# Patient Record
Sex: Female | Born: 1945
Health system: Southern US, Community
[De-identification: ages and names within clinical notes are randomized; demographics above are authoritative.]

## PROBLEM LIST (undated history)

## (undated) DIAGNOSIS — E119 Type 2 diabetes mellitus without complications: Secondary | ICD-10-CM

## (undated) DIAGNOSIS — D649 Anemia, unspecified: Secondary | ICD-10-CM

## (undated) DIAGNOSIS — H269 Unspecified cataract: Secondary | ICD-10-CM

## (undated) DIAGNOSIS — K219 Gastro-esophageal reflux disease without esophagitis: Secondary | ICD-10-CM

## (undated) DIAGNOSIS — T7840XA Allergy, unspecified, initial encounter: Secondary | ICD-10-CM

## (undated) DIAGNOSIS — Z86718 Personal history of other venous thrombosis and embolism: Secondary | ICD-10-CM

## (undated) DIAGNOSIS — K449 Diaphragmatic hernia without obstruction or gangrene: Secondary | ICD-10-CM

## (undated) DIAGNOSIS — R079 Chest pain, unspecified: Secondary | ICD-10-CM

## (undated) DIAGNOSIS — E785 Hyperlipidemia, unspecified: Secondary | ICD-10-CM

## (undated) DIAGNOSIS — R002 Palpitations: Secondary | ICD-10-CM

## (undated) DIAGNOSIS — I499 Cardiac arrhythmia, unspecified: Secondary | ICD-10-CM

## (undated) DIAGNOSIS — I10 Essential (primary) hypertension: Secondary | ICD-10-CM

## (undated) HISTORY — PX: EYE SURGERY: SHX253

## (undated) HISTORY — DX: Unspecified cataract: H26.9

## (undated) HISTORY — PX: APPENDECTOMY: SHX54

## (undated) HISTORY — DX: Anemia, unspecified: D64.9

## (undated) HISTORY — PX: VAGINAL HYSTERECTOMY: SUR661

## (undated) HISTORY — DX: Essential (primary) hypertension: I10

## (undated) HISTORY — DX: Chest pain, unspecified: R07.9

## (undated) HISTORY — PX: ROOT CANAL: SHX2363

## (undated) HISTORY — DX: Palpitations: R00.2

## (undated) HISTORY — DX: Hyperlipidemia, unspecified: E78.5

## (undated) HISTORY — DX: Allergy, unspecified, initial encounter: T78.40XA

## (undated) HISTORY — PX: CATARACT EXTRACTION, BILATERAL: SHX1313

## (undated) HISTORY — DX: Diaphragmatic hernia without obstruction or gangrene: K44.9

## (undated) HISTORY — PX: ABDOMINAL EXPLORATION SURGERY: SHX538

## (undated) HISTORY — DX: Personal history of other venous thrombosis and embolism: Z86.718

## (undated) HISTORY — DX: Gastro-esophageal reflux disease without esophagitis: K21.9

## (undated) HISTORY — DX: Type 2 diabetes mellitus without complications: E11.9

## (undated) HISTORY — DX: Cardiac arrhythmia, unspecified: I49.9

---

## 1998-02-03 ENCOUNTER — Other Ambulatory Visit: Admission: RE | Admit: 1998-02-03 | Discharge: 1998-02-03 | Payer: Self-pay | Admitting: Obstetrics and Gynecology

## 1999-02-08 ENCOUNTER — Other Ambulatory Visit: Admission: RE | Admit: 1999-02-08 | Discharge: 1999-02-08 | Payer: Self-pay | Admitting: Obstetrics and Gynecology

## 2000-03-13 ENCOUNTER — Other Ambulatory Visit: Admission: RE | Admit: 2000-03-13 | Discharge: 2000-03-13 | Payer: Self-pay | Admitting: Obstetrics and Gynecology

## 2001-04-02 ENCOUNTER — Other Ambulatory Visit: Admission: RE | Admit: 2001-04-02 | Discharge: 2001-04-02 | Payer: Self-pay | Admitting: Obstetrics and Gynecology

## 2003-07-02 ENCOUNTER — Encounter: Payer: Self-pay | Admitting: Cardiovascular Disease

## 2008-12-11 ENCOUNTER — Ambulatory Visit (HOSPITAL_COMMUNITY): Admission: RE | Admit: 2008-12-11 | Discharge: 2008-12-11 | Payer: Self-pay | Admitting: Gastroenterology

## 2009-02-21 ENCOUNTER — Emergency Department (HOSPITAL_COMMUNITY): Admission: EM | Admit: 2009-02-21 | Discharge: 2009-02-21 | Payer: Self-pay | Admitting: Emergency Medicine

## 2009-02-21 ENCOUNTER — Encounter: Payer: Self-pay | Admitting: Cardiovascular Disease

## 2009-02-23 ENCOUNTER — Encounter: Payer: Self-pay | Admitting: Cardiovascular Disease

## 2010-04-14 ENCOUNTER — Encounter: Payer: Self-pay | Admitting: Cardiovascular Disease

## 2010-04-14 ENCOUNTER — Ambulatory Visit
Admission: RE | Admit: 2010-04-14 | Discharge: 2010-04-14 | Payer: Self-pay | Source: Home / Self Care | Attending: Cardiovascular Disease | Admitting: Cardiovascular Disease

## 2010-04-14 DIAGNOSIS — L2989 Other pruritus: Secondary | ICD-10-CM | POA: Insufficient documentation

## 2010-04-14 DIAGNOSIS — L298 Other pruritus: Secondary | ICD-10-CM | POA: Insufficient documentation

## 2010-04-14 DIAGNOSIS — E785 Hyperlipidemia, unspecified: Secondary | ICD-10-CM | POA: Insufficient documentation

## 2010-04-15 ENCOUNTER — Encounter: Payer: Self-pay | Admitting: Cardiovascular Disease

## 2010-04-15 ENCOUNTER — Ambulatory Visit: Admission: RE | Admit: 2010-04-15 | Discharge: 2010-04-15 | Payer: Self-pay | Source: Home / Self Care

## 2010-04-20 LAB — CONVERTED CEMR LAB
Albumin: 4.3 g/dL (ref 3.5–5.2)
Alkaline Phosphatase: 62 units/L (ref 39–117)
HDL: 39 mg/dL — ABNORMAL LOW (ref >39)
Total Bilirubin: 0.5 mg/dL (ref 0.3–1.2)

## 2010-04-29 NOTE — Assessment & Plan Note (Signed)
Summary: NP6/AMD   Visit Type:  Initial Consult Referring Provider:  Dr. Elsie Lincoln  CC:  Establish care with local cardilogist.  Has shortness of breath when walking up a hill.  Has a lot of itching; not sure if coming from medication.  Former patient at [facility].Marland Kitchen  History of Present Illness: Dawn Herring is a very pleasant 65 year old woman with past medical history of hypertension, hyperlipidemia, with remote history of chest pain and tachycardia palpitations, last seen by myself in November 2010 at [facility], who presents to reestablish care.  Her main reason for presenting today is for new symptoms of itching. she believes it could be one of her medications. She does not have any new medications and has been on the same regimen for one year. She has not been taking any cholesterol medication despite a cholesterol typically greater than 300. She wonders if it is a "bad batch "of one of her generic medications.  She denies any shortness of breath, chest pain, lightheadedness. She does some exercise but would not a regular regimen. She continues to battle with her weight.  EKG shows sinus bradycardia with rate 53 beats per minute, diffuse T wave abnormality in the anterior precordial leads, one and aVL  Preventive Screening-Counseling & Management  Alcohol-Tobacco     Smoking Status: never  Caffeine-Diet-Exercise     Does Patient Exercise: no      Drug Use:  no.    Current Medications (verified): 1)  Aspir-Low 81 Mg Tbec (Aspirin) .... One Tablet Once Daily 2)  Losartan Potassium-Hctz 100-25 Mg Tabs (Losartan Potassium-Hctz) .... One Tablet Once Daily 3)  Metoprolol Tartrate 25 Mg Tabs (Metoprolol Tartrate) .... One Tablet Once Daily 4)  Acidophilus  Caps (Lactobacillus) .... One Tablet Once Daily 5)  Dexilant 60 Mg Cpdr (Dexlansoprazole) .... One Tablet Once Daily  Allergies (verified): 1)  ! * Statins  Past History:  Family History: Last updated:  04/14/2010 Father: Mother:  Social History: Last updated: 04/14/2010 Married  Retired  Tobacco Use - No.  Alcohol Use - no Regular Exercise - no Drug Use - no  Risk Factors: Exercise: no (04/14/2010)  Risk Factors: Smoking Status: never (04/14/2010)  Past Medical History: Hyperlipidemia Hypertension  Past Surgical History: hysterectomy  Family History: Father: Mother:  Social History: Married  Retired  Tobacco Use - No.  Alcohol Use - no Regular Exercise - no Drug Use - no Smoking Status:  never Does Patient Exercise:  no Drug Use:  no  Review of Systems  The patient denies fever, weight loss, weight gain, vision loss, decreased hearing, hoarseness, chest pain, syncope, dyspnea on exertion, peripheral edema, prolonged cough, abdominal pain, incontinence, muscle weakness, depression, and enlarged lymph nodes.         itching  Vital Signs:  Patient profile:   65 year old female Height:      66 inches Weight:      191 pounds BMI:     30.94 Pulse rate:   53 / minute BP sitting:   126 / 77  (left arm) Cuff size:   regular  Vitals Entered By: Bishop Dublin, CMA (April 14, 2010 3:24 PM)  Physical Exam  General:  Well developed, well nourished, in no acute distress. Head:  normocephalic and atraumatic Neck:  Neck supple, no JVD. No masses, thyromegaly or abnormal cervical nodes. Lungs:  Clear bilaterally to auscultation and percussion. Heart:  Non-displaced PMI, chest non-tender; regular rate and rhythm, S1, S2 without murmurs, rubs or  gallops. Carotid upstroke normal, no bruit.  Pedals normal pulses. No edema, no varicosities. Abdomen:  Bowel sounds positive; abdomen soft and non-tender without masses, obese Msk:  Back normal, normal gait. Muscle strength and tone normal. Pulses:  pulses normal in all 4 extremities Extremities:  No clubbing or cyanosis. Neurologic:  Alert and oriented x 3. Skin:  Intact without lesions or rashes. Psych:  Normal  affect.   Impression & Recommendations:  Problem # 1:  OTHER SPECIFIED PRURITIC CONDITIONS (ICD-698.8) she is concerned that her itching is coming from one of her medications. We have suggested that she hold her metoprolol and take this p.r.n. for tachycardia palpitations. We have also given her samples of Diovan 160/25 mg daily for blood pressure in place of losartan HCT. If she continues to have itching with both of these changes, her itching could be from an alternate cause that is not medication related.  Problem # 2:  HYPERLIPIDEMIA (ICD-272.4) We have given her an order slip to have her cholesterol checked at her convenience. She has suggested trying lovastatin as her mother tolerated this adequately. She has not tolerated Lipitor or Crestor in the past.  Orders: T-Hepatic Function [id number]) T-Lipid Profile [phone])  Patient Instructions: 1)  Your physician recommends that you schedule a follow-up appointment in: 1 year 2)  Your physician has recommended you make the following change in your medication: CHANGE Metoprolol to as needed only.  3)  Call our office to notify if samples of Diovan will work and we will call in prescription. Prescriptions: DIOVAN HCT 160-25 MG TABS (VALSARTAN-HYDROCHLOROTHIAZIDE) Take one tablet by mouth once daily.  #28 x 0   Entered by:   Lanny Hurst RN   Authorized by:   Dossie Arbour MD   Signed by:   Lanny Hurst RN on 04/14/2010   Method used:   Samples Given   RxID:   [id number]   Appended Document: NP6/AMD Echocardiogram from 2005 is essentially normal.  Stress test April 2005 shows no ischemia. This was a Persantine study

## 2010-05-05 NOTE — Letter (Signed)
Summary: [facility] 2D Echo  [facility] 2D Echo   Imported By: Roderic Ovens 04/30/2010 15:50:29  _____________________________________________________________________  External Attachment:    Type:   Image     Comment:   External Document

## 2010-05-05 NOTE — Letter (Signed)
Summary: [facility] Office Note   [facility] Note   Imported By: Roderic Ovens 04/30/2010 15:51:21  _____________________________________________________________________  External Attachment:    Type:   Image     Comment:   External Document

## 2010-05-10 ENCOUNTER — Telehealth: Payer: Self-pay | Admitting: Cardiovascular Disease

## 2010-05-19 NOTE — Progress Notes (Signed)
Summary: Diovan Rx  Phone Note Call from Patient   Caller: Patient Summary of Call: Pt called stating the Diovan HCT 160-25mg  samples worked well and pt needs rx.     Prescriptions: DIOVAN HCT 160-25 MG TABS (VALSARTAN-HYDROCHLOROTHIAZIDE) Take one tablet by mouth once daily.  #30 x 6   Entered by:   Lanny Hurst RN   Authorized by:   Dossie Arbour MD   Signed by:   Lanny Hurst RN on 05/10/2010   Method used:   Electronically to        [facility] [phone]* (retail)       [address]       [place], [place]  [address]       Ph: [phone]       Fax: [phone]   RxID:   [id number]

## 2010-06-11 ENCOUNTER — Telehealth: Payer: Self-pay | Admitting: Cardiovascular Disease

## 2010-06-14 ENCOUNTER — Telehealth: Payer: Self-pay | Admitting: Cardiovascular Disease

## 2010-06-15 ENCOUNTER — Telehealth: Payer: Self-pay | Admitting: Cardiovascular Disease

## 2010-06-15 ENCOUNTER — Other Ambulatory Visit: Payer: Self-pay

## 2010-06-15 NOTE — Telephone Encounter (Signed)
Phone Note  Call from Patient Call back at [facility] Phone [phone]   Caller: Self Call For: Gollan Summary of Call: Pt is calling back about her previous message regarding medication.  It appears that Mariah Milling has responded and signed without sending to [place].  Please advise. Initial call taken by: Harlon Flor,  June 14, 2010 10:11 AM  Follow-up for Phone Call         LMOM TCB. Follow-up by: Bishop Dublin, CMA,  June 14, 2010 3:32 PM

## 2010-06-15 NOTE — Telephone Encounter (Signed)
The patient needs to start on Micardis, avapro or twynsta before authorized to take diovan hct.  The patient needs to have failed two other medications before approval for diovan. Please advise which of the three above medications to order. Even though the patient tried generic losartan the insurance still will not approve the diovan hct.

## 2010-06-16 ENCOUNTER — Telehealth: Payer: Self-pay

## 2010-06-16 MED ORDER — TELMISARTAN-HCTZ 40-12.5 MG PO TABS: 1.0000 | ORAL_TABLET | Freq: Every day | ORAL | Status: DC

## 2010-06-16 NOTE — Telephone Encounter (Signed)
Addended by: Bishop Dublin on: 06/16/2010 03:06 PM   Modules accepted: Orders

## 2010-06-16 NOTE — Telephone Encounter (Signed)
Notified patient waiting to hear back from Dr. Mariah Milling on which medication he would like to start her on since the patient needs to have tried either micardis,avapro or twynsta since diovan not authorized.

## 2010-06-16 NOTE — Telephone Encounter (Signed)
Would try micardis-HCT 40-25 mg daily instead of diovan HCT

## 2010-06-16 NOTE — Telephone Encounter (Signed)
Addended by: Bishop Dublin on: 06/16/2010 05:30 PM   Modules accepted: Orders

## 2010-06-16 NOTE — Telephone Encounter (Signed)
PATIENT CALLED BACK AND LEFT MESSAGE ON VOICEMAIL FOR SHARON, PATIENT TALKED WITH INSURANCE COMPANY AND THEY DENIED DIOVAN.  THEY WOULD LIKE HER TO TRY TWO OTHER DRUGS BEFORE THEY WILL APPROVE. PLEASE GIVE HER A CALL AT [phone]

## 2010-06-18 NOTE — Telephone Encounter (Signed)
Dawn Herring, was this already taken care of? Or do I need to talk to call pt back? Thanks, Magazine features editor.

## 2010-06-24 NOTE — Progress Notes (Signed)
Summary: RX  Phone Note Call from Patient Call back at Home Phone [phone]   Caller: Self Call For: Dawn Herring Summary of Call: Pt needs the insurance company to be notified that it is medically necessary to be on Diovan.  RX company will not refill it if we do not notify them of this.  Pt states that she faxed over a form several days ago to take care of this and the insurance company states that they have not r'cvd it.  Please call [facility] @ [phone].  Please call the pt once this has been completed. Initial call taken by: Harlon Flor,  June 11, 2010 2:52 PM  Follow-up for Phone Call        Uncertain if her previous rash/itch was from losartan? Does she want to try again with losartan? If not, we can try to put paperwork through to see if it will be covered.

## 2010-06-24 NOTE — Progress Notes (Signed)
Summary: Message  Phone Note Call from Patient Call back at Home Phone [phone]   Caller: Self Call For: Dawn Herring Summary of Call: Pt is calling back about her previous message regarding medication.  It appears that Mariah Milling has responded and signed without sending to [place].  Please advise. Initial call taken by: Harlon Flor,  June 14, 2010 10:11 AM  Follow-up for Phone Call        LMOM TCB. Follow-up by: Bishop Dublin, CMA,  June 14, 2010 3:32 PM  Additional Follow-up for Phone Call Additional follow up Details #1::        Phone note copied and sent to epic, will complete note in epic. Lanny Hurst RN  June 16, 2010 12:31 PM

## 2010-06-30 LAB — COMPREHENSIVE METABOLIC PANEL
Alkaline Phosphatase: 75 U/L (ref 39–117)
BUN: 11 mg/dL (ref 6–23)
Chloride: 99 mEq/L (ref 96–112)
GFR calc non Af Amer: >60 mL/min (ref >60)
Glucose, Bld: 112 mg/dL — ABNORMAL HIGH (ref 70–99)
Potassium: 3.4 mEq/L — ABNORMAL LOW (ref 3.5–5.1)
Total Bilirubin: 0.6 mg/dL (ref 0.3–1.2)
Total Protein: 7.4 g/dL (ref 6.0–8.3)

## 2010-06-30 LAB — DIFFERENTIAL
Basophils Absolute: 0 10*3/uL (ref 0.0–0.1)
Basophils Relative: 0 % (ref 0–1)
Neutro Abs: 3 10*3/uL (ref 1.7–7.7)
Neutrophils Relative %: 58 % (ref 43–77)

## 2010-06-30 LAB — URINALYSIS, ROUTINE W REFLEX MICROSCOPIC
Bilirubin Urine: NEGATIVE
Glucose, UA: NEGATIVE mg/dL
Ketones, ur: NEGATIVE mg/dL
pH: 7 (ref 5.0–8.0)

## 2010-06-30 LAB — POCT CARDIAC MARKERS
CKMB, poc: 2.1 ng/mL (ref 1.0–8.0)
Myoglobin, poc: 80.2 ng/mL (ref 12–200)
Troponin i, poc: <0.05 ng/mL (ref 0.00–0.09)

## 2010-06-30 LAB — CBC
HCT: 39.9 % (ref 36.0–46.0)
Hemoglobin: 13.9 g/dL (ref 12.0–15.0)
RDW: 12.8 % (ref 11.5–15.5)

## 2010-06-30 LAB — PROTIME-INR: INR: 0.89 (ref 0.00–1.49)

## 2010-07-20 ENCOUNTER — Other Ambulatory Visit (INDEPENDENT_AMBULATORY_CARE_PROVIDER_SITE_OTHER): Payer: Medicare Other | Admitting: *Deleted

## 2010-07-20 DIAGNOSIS — E785 Hyperlipidemia, unspecified: Secondary | ICD-10-CM

## 2010-07-20 LAB — LIPID PANEL
HDL: 45 mg/dL (ref >39)
Triglycerides: 308 mg/dL — ABNORMAL HIGH (ref <150)

## 2010-07-20 LAB — HEPATIC FUNCTION PANEL
AST: 19 U/L (ref 0–37)
Albumin: 4.1 g/dL (ref 3.5–5.2)
Alkaline Phosphatase: 58 U/L (ref 39–117)
Indirect Bilirubin: 0.4 mg/dL (ref 0.0–0.9)
Total Bilirubin: 0.5 mg/dL (ref 0.3–1.2)

## 2010-07-28 ENCOUNTER — Encounter: Payer: Self-pay | Admitting: Cardiovascular Disease

## 2010-09-03 ENCOUNTER — Encounter: Payer: Self-pay | Admitting: Cardiovascular Disease

## 2010-09-03 ENCOUNTER — Ambulatory Visit (INDEPENDENT_AMBULATORY_CARE_PROVIDER_SITE_OTHER): Payer: Medicare Other | Admitting: Cardiovascular Disease

## 2010-09-03 DIAGNOSIS — L298 Other pruritus: Secondary | ICD-10-CM

## 2010-09-03 DIAGNOSIS — E669 Obesity, unspecified: Secondary | ICD-10-CM

## 2010-09-03 DIAGNOSIS — R9431 Abnormal electrocardiogram [ECG] [EKG]: Secondary | ICD-10-CM | POA: Insufficient documentation

## 2010-09-03 DIAGNOSIS — I10 Essential (primary) hypertension: Secondary | ICD-10-CM

## 2010-09-03 DIAGNOSIS — E785 Hyperlipidemia, unspecified: Secondary | ICD-10-CM | POA: Insufficient documentation

## 2010-09-03 NOTE — Progress Notes (Signed)
   Patient ID: Dawn Herring, female    DOB: June 10, 1945, 65 y.o.   MRN: [id number]  HPI Comments: Dawn Herring is a very pleasant 65 year old woman with past medical history of hypertension, hyperlipidemia, with remote history of chest pain and tachycardia palpitations, Previous stress test in 2005, also in 2008 or 2009 while at [facility] vascular [facility], Who presents for routine followup   She reports that she was recently denied from long-term care insurance. She was noted to have ST and T wave abnormality in the anterolateral leads. This was noted on previous EKGs dating back numerous years. She denies any recent symptoms of shortness of breath or chest discomfort. She had this EKG change at the time of a stress test in the past.    She does some exercise but would not a regular regimen. She continues to battle with her weight.   EKG shows sinus bradycardia with rate 74 beats per minute, diffuse T wave abnormality in the anterolateral V4 to V6, leads, one and aVL      Review of Systems  Constitutional: Negative.   HENT: Negative.   Eyes: Negative.   Respiratory: Negative.   Cardiovascular: Negative.   Gastrointestinal: Negative.   Musculoskeletal: Negative.   Skin: Negative.   Neurological: Negative.   Hematological: Negative.   Psychiatric/Behavioral: Negative.   All other systems reviewed and are negative.    BP 131/84  Pulse 74  Ht 5\' 7"  (1.702 m)  Wt 189 lb (85.73 kg)  BMI 29.60 kg/m2  Physical Exam  Nursing note and vitals reviewed. Constitutional: She is oriented to person, place, and time. She appears well-developed and well-nourished.       obese  HENT:  Head: Normocephalic.  Nose: Nose normal.  Mouth/Throat: Oropharynx is clear and moist.  Eyes: Conjunctivae are normal. Pupils are equal, round, and reactive to light.  Neck: Normal range of motion. Neck supple. No JVD present.  Cardiovascular: Normal rate, regular rhythm, S1 normal, S2 normal and  intact distal pulses.  Exam reveals no gallop and no friction rub.   Murmur heard.  Crescendo systolic murmur is present with a grade of 1/6  Pulmonary/Chest: Effort normal and breath sounds normal. No respiratory distress. She has no wheezes. She has no rales. She exhibits no tenderness.  Abdominal: Soft. Bowel sounds are normal. She exhibits no distension. There is no tenderness.  Musculoskeletal: Normal range of motion. She exhibits no edema and no tenderness.  Lymphadenopathy:    She has no cervical adenopathy.  Neurological: She is alert and oriented to person, place, and time. Coordination normal.  Skin: Skin is warm and dry. No rash noted. No erythema.  Psychiatric: She has a normal mood and affect. Her behavior is normal. Judgment and thought content normal.         Assessment and Plan

## 2010-09-03 NOTE — Patient Instructions (Addendum)
You are doing well. Try red yeast rice Please call us if you have new issues that need to be addressed before your next appt.  We will call you for a follow up Appt. In 12 months

## 2010-09-05 DIAGNOSIS — E669 Obesity, unspecified: Secondary | ICD-10-CM | POA: Insufficient documentation

## 2010-09-05 DIAGNOSIS — I10 Essential (primary) hypertension: Secondary | ICD-10-CM | POA: Insufficient documentation

## 2010-09-05 NOTE — Assessment & Plan Note (Addendum)
She has had a difficult time tolerating statins. We have asked her to work on her weight. Currently she does not take lovastatin secondary to muscle ache. We will discuss with her whether she would like to try red yeast rice with zetia.

## 2010-09-05 NOTE — Assessment & Plan Note (Signed)
She was unable to tolerate losartan in the past secondary to possible itching. She is tolerating my card is better though does continue to have mild itching.

## 2010-09-05 NOTE — Assessment & Plan Note (Addendum)
She does have an abnormal EKG though this has been noted for numerous years, even during the time when she had a normal stress test that showed no ischemia. She did have a stress test in 2005. She reports having one after that but we do not have this for our records.  We will try to obtain the old EKGs for verification.

## 2010-09-05 NOTE — Assessment & Plan Note (Signed)
We have encouraged continued exercise, careful diet management in an effort to lose weight. 

## 2010-09-07 ENCOUNTER — Telehealth: Payer: Self-pay | Admitting: *Deleted

## 2010-09-07 NOTE — Telephone Encounter (Signed)
Called pt to let her know per Dr. Mariah Milling her TC was >300. She has had muscle ache with lovastatin in past, and does she want to try other medication? Dr. Mariah Milling recc Zetia and Red Yeast Rice. LMOM TCB.

## 2010-09-10 ENCOUNTER — Other Ambulatory Visit: Payer: Self-pay | Admitting: Cardiovascular Disease

## 2010-09-10 MED ORDER — RED YEAST RICE 600 MG PO CAPS: 1.0000 | ORAL_CAPSULE | Freq: Every day | ORAL | Status: DC

## 2010-09-10 MED ORDER — EZETIMIBE 10 MG PO TABS: 10.0000 mg | ORAL_TABLET | Freq: Every day | ORAL | Status: DC

## 2010-09-10 NOTE — Telephone Encounter (Signed)
Spoke to pt, notified of msg below. She will start Zetia along with Red Yeast Rice. Have sent in Rx. Pt will call with any problems after taking med.

## 2010-09-14 ENCOUNTER — Telehealth: Payer: Self-pay | Admitting: Cardiovascular Disease

## 2010-09-14 NOTE — Telephone Encounter (Signed)
Pt is applying for Long Term [employer] and was denied bc of an old EKG.  Dawn Herring has stated that he would be willing to write a letter to the company in regards to this EKG.  AttnElliot Dally Fax # [phone]. Please call the pt in regards to this.

## 2010-09-15 NOTE — Telephone Encounter (Signed)
Attempted to contact pt, LMOM TCB. Dr. Mariah Milling, please advise.

## 2010-09-16 NOTE — Telephone Encounter (Signed)
We have pt's stress thallium from 2005 from [facility] cardiology. Pt does want Dr. Mariah Milling to write letter to insurance. Notified pt will discuss with him after he returns to office. Pt fine with this.

## 2010-09-28 ENCOUNTER — Telehealth: Payer: Self-pay | Admitting: Cardiovascular Disease

## 2010-09-28 NOTE — Telephone Encounter (Signed)
The insurance company is calling in regards to the letter that was requested on 09/14/10 discussing an EKG that caused the patient to be denied for long term insurance.  I explained to the insurance agent that the letter was first requested on 6/19 and that Dr. Mariah Milling was off for a week since that was requested.  The agent proceeded to argue with me about when the letter was requested, I only saw a phone note regarding this on 6/19.  The agent then argued that Dr. Mariah Milling has been back from vacation for 1 week and does not understand why the letter has still not been written.  Please advise the patient as to when this letter will be complete and please fax to [place] at [employer].  The fax number was included in a previous phone note.

## 2010-09-28 NOTE — Telephone Encounter (Signed)
Dr. Mariah Milling, please advise, and I can call pt letting her know if we are going to send letter. Thanks.

## 2010-09-29 ENCOUNTER — Encounter: Payer: Self-pay | Admitting: Cardiovascular Disease

## 2010-09-29 NOTE — Telephone Encounter (Signed)
Sorry he was so rude. Letter has been written. That will teach me for putting active patient care first and letters to insurance co. Second.  Shame on me

## 2010-09-30 NOTE — Telephone Encounter (Signed)
Pt notified, and is fine with everything. She requested letter be mailed to her, I have mailed to her and she states she will give to her insurance company.

## 2011-01-19 ENCOUNTER — Other Ambulatory Visit: Payer: Self-pay | Admitting: Cardiovascular Disease

## 2011-01-19 NOTE — Telephone Encounter (Signed)
Refill sent for micardis hct 40/12.5 mg daily.

## 2011-03-01 ENCOUNTER — Telehealth: Payer: Self-pay | Admitting: Cardiovascular Disease

## 2011-03-01 NOTE — Telephone Encounter (Signed)
Pt calling stating that she thinks she is having a reaction to one of her meds. She has been itching really bad. She is not sure which med it maybe `

## 2011-03-01 NOTE — Telephone Encounter (Signed)
Spoke to pt, she states itching and rash has lasted 3 weeks. Rash is all over, both legs/arms, back, abdomen, and is "worse at night." She has been taking Benadryl since this started and it does relieve the itching so she can sleep, but symptoms keep returning. Pt's only allergy is statins, but does state she is "sensitive to many things." She has not changed detergents or anything she comes in contact with recently that she is aware of. Cardiac meds are Micardis-

## 2011-03-01 NOTE — Telephone Encounter (Signed)
**  Continue from below** Only cardiac meds are Micardis and she has been on this for 1 year with no trouble, and takes Zetia. Everything else is OTC and nothing is new. Pt does not have PCP. I advised her to see urgent care to assess rash and help r/o cause for rxn. Pt states she will see urgent care and will call us back with any additional problems. I asked pt to call back and f/u with the results.

## 2011-03-16 ENCOUNTER — Ambulatory Visit (INDEPENDENT_AMBULATORY_CARE_PROVIDER_SITE_OTHER): Payer: Medicare Other | Admitting: Cardiovascular Disease

## 2011-03-16 ENCOUNTER — Encounter: Payer: Self-pay | Admitting: Cardiovascular Disease

## 2011-03-16 VITALS — BP 140/80 | HR 65 | Ht 67.0 in | Wt 194.0 lb

## 2011-03-16 DIAGNOSIS — E669 Obesity, unspecified: Secondary | ICD-10-CM

## 2011-03-16 DIAGNOSIS — E785 Hyperlipidemia, unspecified: Secondary | ICD-10-CM

## 2011-03-16 DIAGNOSIS — I10 Essential (primary) hypertension: Secondary | ICD-10-CM

## 2011-03-16 MED ORDER — VALSARTAN-HYDROCHLOROTHIAZIDE 160-25 MG PO TABS: 1.0000 | ORAL_TABLET | Freq: Every day | ORAL | Status: DC

## 2011-03-16 MED ORDER — FLUVASTATIN SODIUM 40 MG PO CAPS: 40.0000 mg | ORAL_CAPSULE | Freq: Every day | ORAL | Status: DC

## 2011-03-16 NOTE — Assessment & Plan Note (Signed)
We have encouraged continued exercise, careful diet management in an effort to lose weight. 

## 2011-03-16 NOTE — Progress Notes (Signed)
   Patient ID: Dawn Herring, female    DOB: 1945-08-31, 65 y.o.   MRN: [id number]  HPI Comments: Ms. Rafuse is a very pleasant 65 year old woman with past medical history of hypertension, hyperlipidemia, with remote history of chest pain and tachycardia palpitations, Previous stress test in 2005, also in 2008 or 2009 while at [facility] heart [facility] vascular [facility], Who presents for routine followup   She reports that she has had a difficult time tolerating statins. She has tried various medications and has muscle ache. She had GI upset on red yeast rice. She has been able to tolerate zetia 10 mg daily. No recent cholesterol check. Her weight continues to be a issue. Otherwise she feels well and has no complaints.   EKG shows sinus bradycardia with rate 65 beats per minute, diffuse T wave abnormality in the anterolateral V4 to V6, leads, one and aVL    Outpatient Encounter Prescriptions as of 03/16/2011  Medication Sig Dispense Refill  . aspirin 81 MG tablet Take 81 mg by mouth daily.        Marland Kitchen ezetimibe (ZETIA) 10 MG tablet Take 1 tablet (10 mg total) by mouth daily.  30 tablet  6  . Lactobacillus (ACIDOPHILUS) CAPS Take by mouth.        Marland Kitchen MICARDIS HCT 40-12.5 MG per tablet TAKE 1 TABLET BY MOUTH EVERY DAY  30 tablet  6  . omeprazole (PRILOSEC) 20 MG capsule Take 20 mg by mouth daily.          Review of Systems  Constitutional: Negative.   HENT: Negative.   Eyes: Negative.   Respiratory: Negative.   Cardiovascular: Negative.   Gastrointestinal: Negative.   Musculoskeletal: Negative.   Skin: Negative.   Neurological: Negative.   Hematological: Negative.   Psychiatric/Behavioral: Negative.   All other systems reviewed and are negative.    BP 140/80  Pulse 65  Ht 5\' 7"  (1.702 m)  Wt 194 lb (87.998 kg)  BMI 30.38 kg/m2  Physical Exam  Nursing note and vitals reviewed. Constitutional: She is oriented to person, place, and time. She appears well-developed and well-nourished.    obese  HENT:  Head: Normocephalic.  Nose: Nose normal.  Mouth/Throat: Oropharynx is clear and moist.  Eyes: Conjunctivae are normal. Pupils are equal, round, and reactive to light.  Neck: Normal range of motion. Neck supple. No JVD present.  Cardiovascular: Normal rate, regular rhythm, S1 normal, S2 normal and intact distal pulses.  Exam reveals no gallop and no friction rub.   Murmur heard.  Crescendo systolic murmur is present with a grade of 1/6  Pulmonary/Chest: Effort normal and breath sounds normal. No respiratory distress. She has no wheezes. She has no rales. She exhibits no tenderness.  Abdominal: Soft. Bowel sounds are normal. She exhibits no distension. There is no tenderness.  Musculoskeletal: Normal range of motion. She exhibits no edema and no tenderness.  Lymphadenopathy:    She has no cervical adenopathy.  Neurological: She is alert and oriented to person, place, and time. Coordination normal.  Skin: Skin is warm and dry. No rash noted. No erythema.  Psychiatric: She has a normal mood and affect. Her behavior is normal. Judgment and thought content normal.         Assessment and Plan

## 2011-03-16 NOTE — Assessment & Plan Note (Signed)
We have asked her to monitor her blood pressure at home. She is interested in changing back to Diovan now that it is generic. We will start her on Diovan HCT 160/25 mg daily. She does report slightly increased edema.

## 2011-03-16 NOTE — Patient Instructions (Addendum)
You are doing well. Please start fluvastatin 1/2 tab once a day for cholesterol  Please change micardis to diovan/HCT once a day for blood pressure Come in for labs on Friday (fasting)  Please call us if you have new issues that need to be addressed before your next appt.   Your physician wants you to follow-up in: 12 months.  You will receive a reminder letter in the mail two months in advance. If you don't receive a letter, please call our office to schedule the follow-up appointment.

## 2011-03-16 NOTE — Assessment & Plan Note (Signed)
We will have her come in for a lipid panel. I suspect her numbers will be elevated. We have suggested if they continue to be high, she start fluvastatin 20 mg daily.

## 2011-03-18 ENCOUNTER — Ambulatory Visit (INDEPENDENT_AMBULATORY_CARE_PROVIDER_SITE_OTHER): Payer: Medicare Other | Admitting: *Deleted

## 2011-03-18 DIAGNOSIS — E785 Hyperlipidemia, unspecified: Secondary | ICD-10-CM

## 2011-03-19 LAB — LIPID PANEL
Cholesterol, Total: 275 mg/dL — ABNORMAL HIGH (ref 100–199)
Triglycerides: 335 mg/dL — ABNORMAL HIGH (ref 0–149)

## 2011-03-29 HISTORY — PX: ROOT CANAL: SHX2363

## 2011-04-22 ENCOUNTER — Other Ambulatory Visit: Payer: Self-pay | Admitting: Cardiovascular Disease

## 2011-04-22 NOTE — Telephone Encounter (Signed)
Refill sent for zetia.  

## 2011-10-17 ENCOUNTER — Other Ambulatory Visit: Payer: Self-pay

## 2011-10-17 DIAGNOSIS — I2510 Atherosclerotic heart disease of native coronary artery without angina pectoris: Secondary | ICD-10-CM

## 2011-10-20 ENCOUNTER — Other Ambulatory Visit (INDEPENDENT_AMBULATORY_CARE_PROVIDER_SITE_OTHER): Payer: Medicare Other

## 2011-10-20 DIAGNOSIS — I2510 Atherosclerotic heart disease of native coronary artery without angina pectoris: Secondary | ICD-10-CM

## 2011-10-21 LAB — LIPID PANEL
Chol/HDL Ratio: 4.7 ratio units — ABNORMAL HIGH (ref 0.0–4.4)
HDL: 39 mg/dL — ABNORMAL LOW (ref >39)
LDL Calculated: 76 mg/dL (ref 0–99)
Triglycerides: 341 mg/dL — ABNORMAL HIGH (ref 0–149)

## 2011-10-21 LAB — HEPATIC FUNCTION PANEL
ALT: 22 IU/L (ref 0–32)
Total Protein: 7.2 g/dL (ref 6.0–8.5)

## 2011-10-27 ENCOUNTER — Ambulatory Visit (INDEPENDENT_AMBULATORY_CARE_PROVIDER_SITE_OTHER): Payer: Medicare Other | Admitting: Cardiovascular Disease

## 2011-10-27 ENCOUNTER — Encounter: Payer: Self-pay | Admitting: Cardiovascular Disease

## 2011-10-27 VITALS — BP 126/68 | HR 78 | Ht 67.0 in | Wt 193.5 lb

## 2011-10-27 DIAGNOSIS — E785 Hyperlipidemia, unspecified: Secondary | ICD-10-CM

## 2011-10-27 DIAGNOSIS — I10 Essential (primary) hypertension: Secondary | ICD-10-CM

## 2011-10-27 DIAGNOSIS — E669 Obesity, unspecified: Secondary | ICD-10-CM

## 2011-10-27 MED ORDER — VALSARTAN-HYDROCHLOROTHIAZIDE 160-25 MG PO TABS: 1.0000 | ORAL_TABLET | Freq: Every day | ORAL | Status: DC

## 2011-10-27 MED ORDER — EZETIMIBE 10 MG PO TABS: 10.0000 mg | ORAL_TABLET | Freq: Every day | ORAL | Status: DC

## 2011-10-27 MED ORDER — FLUVASTATIN SODIUM 40 MG PO CAPS: 40.0000 mg | ORAL_CAPSULE | Freq: Every day | ORAL | Status: DC

## 2011-10-27 NOTE — Progress Notes (Signed)
   Patient ID: Dawn Herring, female    DOB: 1945/12/07, 66 y.o.   MRN: [id number]  HPI Comments: [name] is a very pleasant 66 year old woman with past medical history of hypertension, hyperlipidemia, with remote history of chest pain and tachycardia palpitations, previous stress test in 2005, also in 2008 or 2009 while at [facility], Who presents for routine followup   She reports that she has had a recent root canal with abscess formation requiring antibiotics. Otherwise she feels well with no significant shortness of breath, chest pain, edema. She is active. She has been able to tolerate fluvastatin and zetia daily with no muscle ache.  Recent lab work shows total cholesterol 180, LDL 85   EKG shows sinus bradycardia with rate 78 beats per minute, diffuse T wave abnormality in the anterolateral V4 to V6, leads, one and aVL    Outpatient Encounter Prescriptions as of 10/27/2011  Medication Sig Dispense Refill  . aspirin 81 MG tablet Take 81 mg by mouth daily.        Marland Kitchen ezetimibe (ZETIA) 10 MG tablet Take 1 tablet (10 mg total) by mouth daily.  90 tablet  3  . fluvastatin (LESCOL) 40 MG capsule Take 1 capsule (40 mg total) by mouth at bedtime.  90 capsule  3  . Lactobacillus (ACIDOPHILUS) CAPS Take by mouth.        Marland Kitchen omeprazole (PRILOSEC) 20 MG capsule Take 20 mg by mouth daily.        . valsartan-hydrochlorothiazide (DIOVAN HCT) 160-25 MG per tablet Take 1 tablet by mouth daily.  90 tablet  4    Review of Systems  Constitutional: Negative.   HENT: Negative.   Eyes: Negative.   Respiratory: Negative.   Cardiovascular: Negative.   Gastrointestinal: Negative.   Musculoskeletal: Negative.   Skin: Negative.   Neurological: Negative.   Hematological: Negative.   Psychiatric/Behavioral: Negative.   All other systems reviewed and are negative.    BP 126/68  Pulse 78  Ht 5\' 7"  (1.702 m)  Wt 193 lb 8 oz (87.771 kg)  BMI 30.31 kg/m2  Physical Exam    Nursing note and vitals reviewed. Constitutional: She is oriented to person, place, and time. She appears well-developed and well-nourished.       obese  HENT:  Head: Normocephalic.  Nose: Nose normal.  Mouth/Throat: Oropharynx is clear and moist.  Eyes: Conjunctivae are normal. Pupils are equal, round, and reactive to light.  Neck: Normal range of motion. Neck supple. No JVD present.  Cardiovascular: Normal rate, regular rhythm, S1 normal, S2 normal and intact distal pulses.  Exam reveals no gallop and no friction rub.   Murmur heard.  Crescendo systolic murmur is present with a grade of 1/6  Pulmonary/Chest: Effort normal and breath sounds normal. No respiratory distress. She has no wheezes. She has no rales. She exhibits no tenderness.  Abdominal: Soft. Bowel sounds are normal. She exhibits no distension. There is no tenderness.  Musculoskeletal: Normal range of motion. She exhibits no edema and no tenderness.  Lymphadenopathy:    She has no cervical adenopathy.  Neurological: She is alert and oriented to person, place, and time. Coordination normal.  Skin: Skin is warm and dry. No rash noted. No erythema.  Psychiatric: She has a normal mood and affect. Her behavior is normal. Judgment and thought content normal.         Assessment and Plan

## 2011-10-27 NOTE — Patient Instructions (Addendum)
You are doing well. No medication changes were made.  Please call us if you have new issues that need to be addressed before your next appt.  Your physician wants you to follow-up in: 12 months.  You will receive a reminder letter in the mail two months in advance. If you don't receive a letter, please call our office to schedule the follow-up appointment. 

## 2011-10-27 NOTE — Assessment & Plan Note (Signed)
Cholesterol is dramatically better on her current medication regimen. We have suggested she stay on her current medications.

## 2011-10-27 NOTE — Assessment & Plan Note (Signed)
Blood pressure is well controlled on today's visit. No changes made to the medications. 

## 2011-10-27 NOTE — Assessment & Plan Note (Signed)
We have encouraged continued exercise, careful diet management in an effort to lose weight. 

## 2012-03-15 ENCOUNTER — Other Ambulatory Visit: Payer: Self-pay | Admitting: Cardiovascular Disease

## 2012-03-15 NOTE — Telephone Encounter (Signed)
Refilled Valsartan

## 2012-04-17 ENCOUNTER — Other Ambulatory Visit: Payer: Self-pay | Admitting: Cardiovascular Disease

## 2012-04-17 NOTE — Telephone Encounter (Signed)
Refilled Lescol.

## 2012-08-08 ENCOUNTER — Other Ambulatory Visit: Payer: Self-pay

## 2012-08-08 DIAGNOSIS — E785 Hyperlipidemia, unspecified: Secondary | ICD-10-CM

## 2012-10-15 ENCOUNTER — Other Ambulatory Visit: Payer: Medicare Other

## 2012-10-16 ENCOUNTER — Ambulatory Visit (INDEPENDENT_AMBULATORY_CARE_PROVIDER_SITE_OTHER): Payer: Medicare Other

## 2012-10-16 DIAGNOSIS — E785 Hyperlipidemia, unspecified: Secondary | ICD-10-CM

## 2012-10-17 LAB — LIPID PANEL
Chol/HDL Ratio: 5 ratio units — ABNORMAL HIGH (ref 0.0–4.4)
HDL: 48 mg/dL (ref >39)
LDL Calculated: 130 mg/dL — ABNORMAL HIGH (ref 0–99)
Triglycerides: 308 mg/dL — ABNORMAL HIGH (ref 0–149)

## 2012-10-17 LAB — HEPATIC FUNCTION PANEL
ALT: 14 IU/L (ref 0–32)
Total Protein: 7.1 g/dL (ref 6.0–8.5)

## 2012-10-26 ENCOUNTER — Ambulatory Visit: Payer: Medicare Other | Admitting: Cardiovascular Disease

## 2012-10-30 ENCOUNTER — Other Ambulatory Visit: Payer: Self-pay | Admitting: Cardiovascular Disease

## 2012-10-30 ENCOUNTER — Encounter: Payer: Self-pay | Admitting: Cardiovascular Disease

## 2012-10-30 ENCOUNTER — Ambulatory Visit (INDEPENDENT_AMBULATORY_CARE_PROVIDER_SITE_OTHER): Payer: Medicare Other | Admitting: Cardiovascular Disease

## 2012-10-30 VITALS — BP 114/76 | HR 57 | Ht 66.0 in | Wt 196.8 lb

## 2012-10-30 DIAGNOSIS — M79675 Pain in left toe(s): Secondary | ICD-10-CM

## 2012-10-30 DIAGNOSIS — M109 Gout, unspecified: Secondary | ICD-10-CM

## 2012-10-30 DIAGNOSIS — I10 Essential (primary) hypertension: Secondary | ICD-10-CM

## 2012-10-30 DIAGNOSIS — R21 Rash and other nonspecific skin eruption: Secondary | ICD-10-CM

## 2012-10-30 DIAGNOSIS — M7989 Other specified soft tissue disorders: Secondary | ICD-10-CM | POA: Insufficient documentation

## 2012-10-30 DIAGNOSIS — E785 Hyperlipidemia, unspecified: Secondary | ICD-10-CM

## 2012-10-30 DIAGNOSIS — E669 Obesity, unspecified: Secondary | ICD-10-CM

## 2012-10-30 DIAGNOSIS — M79609 Pain in unspecified limb: Secondary | ICD-10-CM

## 2012-10-30 NOTE — Assessment & Plan Note (Signed)
Blood pressure is well controlled on today's visit. No changes made to the medications. 

## 2012-10-30 NOTE — Progress Notes (Signed)
Patient ID: Dawn Herring, female    DOB: 09/10/1945, 67 y.o.   MRN: [id number]  HPI Comments: Dawn Herring is a very pleasant 67 year old woman with past medical history of hypertension, hyperlipidemia, with remote history of chest pain and tachycardia palpitations, previous stress test in 2005, also in 2008 or 2009 while at [facility], Who presents for routine followup   She reports that overall she is doing well. She's had some left foot swelling, occasional toe pain with redness in a joint concerning for gout. In the past she has been treated with colchicine, more recently given indomethacin by acute care. Uncertain if this helps her symptoms.  She continues to take Lescol and zetia.  Recent lipids show total cholesterol 240 which is to climb from previous numbers of 180. She is surprised by the increased as she has been taking her medications on a consistent basis.  She has been taking care of her father was 73, recent stroke. She sleeps with him 3-4 nights per week to take care of him She also reports having a periodic rash on her arms and legs and wonders if this could be from her medications  EKG shows sinus bradycardia with rate 57 beats per minute, diffuse T wave abnormality in the anterolateral V4 to V6, leads, one and aVL    Outpatient Encounter Prescriptions as of 10/30/2012  Medication Sig Dispense Refill  . aspirin 325 MG tablet Take 325 mg by mouth daily.      Marland Kitchen ezetimibe (ZETIA) 10 MG tablet Take 1 tablet (10 mg total) by mouth daily.  90 tablet  3  . fluvastatin (LESCOL) 40 MG capsule TAKE ONE CAPSULE BY MOUTH AT BEDTIME  30 capsule  3  . Lactobacillus (ACIDOPHILUS) CAPS Take by mouth.        Marland Kitchen omeprazole (PRILOSEC) 20 MG capsule Take 20 mg by mouth daily.        . valsartan-hydrochlorothiazide (DIOVAN HCT) 160-25 MG per tablet Take 1 tablet by mouth daily.  90 tablet  4  . [DISCONTINUED] aspirin 81 MG tablet Take 81 mg by mouth daily.        .  [DISCONTINUED] fluvastatin (LESCOL) 40 MG capsule Take 1 capsule (40 mg total) by mouth at bedtime.  90 capsule  3  . [DISCONTINUED] valsartan-hydrochlorothiazide (DIOVAN-HCT) 160-25 MG per tablet TAKE 1 TABLET BY MOUTH EVERY DAY  90 tablet  3   No facility-administered encounter medications on file as of 10/30/2012.     Review of Systems  Constitutional: Negative.   HENT: Negative.   Eyes: Negative.   Respiratory: Negative.   Cardiovascular: Negative.   Gastrointestinal: Negative.   Musculoskeletal: Negative.   Skin: Negative.   Neurological: Negative.   Psychiatric/Behavioral: Negative.   All other systems reviewed and are negative.    BP 114/76  Pulse 57  Ht 5\' 6"  (1.676 m)  Wt 196 lb 12 oz (89.245 kg)  BMI 31.77 kg/m2  Physical Exam  Nursing note and vitals reviewed. Constitutional: She is oriented to person, place, and time. She appears well-developed and well-nourished.  obese  HENT:  Head: Normocephalic.  Nose: Nose normal.  Mouth/Throat: Oropharynx is clear and moist.  Eyes: Conjunctivae are normal. Pupils are equal, round, and reactive to light.  Neck: Normal range of motion. Neck supple. No JVD present.  Cardiovascular: Normal rate, regular rhythm, S1 normal, S2 normal and intact distal pulses.  Exam reveals no gallop and no friction rub.   Murmur heard.  Crescendo systolic murmur is present with a grade of 1/6  Pulmonary/Chest: Effort normal and breath sounds normal. No respiratory distress. She has no wheezes. She has no rales. She exhibits no tenderness.  Abdominal: Soft. Bowel sounds are normal. She exhibits no distension. There is no tenderness.  Musculoskeletal: Normal range of motion. She exhibits no edema and no tenderness.  Lymphadenopathy:    She has no cervical adenopathy.  Neurological: She is alert and oriented to person, place, and time. Coordination normal.  Skin: Skin is warm and dry. No rash noted. No erythema.  Psychiatric: She has a normal  mood and affect. Her behavior is normal. Judgment and thought content normal.    Assessment and Plan

## 2012-10-30 NOTE — Assessment & Plan Note (Signed)
Cholesterol recently elevated. We will recheck again in 3 months time at her request

## 2012-10-30 NOTE — Patient Instructions (Addendum)
You are doing well. No medication changes were made.  We will check cholesterol again in 3 months We will check uric acid today  Please call us if you have new issues that need to be addressed before your next appt.  Your physician wants you to follow-up in: 12 months.  You will receive a reminder letter in the mail two months in advance. If you don't receive a letter, please call our office to schedule the follow-up appointment.

## 2012-10-30 NOTE — Assessment & Plan Note (Signed)
Rash seems to come and go, uncertain if related to one of her medications. No clear culprit

## 2012-10-30 NOTE — Assessment & Plan Note (Signed)
We have encouraged continued exercise, careful diet management in an effort to lose weight. 

## 2012-10-30 NOTE — Assessment & Plan Note (Signed)
Uncertain if she has gout. We will check a uric acid today.

## 2012-10-31 ENCOUNTER — Other Ambulatory Visit: Payer: Self-pay | Admitting: *Deleted

## 2012-10-31 LAB — URIC ACID: Uric Acid: 9.2 mg/dL — ABNORMAL HIGH (ref 2.5–7.1)

## 2012-10-31 MED ORDER — EZETIMIBE 10 MG PO TABS: 10.0000 mg | ORAL_TABLET | Freq: Every day | ORAL | Status: DC

## 2012-10-31 NOTE — Telephone Encounter (Signed)
Refilled Zetia sent to [facility] pharmacy.

## 2012-11-05 ENCOUNTER — Telehealth: Payer: Self-pay

## 2012-11-05 NOTE — Telephone Encounter (Signed)
Would probably just take colchicine  For gout flares until she gets set up Hold off on allopurinol for now until she sees PMD She could also see Dr. Gavin Potters, rheumatology at [facility]

## 2012-11-05 NOTE — Telephone Encounter (Signed)
Pt had uric acid level checked at [facility] on 10/30/12.  Levels elevated per Dr Mariah Milling, will likely need Allopurinol daily.  Pt does not have primary MD at present.  Advised pt to establish with primary MD ASAP.  Pt would like to know if Dr Mariah Milling will rx Allopurinol x 1 month while she works to establish with a primary care MD.  Please advise.

## 2012-11-06 MED ORDER — COLCHICINE 0.6 MG PO TABS: 0.6000 mg | ORAL_TABLET | Freq: Every day | ORAL | Status: DC

## 2012-11-06 NOTE — Telephone Encounter (Signed)
Called spoke with pt advised Dr Mariah Milling suggests pt take Colchicine daily during gout flare then stop for the interim until she can establish with a PMD or rheumatologist at [facility] clinic.  Pt verbalized understanding.  Will sent in rx to pharmacy for colchicine 0.6mg  QD prn #30 with no refills.

## 2013-01-15 ENCOUNTER — Other Ambulatory Visit: Payer: Self-pay | Admitting: Cardiovascular Disease

## 2013-01-16 ENCOUNTER — Other Ambulatory Visit: Payer: Self-pay | Admitting: *Deleted

## 2013-01-16 MED ORDER — FLUVASTATIN SODIUM 40 MG PO CAPS: ORAL_CAPSULE | ORAL | Status: DC

## 2013-01-16 NOTE — Telephone Encounter (Signed)
Requested Prescriptions   Signed Prescriptions Disp Refills  . fluvastatin (LESCOL) 40 MG capsule 30 capsule 3    Sig: TAKE ONE CAPSULE BY MOUTH AT BEDTIME    Authorizing Provider: Antonieta Iba    Ordering User: Kendrick Fries

## 2013-01-30 ENCOUNTER — Ambulatory Visit (INDEPENDENT_AMBULATORY_CARE_PROVIDER_SITE_OTHER): Payer: Medicare Other | Admitting: *Deleted

## 2013-01-30 DIAGNOSIS — E785 Hyperlipidemia, unspecified: Secondary | ICD-10-CM

## 2013-01-31 ENCOUNTER — Other Ambulatory Visit: Payer: Self-pay

## 2013-01-31 LAB — LIPID PANEL
Cholesterol, Total: 242 mg/dL — ABNORMAL HIGH (ref 100–199)
HDL: 53 mg/dL (ref >39)
Triglycerides: 283 mg/dL — ABNORMAL HIGH (ref 0–149)

## 2013-02-04 ENCOUNTER — Telehealth: Payer: Self-pay

## 2013-02-04 NOTE — Telephone Encounter (Signed)
Message copied by Marilynne Halsted on Mon Feb 04, 2013  5:05 PM ------      Message from: Antonieta Iba      Created: Sat Feb 02, 2013  9:44 PM       Cholesterol remains elevated at 240.      If taking the lescol and zetia, it does not appear to be working well.      May need to change lescol to alternative statin,      Such as crestor or atorvastatin ------

## 2013-02-04 NOTE — Telephone Encounter (Signed)
Would eat lots of oatmeal/fiber/tree bark until it comes out her ears Watch her weight, try to get down several pounds. Don't miss any doses of her pills Make sure she's not taking any vitamins or other pills interfering with the absorption of her Lescol and zetia

## 2013-02-04 NOTE — Telephone Encounter (Signed)
Left message for pt to call back  °

## 2013-02-04 NOTE — Telephone Encounter (Signed)
Spoke w/ pt.  She states that she is currently taking zetia and leschol and that she "really likes them", as she can tolerate them. Reports that she previously tried crestor and "that statin" and was unable to tolerate them. States that her cholesterol is "good compared to what is was" and "it's genetic" for her cholesterol to be elevated, esp LDLs. She is hesitant to try anything else.  Please advise.  Thank you.

## 2013-02-05 NOTE — Telephone Encounter (Signed)
Spoke w/ pt.  She states that she has to stop her fluvastatin when her "gout acts up", but this has not happened in "a while". She is agreeable to increasing her fiber and tree bark intake and will try to lose some wt.  Pt to call if she must miss more doses of her meds.

## 2013-03-05 ENCOUNTER — Other Ambulatory Visit: Payer: Self-pay | Admitting: Cardiovascular Disease

## 2013-03-06 ENCOUNTER — Other Ambulatory Visit: Payer: Self-pay | Admitting: *Deleted

## 2013-03-06 MED ORDER — EZETIMIBE 10 MG PO TABS: 10.0000 mg | ORAL_TABLET | Freq: Every day | ORAL | Status: DC

## 2013-03-06 NOTE — Telephone Encounter (Signed)
Requested Prescriptions   Signed Prescriptions Disp Refills   ezetimibe (ZETIA) 10 MG tablet 90 tablet 3    Sig: Take 1 tablet (10 mg total) by mouth daily.    Authorizing Provider: GOLLAN, TIMOTHY J    Ordering User: Aqueelah Cotrell C    

## 2013-03-12 ENCOUNTER — Other Ambulatory Visit: Payer: Self-pay | Admitting: Cardiovascular Disease

## 2013-03-13 ENCOUNTER — Other Ambulatory Visit: Payer: Self-pay | Admitting: *Deleted

## 2013-03-13 MED ORDER — VALSARTAN-HYDROCHLOROTHIAZIDE 160-25 MG PO TABS: 1.0000 | ORAL_TABLET | Freq: Every day | ORAL | Status: DC

## 2013-03-13 NOTE — Telephone Encounter (Signed)
Requested Prescriptions   Signed Prescriptions Disp Refills  . valsartan-hydrochlorothiazide (DIOVAN HCT) 160-25 MG per tablet 90 tablet 4    Sig: Take 1 tablet by mouth daily.    Authorizing Provider: Antonieta Iba    Ordering User: Kendrick Fries

## 2013-04-18 ENCOUNTER — Other Ambulatory Visit: Payer: Self-pay | Admitting: Cardiovascular Disease

## 2013-09-17 ENCOUNTER — Other Ambulatory Visit: Payer: Self-pay

## 2013-09-17 DIAGNOSIS — E785 Hyperlipidemia, unspecified: Secondary | ICD-10-CM

## 2013-09-17 DIAGNOSIS — Z79899 Other long term (current) drug therapy: Secondary | ICD-10-CM

## 2013-10-24 ENCOUNTER — Ambulatory Visit (INDEPENDENT_AMBULATORY_CARE_PROVIDER_SITE_OTHER): Payer: Medicare Other

## 2013-10-24 DIAGNOSIS — E785 Hyperlipidemia, unspecified: Secondary | ICD-10-CM | POA: Diagnosis not present

## 2013-10-24 DIAGNOSIS — Z79899 Other long term (current) drug therapy: Secondary | ICD-10-CM | POA: Diagnosis not present

## 2013-10-25 LAB — LIPID PANEL
CHOL/HDL RATIO: 5.2 ratio — AB (ref 0.0–4.4)
Cholesterol, Total: 235 mg/dL — ABNORMAL HIGH (ref 100–199)
HDL: 45 mg/dL (ref >39)
LDL CALC: 119 mg/dL — AB (ref 0–99)
TRIGLYCERIDES: 354 mg/dL — AB (ref 0–149)
VLDL CHOLESTEROL CAL: 71 mg/dL — AB (ref 5–40)

## 2013-10-25 LAB — HEPATIC FUNCTION PANEL
ALT: 13 IU/L (ref 0–32)
AST: 17 IU/L (ref 0–40)
Albumin: 4.3 g/dL (ref 3.6–4.8)
Alkaline Phosphatase: 63 IU/L (ref 39–117)
Bilirubin, Direct: 0.06 mg/dL (ref 0.00–0.40)
TOTAL PROTEIN: 6.9 g/dL (ref 6.0–8.5)
Total Bilirubin: 0.3 mg/dL (ref 0.0–1.2)

## 2013-10-30 ENCOUNTER — Encounter: Payer: Self-pay | Admitting: Cardiovascular Disease

## 2013-10-30 ENCOUNTER — Ambulatory Visit (INDEPENDENT_AMBULATORY_CARE_PROVIDER_SITE_OTHER): Payer: Medicare Other | Admitting: Cardiovascular Disease

## 2013-10-30 VITALS — BP 108/68 | HR 55 | Ht 66.0 in | Wt 199.0 lb

## 2013-10-30 DIAGNOSIS — R9431 Abnormal electrocardiogram [ECG] [EKG]: Secondary | ICD-10-CM

## 2013-10-30 DIAGNOSIS — E7800 Pure hypercholesterolemia, unspecified: Secondary | ICD-10-CM

## 2013-10-30 DIAGNOSIS — I10 Essential (primary) hypertension: Secondary | ICD-10-CM | POA: Diagnosis not present

## 2013-10-30 DIAGNOSIS — E669 Obesity, unspecified: Secondary | ICD-10-CM

## 2013-10-30 DIAGNOSIS — E7801 Familial hypercholesterolemia: Secondary | ICD-10-CM

## 2013-10-30 NOTE — Assessment & Plan Note (Signed)
We have encouraged continued exercise, careful diet management in an effort to lose weight. 

## 2013-10-30 NOTE — Assessment & Plan Note (Signed)
Diffuse T-wave abnormality concerning for underlying coronary artery disease. We'll continue to treat her aggressively. Currently asymptomatic with no shortness of breath or chest pain

## 2013-10-30 NOTE — Patient Instructions (Addendum)
You are doing well.  Decrease the aspirin down to 81 mg x 2 a day (down from 325 mg daily)  We will start praluent 75mg  sq every 2 weeks for cholesterol  Lab work with Cholesterol and LFTS in two months If the cholesterol continues to run high, We would increase the praluent up to 150 mg dose every 2 weeks  Please call us if you have new issues that need to be addressed before your next appt.  Your physician wants you to follow-up in: 3 months.  You will receive a reminder letter in the mail two months in advance. If you don't receive a letter, please call our office to schedule the follow-up appointment.

## 2013-10-30 NOTE — Assessment & Plan Note (Signed)
Blood pressure is well controlled on today's visit. No changes made to the medications. 

## 2013-10-30 NOTE — Progress Notes (Signed)
Patient ID: Dawn Herring, female    DOB: Mar 21, 1946, 68 y.o.   MRN: [id number]  HPI Comments: Dawn Herring is a very pleasant 68 year old woman with past medical history of hypertension, hyperlipidemia, with remote history of chest pain and tachycardia palpitations, previous stress test in 2005, also in 2008 or 2009 while at [facility], Who presents for routine followup   She reports that overall she is doing well.  She continues to take fluvastatin and zetia daily 100% compliance. No improvement in her cholesterol numbers. Total cholesterol continues to be 240 Strong family history of familial hypercholesterolemia.  She has tried numerous cholesterol medications in the past including Crestor, Lipitor, lovastatin. She did not tolerate these secondary to side effects. Unable to tolerate fluvastatin but this is very expensive as is zetia.   Some swelling of her ankles. This is trace No recent episodes of gout  She has been taking care of her father 59 years old, history of stroke. She sleeps with him 3-4 nights per week to take care of him She also reports having a periodic rash on her arms and legs and wonders if this could be from her medications  EKG shows sinus bradycardia with rate 55 beats per minute, diffuse T wave abnormality in the anterolateral V4 to V6, leads, one and aVL    Outpatient Encounter Prescriptions as of 10/30/2013  Medication Sig  . aspirin 325 MG tablet Take 325 mg by mouth daily.  . colchicine 0.6 MG tablet Take 1 tablet (0.6 mg total) by mouth daily. As needed for gout flare  . ezetimibe (ZETIA) 10 MG tablet Take 1 tablet (10 mg total) by mouth daily.  . fluvastatin (LESCOL) 40 MG capsule TAKE ONE CAPSULE BY MOUTH AT BEDTIME  . omeprazole (PRILOSEC) 20 MG capsule Take 20 mg by mouth daily.    . valsartan-hydrochlorothiazide (DIOVAN HCT) 160-25 MG per tablet Take 1 tablet by mouth daily.    Review of Systems  Constitutional: Negative.    HENT: Negative.   Eyes: Negative.   Respiratory: Negative.   Cardiovascular: Negative.   Gastrointestinal: Negative.   Endocrine: Negative.   Musculoskeletal: Negative.   Skin: Negative.   Allergic/Immunologic: Negative.   Neurological: Negative.   Hematological: Negative.   Psychiatric/Behavioral: Negative.   All other systems reviewed and are negative.   BP 108/68  Pulse 55  Ht 5\' 6"  (1.676 m)  Wt 199 lb (90.266 kg)  BMI 32.13 kg/m2  Physical Exam  Nursing note and vitals reviewed. Constitutional: She is oriented to person, place, and time. She appears well-developed and well-nourished.  obese  HENT:  Head: Normocephalic.  Nose: Nose normal.  Mouth/Throat: Oropharynx is clear and moist.  Eyes: Conjunctivae are normal. Pupils are equal, round, and reactive to light.  Neck: Normal range of motion. Neck supple. No JVD present.  Cardiovascular: Normal rate, regular rhythm, S1 normal, S2 normal and intact distal pulses.  Exam reveals no gallop and no friction rub.   Murmur heard.  Crescendo systolic murmur is present with a grade of 1/6  Pulmonary/Chest: Effort normal and breath sounds normal. No respiratory distress. She has no wheezes. She has no rales. She exhibits no tenderness.  Abdominal: Soft. Bowel sounds are normal. She exhibits no distension. There is no tenderness.  Musculoskeletal: Normal range of motion. She exhibits no edema and no tenderness.  Lymphadenopathy:    She has no cervical adenopathy.  Neurological: She is alert and oriented to person, place, and time.  Coordination normal.  Skin: Skin is warm and dry. No rash noted. No erythema.  Psychiatric: She has a normal mood and affect. Her behavior is normal. Judgment and thought content normal.    Assessment and Plan

## 2013-10-30 NOTE — Assessment & Plan Note (Addendum)
Cholesterol continues to run very high. Very high risk of peripheral arterial disease, coronary artery disease. Strong family history. In an effort to be more aggressive with her cholesterol, she is unable to tolerate other statins in medications, we will start praluent 75 mg subcutaneous twice a month. We will check cholesterol in 2 months time, titrating up to 150 mg if needed.  This medication was started in the office today. Training was given and she will give her own shot in 2 weeks' time

## 2013-11-26 ENCOUNTER — Telehealth: Payer: Self-pay

## 2013-11-26 NOTE — Telephone Encounter (Signed)
Pt called stating that she gave herself her Praluent injection 2 weeks ago as directed and is due to have her next scheduled dose tomorrow.  However, she reports that she has not received this in the mail yet.  Advised pt that we can give her a sample pen.  She is agreeable and will come by the office tomorrow (11/27/13) to pick up.

## 2013-11-27 DIAGNOSIS — L02619 Cutaneous abscess of unspecified foot: Secondary | ICD-10-CM | POA: Diagnosis not present

## 2013-11-27 DIAGNOSIS — M25579 Pain in unspecified ankle and joints of unspecified foot: Secondary | ICD-10-CM | POA: Diagnosis not present

## 2013-11-27 DIAGNOSIS — L03119 Cellulitis of unspecified part of limb: Secondary | ICD-10-CM | POA: Diagnosis not present

## 2013-11-28 ENCOUNTER — Other Ambulatory Visit: Payer: Self-pay

## 2013-11-28 MED ORDER — ALIROCUMAB 75 MG/ML ~~LOC~~ SOPN: 1.0000 "pen " | PEN_INJECTOR | SUBCUTANEOUS | Status: DC

## 2013-12-16 ENCOUNTER — Telehealth: Payer: Self-pay

## 2013-12-16 NOTE — Telephone Encounter (Signed)
Spoke w/ pt.  Advised her that we are still in the process of getting her Praluent approved, that it was denied and we will have to file an appeal.  She is appreciative and will call if she has any other questions or concerns.

## 2013-12-16 NOTE — Telephone Encounter (Signed)
Pt has a question regarding her Praulent

## 2013-12-24 DIAGNOSIS — H2510 Age-related nuclear cataract, unspecified eye: Secondary | ICD-10-CM | POA: Diagnosis not present

## 2013-12-24 DIAGNOSIS — H5210 Myopia, unspecified eye: Secondary | ICD-10-CM | POA: Diagnosis not present

## 2013-12-30 ENCOUNTER — Other Ambulatory Visit (INDEPENDENT_AMBULATORY_CARE_PROVIDER_SITE_OTHER): Payer: Medicare Other | Admitting: *Deleted

## 2013-12-30 DIAGNOSIS — E780 Pure hypercholesterolemia: Secondary | ICD-10-CM | POA: Diagnosis not present

## 2013-12-30 DIAGNOSIS — E669 Obesity, unspecified: Secondary | ICD-10-CM | POA: Diagnosis not present

## 2013-12-30 DIAGNOSIS — E7801 Familial hypercholesterolemia: Secondary | ICD-10-CM

## 2013-12-30 DIAGNOSIS — H6503 Acute serous otitis media, bilateral: Secondary | ICD-10-CM | POA: Diagnosis not present

## 2013-12-30 DIAGNOSIS — H6092 Unspecified otitis externa, left ear: Secondary | ICD-10-CM | POA: Diagnosis not present

## 2013-12-30 DIAGNOSIS — H918X2 Other specified hearing loss, left ear: Secondary | ICD-10-CM | POA: Diagnosis not present

## 2013-12-31 LAB — HEPATIC FUNCTION PANEL
ALK PHOS: 68 IU/L (ref 39–117)
ALT: 15 IU/L (ref 0–32)
AST: 23 IU/L (ref 0–40)
Albumin: 4.3 g/dL (ref 3.6–4.8)
Bilirubin, Direct: 0.11 mg/dL (ref 0.00–0.40)
Total Bilirubin: 0.4 mg/dL (ref 0.0–1.2)
Total Protein: 6.9 g/dL (ref 6.0–8.5)

## 2013-12-31 LAB — LIPID PANEL
CHOLESTEROL TOTAL: 161 mg/dL (ref 100–199)
Chol/HDL Ratio: 3.4 ratio units (ref 0.0–4.4)
HDL: 48 mg/dL (ref >39)
LDL CALC: 60 mg/dL (ref 0–99)
TRIGLYCERIDES: 265 mg/dL — AB (ref 0–149)
VLDL CHOLESTEROL CAL: 53 mg/dL — AB (ref 5–40)

## 2014-01-01 ENCOUNTER — Other Ambulatory Visit: Payer: Self-pay

## 2014-01-01 DIAGNOSIS — E785 Hyperlipidemia, unspecified: Secondary | ICD-10-CM

## 2014-01-21 ENCOUNTER — Telehealth: Payer: Self-pay | Admitting: Cardiovascular Disease

## 2014-01-21 NOTE — Telephone Encounter (Signed)
Pt is calling trying to see if she can pick up her shots, for PRALAUNT. Please call her, she is on her way.

## 2014-01-21 NOTE — Telephone Encounter (Signed)
Spoke w/ pt.  Advised her that we can give her samples of Praluent when she arrives.  She verbalizes understanding and is appreciative.

## 2014-01-30 ENCOUNTER — Ambulatory Visit (INDEPENDENT_AMBULATORY_CARE_PROVIDER_SITE_OTHER): Payer: Medicare Other | Admitting: Cardiovascular Disease

## 2014-01-30 ENCOUNTER — Encounter: Payer: Self-pay | Admitting: Cardiovascular Disease

## 2014-01-30 VITALS — BP 118/70 | HR 58 | Ht 66.0 in | Wt 196.5 lb

## 2014-01-30 DIAGNOSIS — E669 Obesity, unspecified: Secondary | ICD-10-CM

## 2014-01-30 DIAGNOSIS — E780 Pure hypercholesterolemia: Secondary | ICD-10-CM

## 2014-01-30 DIAGNOSIS — R21 Rash and other nonspecific skin eruption: Secondary | ICD-10-CM | POA: Diagnosis not present

## 2014-01-30 DIAGNOSIS — I159 Secondary hypertension, unspecified: Secondary | ICD-10-CM | POA: Diagnosis not present

## 2014-01-30 DIAGNOSIS — E7801 Familial hypercholesterolemia: Secondary | ICD-10-CM

## 2014-01-30 DIAGNOSIS — R9431 Abnormal electrocardiogram [ECG] [EKG]: Secondary | ICD-10-CM | POA: Diagnosis not present

## 2014-01-30 NOTE — Progress Notes (Signed)
Patient ID: Dawn Herring, female    DOB: 17-Feb-1946, 68 y.o.   MRN: [id number]  HPI Comments: Dawn Herring is a very pleasant 68 year old woman with past medical history of hypertension, familial hypercholesterolemia, with remote history of chest pain and tachycardia palpitations, previous stress test in 2005, also in 2008 or 2009 while at [facility], Who presents for routine followup   She continues to take fluvastatin and zetia daily 100% compliance. No previous improvement in her cholesterol numbers. Total cholesterol was 240 She was started on praluent with a dramatic improvement in her cholesterol. Most recent total cholesterol in the 160 range She has had approximately 7-8 treatments given every 2 weeks. Still on the 75 mg dose She does report that after the shot, she has very mild but uncomfortable rash on her shins. She puts hydrocortisone and Benadryl cream on her legswith improvement of her symptoms  EKG on today's visit shows normal sinus rhythm with rate 58 bpm, T-wave abnormality through the anterior precordial leads, 1 and aVL  Other past medical history Strong family history of familial hypercholesterolemia.  She has tried numerous cholesterol medications in the past including Crestor, Lipitor, lovastatin. She did not tolerate these secondary to side effects.   She has been taking care of her father 69 years old, history of stroke. She sleeps with him 3-4 nights per week to take care of him She also reports having a periodic rash on her arms and legs and wonders if this could be from her medications   Outpatient Encounter Prescriptions as of 10/30/2013  Medication Sig  . aspirin 325 MG tablet Take 325 mg by mouth daily.  . colchicine 0.6 MG tablet Take 1 tablet (0.6 mg total) by mouth daily. As needed for gout flare  . ezetimibe (ZETIA) 10 MG tablet Take 1 tablet (10 mg total) by mouth daily.  . fluvastatin (LESCOL) 40 MG capsule TAKE ONE CAPSULE BY  MOUTH AT BEDTIME  . omeprazole (PRILOSEC) 20 MG capsule Take 20 mg by mouth daily.    . valsartan-hydrochlorothiazide (DIOVAN HCT) 160-25 MG per tablet Take 1 tablet by mouth daily.   In terms of her social history  reports that she has never smoked. She does not have any smokeless tobacco history on file. She reports that she does not drink alcohol or use illicit drugs.   Review of Systems  Constitutional: Negative.   Respiratory: Negative.   Cardiovascular: Positive for leg swelling.  Gastrointestinal: Negative.   Musculoskeletal: Negative.   Skin: Positive for rash.  Neurological: Negative.   All other systems reviewed and are negative.   BP 118/70 mmHg  Pulse 58  Ht 5\' 6"  (1.676 m)  Wt 196 lb 8 oz (89.132 kg)  BMI 31.73 kg/m2  Physical Exam  Constitutional: She is oriented to person, place, and time. She appears well-developed and well-nourished.  HENT:  Head: Normocephalic.  Nose: Nose normal.  Mouth/Throat: Oropharynx is clear and moist.  Eyes: Conjunctivae are normal. Pupils are equal, round, and reactive to light.  Neck: Normal range of motion. Neck supple. No JVD present.  Cardiovascular: Normal rate, regular rhythm, S1 normal, S2 normal, normal heart sounds and intact distal pulses.  Exam reveals no gallop and no friction rub.   No murmur heard. Pulmonary/Chest: Effort normal and breath sounds normal. No respiratory distress. She has no wheezes. She has no rales. She exhibits no tenderness.  Abdominal: Soft. Bowel sounds are normal. She exhibits no distension. There is no  tenderness.  Musculoskeletal: Normal range of motion. She exhibits no edema or tenderness.  Lymphadenopathy:    She has no cervical adenopathy.  Neurological: She is alert and oriented to person, place, and time. Coordination normal.  Skin: Skin is warm and dry. No rash noted. No erythema.  Psychiatric: She has a normal mood and affect. Her behavior is normal. Judgment and thought content normal.     Assessment and Plan  Nursing note and vitals reviewed.

## 2014-01-30 NOTE — Assessment & Plan Note (Signed)
No symptoms concerning for angina. Diffuse T-wave abnormality on EKG No further workup at this time. Continue with aggressive cholesterol management

## 2014-01-30 NOTE — Patient Instructions (Addendum)
Your next appointment will be scheduled in our new office located at :  [place]  [address] [place], [place] [address]  You are doing well. No medication changes were made.  Please come in for labs in 2 to 3 months, fasting  Please call us if you have new issues that need to be addressed before your next appt.  Your physician wants you to follow-up in: 6 months.  You will receive a reminder letter in the mail two months in advance. If you don't receive a letter, please call our office to schedule the follow-up appointment.

## 2014-01-30 NOTE — Assessment & Plan Note (Signed)
Rash on her legs. She feels it is secondary to the praluent. It is tolerable and she is using cortisone cream with Benadryl cream

## 2014-01-30 NOTE — Assessment & Plan Note (Signed)
We have encouraged continued exercise, careful diet management in an effort to lose weight. 

## 2014-01-30 NOTE — Assessment & Plan Note (Signed)
Dramatic improvement in her cholesterol numbers, now on praluent. Most recent total cholesterol 160. Recommended that she continue on her statin and zetia for now if tolerated. Repeat cholesterol in 3 months time.

## 2014-01-30 NOTE — Assessment & Plan Note (Signed)
Blood pressure is well controlled on today's visit. No changes made to the medications. 

## 2014-02-11 ENCOUNTER — Telehealth: Payer: Self-pay | Admitting: Cardiovascular Disease

## 2014-02-11 NOTE — Telephone Encounter (Signed)
Rhonda from [employer] called to ask a few questions regarding an approval that we sent in. Pt was trying to get approved for paluent. Suanne Marker wanted to ask  1. What kind of cholesterolemia, is it and HOFA or HEFH 2. What other kind of cholesterol med would the pt take if they got approved for this med.   Please call back.

## 2014-02-19 NOTE — Telephone Encounter (Signed)
Reviewed what was available in the chart.  Pt's baseline LDL off all medications in 2012 was 224.  She has no cardiac history herself.  Family history states parents had heart disease but did not specify what kind or at what age they were diagnosed.  Physical exam does not mention any xanthomas or corneal arcus.  Based on this information she would have a [place] Criteria score of 3 at the most which would mean she unlikely has HoFE or HeFE.    Only past therapies I could find were lovastatin and Crestor both caused muscle and joint pain and Niacin caused rash.  She was on fluvastatin and Zetia prior to starting Praluent and LDL was 119.  Never saw were she tried Lipitor.   At this point, I would say pt does not qualify for Praluent.  LDL of 119 is acceptable for her with her family history and personal history.  Would continue fluvastatin and Zetia and discontinue Praluent.  I am very doubtful that it will ever be approved because she does not meet the labeled indications.

## 2014-02-25 NOTE — Telephone Encounter (Signed)
Per [employer], pt is approved for pt assistance w/ the cost of her Praluent injections.

## 2014-03-15 DIAGNOSIS — J019 Acute sinusitis, unspecified: Secondary | ICD-10-CM | POA: Diagnosis not present

## 2014-03-18 ENCOUNTER — Telehealth: Payer: Self-pay | Admitting: Cardiovascular Disease

## 2014-03-18 NOTE — Telephone Encounter (Signed)
Iantha Fallen has been taking care of paperwork for pt's Praluent. Pt has been approved for pt assistance.

## 2014-03-18 NOTE — Telephone Encounter (Signed)
Please see note below. 

## 2014-03-18 NOTE — Telephone Encounter (Signed)
[facility] called had questions about what we sent them, please call patient.

## 2014-03-27 ENCOUNTER — Telehealth: Payer: Self-pay

## 2014-03-27 NOTE — Telephone Encounter (Signed)
Authorized 1 year supply of Praluent through [employer].

## 2014-03-27 NOTE — Telephone Encounter (Signed)
Need to verify refills on the Praluent rx. Please call.

## 2014-03-31 ENCOUNTER — Telehealth: Payer: Self-pay

## 2014-03-31 NOTE — Telephone Encounter (Signed)
Pt called, states she needs to pick up Praluent injection tomorrow. Please call.

## 2014-03-31 NOTE — Telephone Encounter (Signed)
Spoke w/ pt.  Advised her that I can provide her w/ Praluent samples.  She will come by tomorrow at her convenience.

## 2014-04-03 ENCOUNTER — Other Ambulatory Visit: Payer: Self-pay | Admitting: Cardiovascular Disease

## 2014-04-10 ENCOUNTER — Telehealth: Payer: Self-pay | Admitting: Cardiovascular Disease

## 2014-04-10 NOTE — Telephone Encounter (Signed)
Prescription written, waiting on signature.

## 2014-04-10 NOTE — Telephone Encounter (Signed)
[facility] called asking if we could just fax over the Rx for Praluent. For there was some confusion with the pharmacy and this could help clear it up  Fax : [phone]

## 2014-04-10 NOTE — Telephone Encounter (Signed)
New Rx faxed to [phone].

## 2014-04-22 ENCOUNTER — Other Ambulatory Visit: Payer: Self-pay | Admitting: Cardiovascular Disease

## 2014-07-21 ENCOUNTER — Other Ambulatory Visit: Payer: Self-pay | Admitting: Cardiovascular Disease

## 2014-07-23 ENCOUNTER — Other Ambulatory Visit (INDEPENDENT_AMBULATORY_CARE_PROVIDER_SITE_OTHER): Payer: Medicare Other | Admitting: *Deleted

## 2014-07-23 DIAGNOSIS — E785 Hyperlipidemia, unspecified: Secondary | ICD-10-CM | POA: Diagnosis not present

## 2014-07-24 LAB — LIPID PANEL
Chol/HDL Ratio: 2.7 ratio units (ref 0.0–4.4)
Cholesterol, Total: 160 mg/dL (ref 100–199)
HDL: 59 mg/dL (ref >39)
LDL Calculated: 63 mg/dL (ref 0–99)
Triglycerides: 190 mg/dL — ABNORMAL HIGH (ref 0–149)
VLDL Cholesterol Cal: 38 mg/dL (ref 5–40)

## 2014-07-24 LAB — HEPATIC FUNCTION PANEL
ALT: 18 IU/L (ref 0–32)
AST: 30 IU/L (ref 0–40)
Albumin: 4.1 g/dL (ref 3.6–4.8)
Alkaline Phosphatase: 55 IU/L (ref 39–117)
BILIRUBIN TOTAL: 0.4 mg/dL (ref 0.0–1.2)
Bilirubin, Direct: 0.1 mg/dL (ref 0.00–0.40)
Total Protein: 6.6 g/dL (ref 6.0–8.5)

## 2014-07-30 ENCOUNTER — Encounter: Payer: Self-pay | Admitting: Cardiovascular Disease

## 2014-07-30 ENCOUNTER — Ambulatory Visit (INDEPENDENT_AMBULATORY_CARE_PROVIDER_SITE_OTHER): Payer: Medicare Other | Admitting: Cardiovascular Disease

## 2014-07-30 VITALS — BP 124/74 | HR 56 | Ht 66.0 in | Wt 198.2 lb

## 2014-07-30 DIAGNOSIS — R9431 Abnormal electrocardiogram [ECG] [EKG]: Secondary | ICD-10-CM | POA: Diagnosis not present

## 2014-07-30 DIAGNOSIS — I159 Secondary hypertension, unspecified: Secondary | ICD-10-CM | POA: Diagnosis not present

## 2014-07-30 DIAGNOSIS — E669 Obesity, unspecified: Secondary | ICD-10-CM | POA: Diagnosis not present

## 2014-07-30 DIAGNOSIS — E780 Pure hypercholesterolemia: Secondary | ICD-10-CM

## 2014-07-30 DIAGNOSIS — R21 Rash and other nonspecific skin eruption: Secondary | ICD-10-CM

## 2014-07-30 DIAGNOSIS — E7801 Familial hypercholesterolemia: Secondary | ICD-10-CM

## 2014-07-30 DIAGNOSIS — R000 Tachycardia, unspecified: Secondary | ICD-10-CM | POA: Insufficient documentation

## 2014-07-30 MED ORDER — EZETIMIBE 10 MG PO TABS: 10.0000 mg | ORAL_TABLET | Freq: Every day | ORAL | Status: DC

## 2014-07-30 MED ORDER — FLUVASTATIN SODIUM 40 MG PO CAPS: 40.0000 mg | ORAL_CAPSULE | Freq: Every day | ORAL | Status: DC

## 2014-07-30 NOTE — Assessment & Plan Note (Signed)
Rash seems to have resolved. No complaints on today's visit.

## 2014-07-30 NOTE — Assessment & Plan Note (Signed)
We have encouraged continued exercise, careful diet management in an effort to lose weight. 

## 2014-07-30 NOTE — Assessment & Plan Note (Signed)
Blood pressure is well controlled on today's visit. No changes made to the medications. 

## 2014-07-30 NOTE — Progress Notes (Signed)
Patient ID: Dawn Herring, female    DOB: 1946/03/26, 69 y.o.   MRN: [id number]  HPI Comments:  Dawn Herring is a very pleasant 69 year old woman with past medical history of hypertension, familial hypercholesterolemia, with remote history of chest pain and tachycardia palpitations (atrial fibrillation?), previous stress test in 2005, also in 2008 or 2009 while at [facility], Who presents for routine followup of her hyperlipidemia and hypertension  She continues to take fluvastatin and zetia daily 100% compliance.  Continues to take praluent 75 mg twice per month   Most recent total cholesterol in the 160 range She denies any side effects from the shot  Significant stress at home, taking care of 2 elderly family members in their 64s. Reports having one episode of back pain that presented overnight, feels that she was in atrial fibrillation that resolved by the morning.  EKG on today's visit shows normal sinus rhythm with rate 56 bpm, T-wave abnormality through the anterior precordial leads, 1 and aVL  Other past medical history Strong family history of familial hypercholesterolemia.  She has tried numerous cholesterol medications in the past including Crestor, Lipitor, lovastatin. She did not tolerate these secondary to side effects.   She has been taking care of her father 93 years old, history of stroke. She sleeps with him 3-4 nights per week to take care of him She also reports having a periodic rash on her arms and legs and wonders if this could be from her medications     Allergies  Allergen Reactions  . Statins     REACTION: Has terrible reactions; abdominal pain    Current Outpatient Prescriptions on File Prior to Visit  Medication Sig Dispense Refill  . Alirocumab (PRALUENT) 75 MG/ML SOPN Inject 1 pen into the skin as directed. Every 14 days 2 pen 6  . aspirin 81 MG tablet Take 162 mg by mouth daily.    . colchicine 0.6 MG tablet Take 1 tablet  (0.6 mg total) by mouth daily. As needed for gout flare 30 tablet 0  . omeprazole (PRILOSEC) 20 MG capsule Take 20 mg by mouth daily.      . valsartan-hydrochlorothiazide (DIOVAN-HCT) 160-25 MG per tablet TAKE 1 TABLET BY MOUTH EVERY DAY 90 tablet 3   No current facility-administered medications on file prior to visit.    Past Medical History  Diagnosis Date  . Hypertension   . Hyperlipidemia     Past Surgical History  Procedure Laterality Date  . Vaginal hysterectomy      abdominal hysterectomy  . Root canal  2013  . Root canal      Social History  reports that she has never smoked. She does not have any smokeless tobacco history on file. She reports that she does not drink alcohol or use illicit drugs.  Family History family history includes Heart disease in her father and mother.  Review of Systems  Constitutional: Negative.   HENT: Negative.   Respiratory: Negative.   Cardiovascular: Positive for palpitations.  Gastrointestinal: Negative.   Musculoskeletal: Positive for back pain.  Skin: Negative.   Neurological: Negative.   Hematological: Negative.   Psychiatric/Behavioral: Negative.   All other systems reviewed and are negative.   BP 124/74 mmHg  Pulse 56  Ht 5\' 6"  (1.676 m)  Wt 198 lb 4 oz (89.926 kg)  BMI 32.01 kg/m2  Physical Exam  Constitutional: She is oriented to person, place, and time. She appears well-developed and well-nourished.  HENT:  Head: Normocephalic.  Nose: Nose normal.  Mouth/Throat: Oropharynx is clear and moist.  Eyes: Conjunctivae are normal. Pupils are equal, round, and reactive to light.  Neck: Normal range of motion. Neck supple. No JVD present.  Cardiovascular: Normal rate, regular rhythm, S1 normal, S2 normal, normal heart sounds and intact distal pulses.  Exam reveals no gallop and no friction rub.   No murmur heard. Pulmonary/Chest: Effort normal and breath sounds normal. No respiratory distress. She has no wheezes. She has  no rales. She exhibits no tenderness.  Abdominal: Soft. Bowel sounds are normal. She exhibits no distension. There is no tenderness.  Musculoskeletal: Normal range of motion. She exhibits no edema or tenderness.  Lymphadenopathy:    She has no cervical adenopathy.  Neurological: She is alert and oriented to person, place, and time. Coordination normal.  Skin: Skin is warm and dry. No rash noted. No erythema.  Psychiatric: She has a normal mood and affect. Her behavior is normal. Judgment and thought content normal.    Assessment and Plan  Nursing note and vitals reviewed.

## 2014-07-30 NOTE — Assessment & Plan Note (Signed)
We have recommended that she stay on her current regimen. Most recent cholesterol 160

## 2014-07-30 NOTE — Assessment & Plan Note (Signed)
She reports having episode of severe back pain, atrial fibrillation, one episode that resolved by the morning Recommended if she has additional episodes that she call our office for a monitor

## 2014-07-30 NOTE — Patient Instructions (Signed)
You are doing well. No medication changes were made.  Please call us if you have new issues that need to be addressed before your next appt.  Your physician wants you to follow-up in: 6 months.  You will receive a reminder letter in the mail two months in advance. If you don't receive a letter, please call our office to schedule the follow-up appointment.   

## 2014-08-01 ENCOUNTER — Telehealth: Payer: Self-pay | Admitting: *Deleted

## 2014-08-01 NOTE — Telephone Encounter (Signed)
[employer] needs prior auth on Praluent

## 2014-08-04 NOTE — Telephone Encounter (Signed)
Notified patient we will call for prior authorization on the Praluent.

## 2014-08-05 ENCOUNTER — Other Ambulatory Visit: Payer: Self-pay

## 2014-08-11 ENCOUNTER — Other Ambulatory Visit: Payer: Self-pay | Admitting: Physician Assistant

## 2014-08-14 ENCOUNTER — Telehealth: Payer: Self-pay | Admitting: Cardiovascular Disease

## 2014-08-14 NOTE — Telephone Encounter (Signed)
Spoke with the patient told her that we did get the denial letter as well and there is an appeal letter in process that we have faxed to see if can get praluent approved.

## 2014-08-14 NOTE — Telephone Encounter (Signed)
Patient received denial letter from [place] for [place].  Patient will fax letter to office.  Please advise patient of what to do next.

## 2014-08-22 ENCOUNTER — Telehealth: Payer: Self-pay

## 2014-08-22 DIAGNOSIS — Z8249 Family history of ischemic heart disease and other diseases of the circulatory system: Secondary | ICD-10-CM

## 2014-08-22 DIAGNOSIS — E785 Hyperlipidemia, unspecified: Secondary | ICD-10-CM

## 2014-08-22 NOTE — Telephone Encounter (Signed)
Spoke with the patient regarding the approval of praluent which will still cost her $565.00 for two injections a month. Patient wants to know if she really needs this medication because she can't afford this but will try for a little bit if you really feel that she has no other choice.

## 2014-08-22 NOTE — Telephone Encounter (Signed)
LMOM the Praluant has been approved from 08/08/2014 through 08/08/2015.

## 2014-08-26 NOTE — Telephone Encounter (Signed)
Would have her strongly consider a CT coronary calcium score  If there score is very low, could stop the praluent  If the score is elevated, may need to continue (perhaps we could help with samples) Cost of the scan is $150 and will document her calcified coronary plaque that she has so far

## 2014-08-27 NOTE — Telephone Encounter (Signed)
Spoke w/ pt.  Advised her of Dr. Donivan Scull recommendation.  She verbalizes understanding and is agreeable, though she does have a lot going on personally.  Pt sched for CT cardiac scoring Friday, June 3 @ 2:00 in the [facility] office.

## 2014-08-29 ENCOUNTER — Ambulatory Visit (INDEPENDENT_AMBULATORY_CARE_PROVIDER_SITE_OTHER)
Admission: RE | Admit: 2014-08-29 | Discharge: 2014-08-29 | Disposition: A | Discharge status: Home / Self Care | Payer: Medicare Other | Source: Ambulatory Visit | Attending: Cardiovascular Disease | Admitting: Cardiovascular Disease

## 2014-08-29 ENCOUNTER — Inpatient Hospital Stay: Admission: RE | Admit: 2014-08-29 | Payer: Medicare Other | Source: Ambulatory Visit

## 2014-08-29 DIAGNOSIS — E785 Hyperlipidemia, unspecified: Secondary | ICD-10-CM

## 2014-08-29 DIAGNOSIS — Z8249 Family history of ischemic heart disease and other diseases of the circulatory system: Secondary | ICD-10-CM

## 2014-09-01 ENCOUNTER — Encounter: Payer: Self-pay | Admitting: Cardiovascular Disease

## 2014-09-02 ENCOUNTER — Other Ambulatory Visit: Payer: Self-pay

## 2014-09-02 DIAGNOSIS — R222 Localized swelling, mass and lump, trunk: Secondary | ICD-10-CM

## 2014-09-22 ENCOUNTER — Other Ambulatory Visit: Payer: Self-pay

## 2014-11-25 ENCOUNTER — Telehealth: Payer: Self-pay | Admitting: *Deleted

## 2014-11-25 NOTE — Telephone Encounter (Signed)
Pt calling stating she is having a reaction to Praluent.  She thinks is thinking it may be the combination of medication that we have given her Started bothering her Aug 14  Pt stopped taking it for a week and then started back on Pt is stating she having swelling really bad in her feet.  Within 2 hours of taking the shot, she got pains in her legs, and it got bad that she couldn't walk. But then thinks its a combo with Simvastatin and Zetia  Left foot, feels likes a fraction.   Pt also would like some blood work. States she always gets it done a week before she comes to see [place].  Please advise.

## 2014-11-26 NOTE — Telephone Encounter (Signed)
She could hold the simvastatin, that can cause muscle issues zetia should be okay Typically we have not seen any problems on the praluent, but if she feels she needs a break, that can be done  Would let us know if swelling persists, could be from something else

## 2014-11-27 NOTE — Telephone Encounter (Signed)
Left detailed message on pt's vm w/ Dr. Donivan Scull recommendation.  Asked her to call back in a week or 2 to let me know how she is doing if she decides to hold the simvastatin, otherwise call back w/ any questions.

## 2014-12-03 ENCOUNTER — Ambulatory Visit
Admission: RE | Admit: 2014-12-03 | Discharge: 2014-12-03 | Disposition: A | Discharge status: Home / Self Care | Payer: Medicare Other | Source: Ambulatory Visit | Attending: Nurse Practitioner | Admitting: Nurse Practitioner

## 2014-12-03 DIAGNOSIS — R222 Localized swelling, mass and lump, trunk: Secondary | ICD-10-CM | POA: Diagnosis not present

## 2014-12-03 DIAGNOSIS — K449 Diaphragmatic hernia without obstruction or gangrene: Secondary | ICD-10-CM | POA: Insufficient documentation

## 2014-12-03 DIAGNOSIS — E041 Nontoxic single thyroid nodule: Secondary | ICD-10-CM | POA: Diagnosis not present

## 2014-12-03 DIAGNOSIS — R911 Solitary pulmonary nodule: Secondary | ICD-10-CM | POA: Diagnosis not present

## 2014-12-03 DIAGNOSIS — I2510 Atherosclerotic heart disease of native coronary artery without angina pectoris: Secondary | ICD-10-CM | POA: Insufficient documentation

## 2014-12-03 DIAGNOSIS — I517 Cardiomegaly: Secondary | ICD-10-CM | POA: Diagnosis not present

## 2014-12-26 DIAGNOSIS — H2513 Age-related nuclear cataract, bilateral: Secondary | ICD-10-CM | POA: Diagnosis not present

## 2014-12-26 DIAGNOSIS — H5213 Myopia, bilateral: Secondary | ICD-10-CM | POA: Diagnosis not present

## 2015-01-20 ENCOUNTER — Telehealth: Payer: Self-pay | Admitting: *Deleted

## 2015-01-20 DIAGNOSIS — E785 Hyperlipidemia, unspecified: Secondary | ICD-10-CM

## 2015-01-20 NOTE — Telephone Encounter (Signed)
Pt is asking if she can come tomorrow to do some blood work.  She says she does it a week before her apt with [place] every time.  Please advise.

## 2015-01-20 NOTE — Telephone Encounter (Signed)
Absolutely.  I'll put the orders in.   Thank you!

## 2015-01-21 ENCOUNTER — Other Ambulatory Visit (INDEPENDENT_AMBULATORY_CARE_PROVIDER_SITE_OTHER): Payer: Medicare Other | Admitting: *Deleted

## 2015-01-21 DIAGNOSIS — E785 Hyperlipidemia, unspecified: Secondary | ICD-10-CM

## 2015-01-22 LAB — LIPID PANEL
CHOL/HDL RATIO: 3.5 ratio (ref 0.0–4.4)
CHOLESTEROL TOTAL: 181 mg/dL (ref 100–199)
HDL: 51 mg/dL (ref >39)
LDL CALC: 70 mg/dL (ref 0–99)
Triglycerides: 302 mg/dL — ABNORMAL HIGH (ref 0–149)
VLDL CHOLESTEROL CAL: 60 mg/dL — AB (ref 5–40)

## 2015-01-22 LAB — HEPATIC FUNCTION PANEL
ALBUMIN: 4.5 g/dL (ref 3.6–4.8)
ALT: 15 IU/L (ref 0–32)
AST: 25 IU/L (ref 0–40)
Alkaline Phosphatase: 67 IU/L (ref 39–117)
Bilirubin Total: 0.5 mg/dL (ref 0.0–1.2)
Bilirubin, Direct: 0.12 mg/dL (ref 0.00–0.40)
TOTAL PROTEIN: 7.5 g/dL (ref 6.0–8.5)

## 2015-01-26 ENCOUNTER — Encounter: Payer: Self-pay | Admitting: Cardiovascular Disease

## 2015-01-26 ENCOUNTER — Ambulatory Visit (INDEPENDENT_AMBULATORY_CARE_PROVIDER_SITE_OTHER): Payer: Medicare Other | Admitting: Cardiovascular Disease

## 2015-01-26 VITALS — BP 118/62 | HR 74 | Ht 66.0 in | Wt 193.5 lb

## 2015-01-26 DIAGNOSIS — M7989 Other specified soft tissue disorders: Secondary | ICD-10-CM

## 2015-01-26 DIAGNOSIS — E7801 Familial hypercholesterolemia: Secondary | ICD-10-CM | POA: Diagnosis not present

## 2015-01-26 DIAGNOSIS — I2510 Atherosclerotic heart disease of native coronary artery without angina pectoris: Secondary | ICD-10-CM

## 2015-01-26 DIAGNOSIS — I159 Secondary hypertension, unspecified: Secondary | ICD-10-CM

## 2015-01-26 DIAGNOSIS — E041 Nontoxic single thyroid nodule: Secondary | ICD-10-CM

## 2015-01-26 DIAGNOSIS — R000 Tachycardia, unspecified: Secondary | ICD-10-CM | POA: Diagnosis not present

## 2015-01-26 DIAGNOSIS — E669 Obesity, unspecified: Secondary | ICD-10-CM

## 2015-01-26 DIAGNOSIS — I4891 Unspecified atrial fibrillation: Secondary | ICD-10-CM

## 2015-01-26 NOTE — Progress Notes (Signed)
Patient ID: Dawn Herring, female    DOB: [birth year], 69 y.o.   MRN: [id number]  HPI Comments:  Dawn Herring is a very pleasant 69 year old woman with past medical history of hypertension, familial hypercholesterolemia, with remote history of chest pain and tachycardia palpitations (atrial fibrillation?), previous stress test in 2005, also in 2008 or 2009 while at [facility], Who presents for routine followup of her hyperlipidemia hypertension, and arrhythmia Prior CT coronary calcium score with CAD noted, started on more aggressive cholesterol regiment at that time  She reports that she had significant myalgias on the fluvastatin and she has held the medication with improvement of her symptoms She reports having a gallbladder attack several weeks ago and in the setting of severe pain, had tachycardia concerning for arrhythmia She does report having other periods of time concerning for arrhythmia, short in duration, sometimes in bed when she lays funny Otherwise reports feeling well. Significant stress at home taking care of her father. Has to have help. Reports having some swelling in her legs, sometimes painful. Does not wear compressions on a regular basis  Lab work reviewed with her showing increase in her cholesterol without the fluvastatin up to 180 from 160 Tolerating Praluent  EKG on today's visit shows normal sinus rhythm with rate 74 bpm, diffuse T-wave abnormality to the anterior precordial leads, one and aVL, no change from prior EKG  Other past medical history Significant stress at home, taking care of 2 elderly family members in their 22s. Prior episode of tachycardia concerning for arrhythmia in the setting of back pain in the past  Strong family history of familial hypercholesterolemia.  She has tried numerous cholesterol medications in the past including Crestor, Lipitor, lovastatin. She did not tolerate these secondary to side effects.   She has  been taking care of her father 70 years old, history of stroke. She sleeps with him 3-4 nights per week to take care of him She also reports having a periodic rash on her arms and legs and wonders if this could be from her medications     Allergies  Allergen Reactions  . Statins     REACTION: Has terrible reactions; abdominal pain    Current Outpatient Prescriptions on File Prior to Visit  Medication Sig Dispense Refill  . Alirocumab (PRALUENT) 75 MG/ML SOPN Inject 1 pen into the skin as directed. Every 14 days 2 pen 6  . aspirin 81 MG tablet Take 162 mg by mouth daily.    . colchicine 0.6 MG tablet Take 1 tablet (0.6 mg total) by mouth daily. As needed for gout flare 30 tablet 0  . ezetimibe (ZETIA) 10 MG tablet Take 1 tablet (10 mg total) by mouth daily. 90 tablet 3  . omeprazole (PRILOSEC) 20 MG capsule Take 20 mg by mouth daily.      . valsartan-hydrochlorothiazide (DIOVAN-HCT) 160-25 MG per tablet TAKE 1 TABLET BY MOUTH EVERY DAY 90 tablet 3   No current facility-administered medications on file prior to visit.    Past Medical History  Diagnosis Date  . Hypertension   . Hyperlipidemia     Past Surgical History  Procedure Laterality Date  . Vaginal hysterectomy      abdominal hysterectomy  . Root canal  2013  . Root canal      Social History  reports that she has never smoked. She does not have any smokeless tobacco history on file. She reports that she does not drink alcohol or  use illicit drugs.  Family History family history includes Heart disease in her father and mother.  Review of Systems  Constitutional: Negative.   Respiratory: Negative.   Cardiovascular: Positive for palpitations and leg swelling.  Gastrointestinal: Negative.   Musculoskeletal: Negative.   Skin: Negative.   Neurological: Negative.   Hematological: Negative.   Psychiatric/Behavioral: Negative.   All other systems reviewed and are negative.   BP 118/62 mmHg  Pulse 74  Ht 5\' 6"   (1.676 m)  Wt 193 lb 8 oz (87.771 kg)  BMI 31.25 kg/m2  Physical Exam  Constitutional: She is oriented to person, place, and time. She appears well-developed and well-nourished.  HENT:  Head: Normocephalic.  Nose: Nose normal.  Mouth/Throat: Oropharynx is clear and moist.  Eyes: Conjunctivae are normal. Pupils are equal, round, and reactive to light.  Neck: Normal range of motion. Neck supple. No JVD present.  Cardiovascular: Normal rate, regular rhythm, S1 normal, S2 normal, normal heart sounds and intact distal pulses.  Exam reveals no gallop and no friction rub.   No murmur heard. Pulmonary/Chest: Effort normal and breath sounds normal. No respiratory distress. She has no wheezes. She has no rales. She exhibits no tenderness.  Abdominal: Soft. Bowel sounds are normal. She exhibits no distension. There is no tenderness.  Musculoskeletal: Normal range of motion. She exhibits no edema or tenderness.  Lymphadenopathy:    She has no cervical adenopathy.  Neurological: She is alert and oriented to person, place, and time. Coordination normal.  Skin: Skin is warm and dry. No rash noted. No erythema.  Psychiatric: She has a normal mood and affect. Her behavior is normal. Judgment and thought content normal.    Assessment and Plan  Nursing note and vitals reviewed.

## 2015-01-26 NOTE — Patient Instructions (Addendum)
You are doing well.  We will set up an appt with primary care for thyroid workup You are schedule to see Dr. Tommi Rumps on Wed, Nov 2 @ 9:00 Please arrive @ 8:45 & bring your ins card and driver's license  Please wear compression hose for leg swelling  Avoid breads and potatoes  We will order an event monitor atrial fibrillation  Please call us if you have new issues that need to be addressed before your next appt.  Your physician wants you to follow-up in: 3 months  Cardiac Event Monitoring A cardiac event monitor is a small recording device used to help detect abnormal heart rhythms (arrhythmias). The monitor is used to record heart rhythm when noticeable symptoms such as the following occur:  Fast heartbeats (palpitations), such as heart racing or fluttering.  Dizziness.  Fainting or light-headedness.  Unexplained weakness. The monitor is wired to two electrodes placed on your chest. Electrodes are flat, sticky disks that attach to your skin. The monitor can be worn for up to 30 days. You will wear the monitor at all times, except when bathing.  HOW TO USE YOUR CARDIAC EVENT MONITOR A technician will prepare your chest for the electrode placement. The technician will show you how to place the electrodes, how to work the monitor, and how to replace the batteries. Take time to practice using the monitor before you leave the office. Make sure you understand how to send the information from the monitor to your health care provider. This requires a telephone with a landline, not a cell phone. You need to:  Wear your monitor at all times, except when you are in water:  Do not get the monitor wet.  Take the monitor off when bathing. Do not swim or use a hot tub with it on.  Keep your skin clean. Do not put body lotion or moisturizer on your chest.  Change the electrodes daily or any time they stop sticking to your skin. You might need to use tape to keep them on.  It is  possible that your skin under the electrodes could become irritated. To keep this from happening, try to put the electrodes in slightly different places on your chest. However, they must remain in the area under your left breast and in the upper right section of your chest.  Make sure the monitor is safely clipped to your clothing or in a location close to your body that your health care provider recommends.  Press the button to record when you feel symptoms of heart trouble, such as dizziness, weakness, light-headedness, palpitations, thumping, shortness of breath, unexplained weakness, or a fluttering or racing heart. The monitor is always on and records what happened slightly before you pressed the button, so do not worry about being too late to get good information.  Keep a diary of your activities, such as walking, doing chores, and taking medicine. It is especially important to note what you were doing when you pushed the button to record your symptoms. This will help your health care provider determine what might be contributing to your symptoms. The information stored in your monitor will be reviewed by your health care provider alongside your diary entries.  Send the recorded information as recommended by your health care provider. It is important to understand that it will take some time for your health care provider to process the results.  Change the batteries as recommended by your health care provider. SEEK IMMEDIATE MEDICAL CARE IF:  You have chest pain.  You have extreme difficulty breathing or shortness of breath.  You develop a very fast heartbeat that persists.  You develop dizziness that does not go away.  You faint or constantly feel you are about to faint.   This information is not intended to replace advice given to you by your health care provider. Make sure you discuss any questions you have with your health care provider.   Document Released: 12/22/2007 Document  Revised: 04/04/2014 Document Reviewed: 09/10/2012 Elsevier Interactive Patient Education 2016 [employer] Thyroid Nodule A thyroid nodule is an isolatedgrowth of thyroid cells that forms a lump in your thyroid gland. The thyroid gland is a butterfly-shaped gland. It is found in the lower front of your neck. This gland sends chemical messengers (hormones) through your blood to all parts of your body. These hormones are important in regulating your body temperature and helping your body to use energy. Thyroid nodules are common. Most are not cancerous (are benign). You may have one nodule or several nodules.  Different types of thyroid nodules include:  Nodules that grow and fill with fluid (thyroid cysts).  Nodules that produce too much thyroid hormone (hot nodules or hyperthyroid).  Nodules that produce no thyroid hormone (cold nodules or hypothyroid).  Nodules that form from cancer cells (thyroid cancers). CAUSES Usually, the cause of this condition is not known. RISK FACTORS Factors that make this condition more likely to develop include:  Increasing age. Thyroid nodules become more common in people who are older than 70 years of age.  Gender.  Benign thyroid nodules are more common in women.  Cancerous (malignant) thyroid nodules are more common in men.  A family history that includes:  Thyroid nodules.  Pheochromocytoma.  Thyroid carcinoma.  Hyperparathyroidism.  Certain kinds of thyroid diseases, such as Hashimoto thyroiditis.  Lack of iodine.  A history of head and neck radiation, such as from X-rays. SYMPTOMS It is common for this condition to cause no symptoms. If you have symptoms, they may include:  A lump in your lower neck.  Feeling a lump or tickle in your throat.  Pain in your neck, jaw, or ear.  Having trouble swallowing. Hot nodules may cause symptoms that include:  Weight loss.  Warm, flushed skin.  Feeling hot.  Feeling nervous.  A  racing heartbeat. Cold nodules may cause symptoms that include:  Weight gain.  Dry skin.  Brittle hair. This may also occur with hair loss.  Feeling cold.  Fatigue. Thyroid cancer nodules may cause symptoms that include:  Hard nodules that feel stuck to the thyroid gland.  Hoarseness.  Lumps in the glands near your thyroid (lymph nodes). DIAGNOSIS A thyroid nodule may be felt by your health care provider during a physical exam. This condition may also be diagnosed based on your symptoms. You may also have tests, including:  An ultrasound. This may be done to confirm the diagnosis.  A biopsy. This involves taking a sample from the nodule and looking at it under a microscope to see if the nodule is benign.  Blood tests to make sure that your thyroid is working properly.  Imaging tests such as MRI or CT scan may be done if:  Your nodule is large.  Your nodule is blocking your airway.  Cancer is suspected. TREATMENT Treatment depends on the cause and size of your nodule or nodules. If the nodule is benign, treatment may not be necessary. Your health care provider may monitor the nodule to  see if it goes away without treatment. If the nodule continues to grow, is cancerous, or does not go away:  It may need to be drained with a needle.  It may need to be removed with surgery. If you have surgery, part or all of your thyroid gland may need to be removed as well. HOME CARE INSTRUCTIONS  Pay attention to any changes in your nodule.  Take over-the-counter and prescription medicines only as told by your health care provider.  Keep all follow-up visits as told by your health care provider. This is important. SEEK MEDICAL CARE IF:  Your voice changes.  You have trouble swallowing.  You have pain in your neck, ear, or jaw that is getting worse.  Your nodule gets bigger.  Your nodule starts to make it harder for you to breathe. SEEK IMMEDIATE MEDICAL CARE IF:  You  have a sudden fever.  You feel very weak.  Your muscles look like they are shrinking (muscle wasting).  You have mood swings.  You feel very restless.  You feel confused.  You are seeing or hearing things that other people do not see or hear (having hallucinations).  You feel suddenly nauseous or throw up.  You suddenly have diarrhea.  You have chest pain.  There is a loss of consciousness.   This information is not intended to replace advice given to you by your health care provider. Make sure you discuss any questions you have with your health care provider.   Document Released: 02/05/2004 Document Revised: 12/03/2014 Document Reviewed: 06/25/2014 Elsevier Interactive Patient Education [employer].

## 2015-01-26 NOTE — Assessment & Plan Note (Signed)
Long history of lower extremity swelling. She does take HCTZ I suspect she has chronic venous insufficiency, exacerbated by weight. Would recommend compression hose, leg elevation

## 2015-01-26 NOTE — Assessment & Plan Note (Signed)
Cholesterol 180, unable to tolerate the fluvastatin secondary to myalgias We will continue zetia and praluent

## 2015-01-26 NOTE — Assessment & Plan Note (Signed)
Recommended a low bread, low potato/carbohydrate diet Dietary guide provided to her today

## 2015-01-26 NOTE — Assessment & Plan Note (Signed)
Blood pressure is well controlled on today's visit. No changes made to the medications. 

## 2015-01-26 NOTE — Assessment & Plan Note (Signed)
She reports a family history of thyroid disease. We have offered a thyroid ultrasound. Perhaps we will wait to see if primary care can arrange this with the appropriate follow-up if this comes back positive for nodule

## 2015-01-26 NOTE — Assessment & Plan Note (Signed)
Coronary calcifications seen on CT coronary calcium score We'll continue aggressive cholesterol management

## 2015-01-26 NOTE — Assessment & Plan Note (Signed)
Recommended she wear a 30 day monitor to rule out arrhythmia Recent tachycardia events/episodes in the setting of back pain or "gallbladder pain"

## 2015-01-28 ENCOUNTER — Ambulatory Visit (INDEPENDENT_AMBULATORY_CARE_PROVIDER_SITE_OTHER): Payer: Medicare Other | Admitting: Family Medicine

## 2015-01-28 ENCOUNTER — Encounter: Payer: Self-pay | Admitting: Family Medicine

## 2015-01-28 ENCOUNTER — Encounter (INDEPENDENT_AMBULATORY_CARE_PROVIDER_SITE_OTHER): Payer: Medicare Other

## 2015-01-28 VITALS — BP 116/66 | HR 59 | Temp 97.6°F | Ht 66.0 in | Wt 193.0 lb

## 2015-01-28 DIAGNOSIS — M7989 Other specified soft tissue disorders: Secondary | ICD-10-CM

## 2015-01-28 DIAGNOSIS — M25473 Effusion, unspecified ankle: Secondary | ICD-10-CM

## 2015-01-28 DIAGNOSIS — E041 Nontoxic single thyroid nodule: Secondary | ICD-10-CM

## 2015-01-28 DIAGNOSIS — I2510 Atherosclerotic heart disease of native coronary artery without angina pectoris: Secondary | ICD-10-CM

## 2015-01-28 DIAGNOSIS — I159 Secondary hypertension, unspecified: Secondary | ICD-10-CM | POA: Diagnosis not present

## 2015-01-28 DIAGNOSIS — I4891 Unspecified atrial fibrillation: Secondary | ICD-10-CM | POA: Diagnosis not present

## 2015-01-28 DIAGNOSIS — K8050 Calculus of bile duct without cholangitis or cholecystitis without obstruction: Secondary | ICD-10-CM | POA: Diagnosis not present

## 2015-01-28 DIAGNOSIS — K829 Disease of gallbladder, unspecified: Secondary | ICD-10-CM

## 2015-01-28 LAB — CBC
HCT: 39.6 % (ref 36.0–46.0)
Hemoglobin: 13.4 g/dL (ref 12.0–15.0)
MCHC: 34 g/dL (ref 30.0–36.0)
MCV: 95.3 fl (ref 78.0–100.0)
Platelets: 263 10*3/uL (ref 150.0–400.0)
RBC: 4.15 Mil/uL (ref 3.87–5.11)
RDW: 13.3 % (ref 11.5–15.5)
WBC: 6.3 10*3/uL (ref 4.0–10.5)

## 2015-01-28 LAB — COMPREHENSIVE METABOLIC PANEL
ALK PHOS: 58 U/L (ref 39–117)
ALT: 14 U/L (ref 0–35)
AST: 19 U/L (ref 0–37)
Albumin: 4 g/dL (ref 3.5–5.2)
BILIRUBIN TOTAL: 0.4 mg/dL (ref 0.2–1.2)
BUN: 14 mg/dL (ref 6–23)
CO2: 29 meq/L (ref 19–32)
CREATININE: 0.92 mg/dL (ref 0.40–1.20)
Calcium: 9.8 mg/dL (ref 8.4–10.5)
Chloride: 100 mEq/L (ref 96–112)
GFR: 64.17 mL/min (ref >60.00)
GLUCOSE: 101 mg/dL — AB (ref 70–99)
Potassium: 3.7 mEq/L (ref 3.5–5.1)
Sodium: 141 mEq/L (ref 135–145)
Total Protein: 7.3 g/dL (ref 6.0–8.3)

## 2015-01-28 LAB — T4, FREE: FREE T4: 0.92 ng/dL (ref 0.60–1.60)

## 2015-01-28 LAB — T3, FREE: T3 FREE: 3.7 pg/mL (ref 2.3–4.2)

## 2015-01-28 LAB — TSH: TSH: 1.14 u[IU]/mL (ref 0.35–4.50)

## 2015-01-28 NOTE — Assessment & Plan Note (Signed)
Patient reports a long history of what she describes as gallbladder attacks. She has no abdominal pain with this, though does have some shoulder discomfort that could be related to her gallbladder given its association with eating fatty foods. Her abdominal exam and back exam are benign today. We will Check an ultrasound of her abdomen to evaluate her gallbladder and for other structural issues. We'll also check liver function tests. She is given return precautions.

## 2015-01-28 NOTE — Progress Notes (Signed)
Pre visit review using our clinic review tool, if applicable. No additional management support is needed unless otherwise documented below in the visit note. 

## 2015-01-28 NOTE — Patient Instructions (Signed)
Nice to meet you. We will obtain lab work and ultrasounds of your thyroid and abdomen.  If you develop abdominal pain, chest pain, shortness of breath, fever, nausea, vomiting, diarrhea, or any new symptoms please seek medical attention.

## 2015-01-28 NOTE — Assessment & Plan Note (Signed)
Noted on recent CT scan. She does have a history of malignant thyroid nodules in her family. We will check thyroid function tests and order an ultrasound of her thyroid. If the ultrasound does reveal a nodule we will refer her to ENT.

## 2015-01-28 NOTE — Progress Notes (Signed)
Patient ID: Dawn Herring, female   DOB: 03-20-1946, 69 y.o.   MRN: [id number]  Dawn Rumps, MD Phone: [phone]  Dawn Herring is a 69 y.o. female who presents today for new patient visit.  Thyroid nodule: Patient notes this is seen on recent CT scan. She's not ever noticed a nodule. She's had prior normal thyroid function testing. She does note her brother was found to have a thyroid nodule recently and was found to be malignant. No issue swallowing.  Gallbladder attacks: Patient notes long history of intermittent "gallbladder attack" since her 30s. She notes she had an ultrasound at that time that showed a thickened posterior wall, though she saw a surgeon and they advised her not to have surgery until she developed gallstones. She notes periodically this will flareup. Typically occurs 2 times per year. Last flareup was 2 weeks ago. She describes the flareups as bilateral scapular discomfort that she states is a horrible pain. She does note she is sore on her left side following this. Denies abdominal pain or right sided discomfort with this. Notes this only occurs after eating fatty foods. This past time she had eaten bacon prior to the attack. She denies any abdominal pain with this. She does note when this occurs she typically has an arrhythmia with it and she is being followed by cardiology for this. They're going to do a 30 day event monitor. She's not had any attacks or arrhythmias since the last attack.  Bilateral ankle edema: Patient notes for the last 2-3 months she has had swelling in her bilateral ankles. She notes this occurred after being on a statin and having myalgias. They stopped the statin and the swelling has persisted. It is intermittent. It goes away with propping her legs up and wearing compression stockings. She has no orthopnea with this. She has no history of anemia. She has no history of kidney dysfunction. She does note she had hepatitis when she was younger, though no  chronic liver issues. She has a history of a DVT in her left leg when she is 24 and on an OCP. She does not have any calf pain or swelling with this. She's not on any hormone treatment now.  Active Ambulatory Problems    Diagnosis Date Noted  . HYPERLIPIDEMIA 04/14/2010  . OTHER SPECIFIED PRURITIC CONDITIONS 04/14/2010  . Abnormal EKG 09/03/2010  . Hyperlipidemia 09/03/2010  . Obesity 09/05/2010  . HTN (hypertension) 09/05/2010  . Foot swelling 10/30/2012  . Rash 10/30/2012  . Familial hypercholesterolemia 10/30/2013  . Tachycardia 07/30/2014  . Thyroid nodule 01/26/2015  . CAD (coronary artery disease) 01/26/2015  . Gallbladder attack 01/28/2015   Resolved Ambulatory Problems    Diagnosis Date Noted  . No Resolved Ambulatory Problems   Past Medical History  Diagnosis Date  . Hypertension   . Arrhythmia   . History of DVT (deep vein thrombosis)     Family History  Problem Relation Age of Onset  . Heart disease Mother   . Heart disease Father   . Colon cancer      Grandparent  . Thyroid cancer Brother     Social History   Social History  . Marital Status: Married    Spouse Name: N/A  . Number of Children: N/A  . Years of Education: N/A   Occupational History  . Not on file.   Social History Main Topics  . Smoking status: Never Smoker   . Smokeless tobacco: Not on file  . Alcohol Use:  No  . Drug Use: No  . Sexual Activity: Not on file   Other Topics Concern  . Not on file   Social History Narrative    ROS   General:  Negative for unexplained weight loss, fever Skin: Negative for new or changing mole, sore that won't heal HEENT: Negative for trouble hearing, trouble seeing, ringing in ears, mouth sores, hoarseness, change in voice, dysphagia. CV:  Positive for ankle swelling, palpitations Negative for chest pain, dyspnea Resp: Negative for cough, dyspnea, hemoptysis GI: Negative for nausea, vomiting, diarrhea, constipation, abdominal pain, melena,  hematochezia. GU: Negative for dysuria, incontinence, urinary hesitance, hematuria, vaginal or penile discharge, polyuria, sexual difficulty, lumps in testicle or breasts MSK: Negative for muscle cramps or aches, joint pain or swelling Neuro: Negative for headaches, weakness, numbness, dizziness, passing out/fainting Psych: Negative for depression, anxiety, memory problems  Objective  Physical Exam Filed Vitals:   01/28/15 0848  BP: 116/66  Pulse: 59  Temp: 97.6 F (36.4 C)    Physical Exam  Constitutional: She is well-developed, well-nourished, and in no distress.  HENT:  Head: Normocephalic and atraumatic.  Right Ear: External ear normal.  Left Ear: External ear normal.  Mouth/Throat: Oropharynx is clear and moist. No oropharyngeal exudate.  Bilateral ear canals with cerumen  Eyes: Conjunctivae are normal. Pupils are equal, round, and reactive to light.  Neck: Neck supple. No thyromegaly present.  No palpable thyroid nodule  Cardiovascular: Normal rate, regular rhythm and normal heart sounds.  Exam reveals no gallop and no friction rub.   No murmur heard. Pulmonary/Chest: Effort normal and breath sounds normal. No respiratory distress. She has no wheezes. She has no rales.  Abdominal: Soft. Bowel sounds are normal. She exhibits no distension. There is no tenderness. There is no rebound and no guarding.  Musculoskeletal:  No midline spine tenderness or step-off, no muscular back tenderness, no scapular tenderness, no side tenderness, no swelling in her back  Lymphadenopathy:    She has no cervical adenopathy.  Neurological: She is alert. Gait normal.  Skin: Skin is warm and dry. She is not diaphoretic.  Psychiatric: Mood and affect normal.   no calf tenderness or cords or calf erythema Bilateral warm and well perfused   Assessment/Plan:   Foot swelling Suspect this is related to venous insufficiency. No signs of DVT on exam. Would be unlikely to be DVT given bilateral  swelling and lack of calf tenderness. We will check thyroid function, renal function, liver function, and CBC to rule out other causes. She'll continue to elevate her legs and wear compression stockings. She is given return precautions.  Thyroid nodule Noted on recent CT scan. She does have a history of malignant thyroid nodules in her family. We will check thyroid function tests and order an ultrasound of her thyroid. If the ultrasound does reveal a nodule we will refer her to ENT.  Gallbladder attack Patient reports a long history of what she describes as gallbladder attacks. She has no abdominal pain with this, though does have some shoulder discomfort that could be related to her gallbladder given its association with eating fatty foods. Her abdominal exam and back exam are benign today. We will Check an ultrasound of her abdomen to evaluate her gallbladder and for other structural issues. We'll also check liver function tests. She is given return precautions.    Orders Placed This Encounter  Procedures  . US Soft Tissue Head/Neck    Standing Status: Future     Number of  Occurrences:      Standing Expiration Date: 03/29/2016    Order Specific Question:  Reason for Exam (SYMPTOM  OR DIAGNOSIS REQUIRED)    Answer:  thyroid nodule, family history of thyroid cancer    Order Specific Question:  Preferred imaging location?    Answer:  [facility]  . US Abdomen Complete    Standing Status: Future     Number of Occurrences:      Standing Expiration Date: 03/29/2016    Order Specific Question:  Reason for Exam (SYMPTOM  OR DIAGNOSIS REQUIRED)    Answer:  thyroid nodule, family history of thyroid cancer    Order Specific Question:  [facility]?    [facility]  . TSH  . T4, free  . T3, free  . Comp Met (CMET)  . CBC    Dawn Herring

## 2015-01-28 NOTE — Assessment & Plan Note (Signed)
Suspect this is related to venous insufficiency. No signs of DVT on exam. Would be unlikely to be DVT given bilateral swelling and lack of calf tenderness. We will check thyroid function, renal function, liver function, and CBC to rule out other causes. She'll continue to elevate her legs and wear compression stockings. She is given return precautions.

## 2015-02-03 ENCOUNTER — Telehealth: Payer: Self-pay | Admitting: *Deleted

## 2015-02-03 ENCOUNTER — Ambulatory Visit
Admission: RE | Admit: 2015-02-03 | Discharge: 2015-02-03 | Disposition: A | Discharge status: Home / Self Care | Payer: Medicare Other | Source: Ambulatory Visit | Attending: Family Medicine | Admitting: Family Medicine

## 2015-02-03 DIAGNOSIS — K760 Fatty (change of) liver, not elsewhere classified: Secondary | ICD-10-CM | POA: Diagnosis not present

## 2015-02-03 DIAGNOSIS — E042 Nontoxic multinodular goiter: Secondary | ICD-10-CM | POA: Insufficient documentation

## 2015-02-03 DIAGNOSIS — R1011 Right upper quadrant pain: Secondary | ICD-10-CM | POA: Insufficient documentation

## 2015-02-03 DIAGNOSIS — E041 Nontoxic single thyroid nodule: Secondary | ICD-10-CM

## 2015-02-03 DIAGNOSIS — K8050 Calculus of bile duct without cholangitis or cholecystitis without obstruction: Secondary | ICD-10-CM

## 2015-02-03 NOTE — Telephone Encounter (Signed)
Patient requested a call back for result of her ultrasound from today.

## 2015-02-04 ENCOUNTER — Other Ambulatory Visit: Payer: Self-pay | Admitting: Family Medicine

## 2015-02-04 DIAGNOSIS — E041 Nontoxic single thyroid nodule: Secondary | ICD-10-CM

## 2015-02-04 NOTE — Telephone Encounter (Signed)
Spoke with patient and gave US results.

## 2015-02-10 DIAGNOSIS — E042 Nontoxic multinodular goiter: Secondary | ICD-10-CM | POA: Diagnosis not present

## 2015-02-10 DIAGNOSIS — H6123 Impacted cerumen, bilateral: Secondary | ICD-10-CM | POA: Diagnosis not present

## 2015-02-17 ENCOUNTER — Other Ambulatory Visit: Payer: Self-pay | Admitting: Otolaryngology

## 2015-02-17 DIAGNOSIS — E041 Nontoxic single thyroid nodule: Secondary | ICD-10-CM

## 2015-02-26 ENCOUNTER — Other Ambulatory Visit (HOSPITAL_COMMUNITY)
Admission: RE | Admit: 2015-02-26 | Discharge: 2015-02-26 | Disposition: A | Discharge status: Home / Self Care | Payer: Medicare Other | Source: Ambulatory Visit | Attending: Radiology | Admitting: Radiology

## 2015-02-26 ENCOUNTER — Ambulatory Visit
Admission: RE | Admit: 2015-02-26 | Discharge: 2015-02-26 | Disposition: A | Discharge status: Home / Self Care | Payer: Medicare Other | Source: Ambulatory Visit | Attending: Otolaryngology | Admitting: Otolaryngology

## 2015-02-26 DIAGNOSIS — E041 Nontoxic single thyroid nodule: Secondary | ICD-10-CM | POA: Diagnosis not present

## 2015-03-03 ENCOUNTER — Ambulatory Visit (INDEPENDENT_AMBULATORY_CARE_PROVIDER_SITE_OTHER)
Admission: RE | Admit: 2015-03-03 | Discharge: 2015-03-03 | Disposition: A | Discharge status: Home / Self Care | Payer: Medicare Other | Source: Ambulatory Visit | Attending: Family Medicine | Admitting: Family Medicine

## 2015-03-03 ENCOUNTER — Ambulatory Visit (INDEPENDENT_AMBULATORY_CARE_PROVIDER_SITE_OTHER): Payer: Medicare Other | Admitting: Family Medicine

## 2015-03-03 ENCOUNTER — Encounter: Payer: Self-pay | Admitting: Family Medicine

## 2015-03-03 VITALS — BP 108/64 | HR 73 | Temp 97.9°F | Ht 66.0 in | Wt 188.6 lb

## 2015-03-03 DIAGNOSIS — Z23 Encounter for immunization: Secondary | ICD-10-CM

## 2015-03-03 DIAGNOSIS — R000 Tachycardia, unspecified: Secondary | ICD-10-CM | POA: Diagnosis not present

## 2015-03-03 DIAGNOSIS — M79671 Pain in right foot: Secondary | ICD-10-CM

## 2015-03-03 DIAGNOSIS — M7989 Other specified soft tissue disorders: Secondary | ICD-10-CM | POA: Diagnosis not present

## 2015-03-03 DIAGNOSIS — M7731 Calcaneal spur, right foot: Secondary | ICD-10-CM | POA: Diagnosis not present

## 2015-03-03 DIAGNOSIS — K760 Fatty (change of) liver, not elsewhere classified: Secondary | ICD-10-CM | POA: Diagnosis not present

## 2015-03-03 NOTE — Progress Notes (Signed)
Pre visit review using our clinic review tool, if applicable. No additional management support is needed unless otherwise documented below in the visit note. 

## 2015-03-03 NOTE — Patient Instructions (Signed)
Nice to see you. We will place you in a boot for your right foot. Please go get the x-rays of her right foot and ankle. We'll obtain lab work to evaluate for your fatty liver. We will call with the results. He should minimize alcohol intake given her fatty liver. If you develop any abdominal pain, palpitations, chest pain or shortness of breath, increased swelling, pain, or any new or change in symptoms please seek medical attention.

## 2015-03-03 NOTE — Assessment & Plan Note (Signed)
Suspect swelling is still likely related to venous insufficiency, though patient does have an area of tenderness in her right mid foot on the medial aspect that could be a structural cause of the swelling at least in that foot. Given the area of tenderness near the navicular we will obtain x-rays of her right foot and ankle to evaluate for arthritis versus stress fracture. We'll place her in a cam walker to rest the ankle and foot. Her prior workup did not reveal any lab abnormalities that could be causing this. She does have fatty liver disease, though has no apparent liver dysfunction on lab testing. She will continue compression stockings and elevation of her legs. We will see if this improves with rest of her foot. She is given return precautions.

## 2015-03-03 NOTE — Assessment & Plan Note (Addendum)
Found on ultrasound of right upper quadrant. She's had no further "gallbladder attacks." Patient does have cholesterol issues and is overweight. She did have a history of hepatitis in seventh grade. This could be dietary/lifestyle/hereditary related or related to that episode of hepatitis. Discussed at length treating her cholesterol and losing weight. She is attempting to lose weight at this time. We will check her for hepatitis A, B, and C immunity and exposure today. She'll continue to attempt to lose weight.

## 2015-03-03 NOTE — Progress Notes (Signed)
Near Patient ID: Dawn Herring, female   DOB: 01-09-1946, 69 y.o.   MRN: [id number]  Tommi Rumps, MD Phone: [phone]  Dawn Herring is a 69 y.o. female who presents today for follow-up.  Right foot pain: Patient notes intermittently for a number of years she is had discomfort in her right foot and left foot. She notes this comes and goes. She notes intermittent swelling in her feet with this. She notes over the last week the pain in her right foot is gotten worse. It is near the navicular bone. In the past they have thought it was gout and treated with colchicine, though this does not feel like gout to her. She has no erythema of the foot. Has no trouble bearing weight. Getting off her feet helps with this. She notes the swelling goes down at night. She had lab workup for foot swelling her last visit that was unrevealing.  Fatty liver: Patient was noted to have fatty liver on recent right upper quadrant ultrasound. She's not had any additional "gallbladder attacks." She has no abdominal pain. She does note she had hepatitis in seventh grade. She has been on zetia and praluent recently. She was on fluvastatin in combination with these other medications prior to that. Previously she had difficulty tolerating statins. She has lost 10 pounds recently in an attempt to lose weight.  Arrhythmia: Patient notes she is followed by cardiology for this. She had completed a Holter monitor last Friday and send it in for evaluation. She had one episode of fluttering while wearing the Holter monitor. She received a call when this occurred. She had no chest pain or shortness of breath with it. It lasted briefly. She has no other symptoms with this. She's not had any fluttering since that time.  Additionally notes that she had a thyroid biopsy that was negative for malignant cause.  PMH: nonsmoker.   ROS see history of present illness  Objective  Physical Exam Filed Vitals:   03/03/15 0848  BP: 108/64    Pulse: 73  Temp: 97.9 F (36.6 C)    BP Readings from Last 3 Encounters:  03/03/15 108/64  01/28/15 116/66  01/26/15 118/62   Wt Readings from Last 3 Encounters:  03/03/15 188 lb 9.6 oz (85.548 kg)  01/28/15 193 lb (87.544 kg)  01/26/15 193 lb 8 oz (87.771 kg)    Physical Exam  Constitutional: She is well-developed, well-nourished, and in no distress.  HENT:  Head: Normocephalic and atraumatic.  Right Ear: External ear normal.  Left Ear: External ear normal.  Mouth/Throat: Oropharynx is clear and moist. No oropharyngeal exudate.  Cardiovascular: Normal rate, regular rhythm and normal heart sounds.  Exam reveals no gallop and no friction rub.   No murmur heard. Pulmonary/Chest: Effort normal and breath sounds normal. No respiratory distress. She has no wheezes. She has no rales.  Abdominal: Soft. Bowel sounds are normal. She exhibits no distension. There is no tenderness. There is no rebound and no guarding.  Musculoskeletal:  Trace edema in bilateral feet right mildly greater than left, there is tenderness over the right medial midfoot, there is no specific increased swelling in this area, there is no erythema, there is no other tenderness in her foot, there is no tenderness in her left foot, both ankles are full range of motion and have no tenderness in either malleoli, feet are warm and well perfused, 2+ DP pulses bilaterally  Neurological: She is alert. Gait normal.  5 out of 5 strength  in bilateral plantar flexion, dorsiflexion, inversion, and eversion, sensation to light touch intact in bilateral feet  Skin: Skin is warm and dry. She is not diaphoretic.     Assessment/Plan: Please see individual problem list.  Foot swelling Suspect swelling is still likely related to venous insufficiency, though patient does have an area of tenderness in her right mid foot on the medial aspect that could be a structural cause of the swelling at least in that foot. Given the area of  tenderness near the navicular we will obtain x-rays of her right foot and ankle to evaluate for arthritis versus stress fracture. We'll place her in a cam walker to rest the ankle and foot. Her prior workup did not reveal any lab abnormalities that could be causing this. She does have fatty liver disease, though has no apparent liver dysfunction on lab testing. She will continue compression stockings and elevation of her legs. We will see if this improves with rest of her foot. She is given return precautions.  Non-alcoholic fatty liver disease Found on ultrasound of right upper quadrant. She's had no further "gallbladder attacks." Patient does have cholesterol issues and is overweight. She did have a history of hepatitis in seventh grade. This could be dietary/lifestyle/hereditary related or related to that episode of hepatitis. Discussed at length treating her cholesterol and losing weight. She is attempting to lose weight at this time. We will check her for hepatitis A, B, and C immunity and exposure today. She'll continue to attempt to lose weight.  Tachycardia Recently turned in her Holter monitor. She states she only had one episode of fluttering while on it. No other symptoms. We will await results from cardiology. Given return precautions.    Orders Placed This Encounter  Procedures  . DG Foot Complete Right    Standing Status: Future     Number of Occurrences: 1     Standing Expiration Date: 05/03/2016    Order Specific Question:  Reason for Exam (SYMPTOM  OR DIAGNOSIS REQUIRED)    Answer:  right foot pain chronic, worse over the past week, no injury    Order Specific Question:  Preferred imaging location?    Answer:  [facility]  . DG Ankle Complete Right    Standing Status: Future     Number of Occurrences: 1     Standing Expiration Date: 05/03/2016    Order Specific Question:  Reason for Exam (SYMPTOM  OR DIAGNOSIS REQUIRED)    Answer:  right foot pain chronic, worse over the  past week, no injury    Order Specific Question:  Preferred imaging location?    Answer:  [facility]  . Flu Vaccine QUAD 36+ mos IM  . Hepatitis C Antibody  . Hepatitis B Surface AntiBODY  . Hepatitis B Surface AntiGEN  . Hepatitis B Core Antibody, total  . Hepatitis A Ab, Total    Tommi Rumps

## 2015-03-03 NOTE — Assessment & Plan Note (Signed)
Recently turned in her Holter monitor. She states she only had one episode of fluttering while on it. No other symptoms. We will await results from cardiology. Given return precautions.

## 2015-03-04 LAB — HEPATITIS C ANTIBODY: HCV AB: NEGATIVE

## 2015-03-04 LAB — HEPATITIS A ANTIBODY, TOTAL: Hep A Total Ab: REACTIVE — AB

## 2015-03-04 LAB — HEPATITIS B SURFACE ANTIBODY,QUALITATIVE: Hep B S Ab: NEGATIVE

## 2015-03-04 LAB — HEPATITIS B SURFACE ANTIGEN: Hepatitis B Surface Ag: NEGATIVE

## 2015-03-04 LAB — HEPATITIS B CORE ANTIBODY, TOTAL: Hep B Core Total Ab: NONREACTIVE

## 2015-03-06 ENCOUNTER — Telehealth: Payer: Self-pay | Admitting: *Deleted

## 2015-03-06 NOTE — Telephone Encounter (Signed)
Please advise 

## 2015-03-06 NOTE — Telephone Encounter (Signed)
LM for patient to call 

## 2015-03-06 NOTE — Telephone Encounter (Signed)
Patient requested lab results from 12/06/jjb Please Advise

## 2015-03-10 NOTE — Telephone Encounter (Signed)
Notified patient of lab results 

## 2015-03-17 ENCOUNTER — Ambulatory Visit (INDEPENDENT_AMBULATORY_CARE_PROVIDER_SITE_OTHER): Payer: Medicare Other | Admitting: Family Medicine

## 2015-03-17 ENCOUNTER — Encounter: Payer: Self-pay | Admitting: Family Medicine

## 2015-03-17 VITALS — BP 122/76 | HR 67 | Temp 98.0°F | Ht 66.0 in | Wt 184.0 lb

## 2015-03-17 DIAGNOSIS — I2510 Atherosclerotic heart disease of native coronary artery without angina pectoris: Secondary | ICD-10-CM | POA: Diagnosis not present

## 2015-03-17 DIAGNOSIS — Z23 Encounter for immunization: Secondary | ICD-10-CM

## 2015-03-17 DIAGNOSIS — K760 Fatty (change of) liver, not elsewhere classified: Secondary | ICD-10-CM | POA: Diagnosis not present

## 2015-03-17 DIAGNOSIS — R21 Rash and other nonspecific skin eruption: Secondary | ICD-10-CM

## 2015-03-17 DIAGNOSIS — M79671 Pain in right foot: Secondary | ICD-10-CM | POA: Insufficient documentation

## 2015-03-17 NOTE — Assessment & Plan Note (Addendum)
Area of rash could be consistent with fungal cause given appearance. Patient will trial over-the-counter Lamisil and see if this improves. If no improvement would consider biopsy versus prescription strength steroid. Given return precautions.

## 2015-03-17 NOTE — Progress Notes (Signed)
Patient ID: JEHLANI BREARLEY, female   DOB: [birth year], 69 y.o.   MRN: [id number]  Tommi Rumps, MD Phone: [phone]  Unborn Carreon Cajina is a 69 y.o. female who presents today for follow-up.  Right foot pain: Patient notes this is improved. Notes she has no pain when she wears the boot. Notes that she does not put the boot on she gets swelling in her ankle and foot. She notes a pulling sensation laterally where she has mild swelling. She notes pain still near the navicular bone. She does note now that she may have dropped safe on the lateral aspect of the foot and ankle lance the right side. She has been using ice and elevation. Advil occasionally.  NASH: Recently diagnosed with this on ultrasound. Patient is wondering now she really needs hepatitis B vaccination. She had immunity to hepatitis A on testing. No immunity to hepatitis B or hepatitis C. Denies abdominal pain at this time.  Rash: Patient notes an area of circular erythema on her right shin. Notes it is scaling and occasionally the scale is white. She is put steroid ointment on it. That does not help. She is putting antibiotic ointment on it. That does not help. It is not painful. She's not any fevers. It does itch mildly. She's never had this before. She has nowhere else on her body. She does note she has had some mild itching in her lower extremities that she associates with Zetia. She notes she stopped the Zetia and the itching has resolved.  PMH: nonsmoker.   ROS see history of present illness  Objective  Physical Exam Filed Vitals:   03/17/15 1055  BP: 122/76  Pulse: 67  Temp: 98 F (36.7 C)    BP Readings from Last 3 Encounters:  03/17/15 122/76  03/03/15 108/64  01/28/15 116/66   Wt Readings from Last 3 Encounters:  03/17/15 184 lb (83.462 kg)  03/03/15 188 lb 9.6 oz (85.548 kg)  01/28/15 193 lb (87.544 kg)    Physical Exam  Constitutional: She is well-developed, well-nourished, and in no distress.    Cardiovascular: Normal rate, regular rhythm and normal heart sounds.   Pulmonary/Chest: Effort normal and breath sounds normal. No respiratory distress.  Abdominal: Soft. Bowel sounds are normal. She exhibits no distension. There is no tenderness. There is no rebound and no guarding.  Musculoskeletal:  Right foot and ankle with mild edema in the lateral ankle inferior to the malleolus, there is tenderness over the right medial midfoot in the area of the navicular bone, there is no swelling in this area, there is no erythema, there is no other tenderness in her foot, there is no tenderness of either malleolus the right ankle or the fifth had a metatarsal, 2+ DP pulses right foot, sensation to light touch intact, 5 out of 5 strength in plantar flexion, dorsiflexion, inversion, and eversion  Skin: Skin is warm and dry. She is not diaphoretic.  Right mid anterior shin with a centimeter diameter circular area of mild erythema and scaling, no tenderness, no induration, no fluctuance     Assessment/Plan: Please see individual problem list.  Non-alcoholic fatty liver disease Discussed at length need for hepatitis B vaccination to help protect against further insult against her liver. She will start the series today.  Right foot pain Suspect some discomfort is related soft tissue injury and tendinous strain in the lateral aspect of her foot. Suspect the other pain in her foot is likely related to arthritic changes, though stress  fracture could be a possibility though it would be odd to have a stress fracture in the navicular. Prior x-rays were negative for fracture. Patient has been tolerating the boot well. She will remain in this. She will continue to ice and elevate. Discussed that if not improved and the worry continues to be for possible stress fracture we could repeat imaging in 2-3 weeks versus continue to monitor. If continues to be an issue would consider referral to sports medicine.  Rash Area  of rash could be consistent with fungal cause given appearance. Patient will trial over-the-counter Lamisil and see if this improves. If no improvement would consider biopsy versus prescription strength steroid. Given return precautions.    Orders Placed This Encounter  Procedures  . Hepatitis B vaccine adult IM    Dragon voice recognition software was used during the dictation process of this note. If any phrases or words seem inappropriate it is likely secondary to the translation process being inefficient.  Tommi Rumps

## 2015-03-17 NOTE — Assessment & Plan Note (Signed)
Discussed at length need for hepatitis B vaccination to help protect against further insult against her liver. She will start the series today.

## 2015-03-17 NOTE — Assessment & Plan Note (Signed)
Suspect some discomfort is related soft tissue injury and tendinous strain in the lateral aspect of her foot. Suspect the other pain in her foot is likely related to arthritic changes, though stress fracture could be a possibility though it would be odd to have a stress fracture in the navicular. Prior x-rays were negative for fracture. Patient has been tolerating the boot well. She will remain in this. She will continue to ice and elevate. Discussed that if not improved and the worry continues to be for possible stress fracture we could repeat imaging in 2-3 weeks versus continue to monitor. If continues to be an issue would consider referral to sports medicine.

## 2015-03-17 NOTE — Progress Notes (Signed)
Pre visit review using our clinic review tool, if applicable. No additional management support is needed unless otherwise documented below in the visit note. 

## 2015-03-17 NOTE — Patient Instructions (Signed)
Nice to see you.  Please continue to wear the boot.  you can continue to ice and elevate as well.   please try Lamisil on the skin lesion on her right leg.  please monitor your foot pain. He gets worse let us know.  if you develop increased swelling, pain , fever, spreading rash, or any new or changing symptoms please let us know.

## 2015-03-29 ENCOUNTER — Other Ambulatory Visit: Payer: Self-pay | Admitting: Cardiovascular Disease

## 2015-04-07 ENCOUNTER — Ambulatory Visit: Payer: Medicare Other | Admitting: Family Medicine

## 2015-04-10 ENCOUNTER — Telehealth: Payer: Self-pay

## 2015-04-10 NOTE — Telephone Encounter (Signed)
States they received pt enrollment forms and whole left side is faded out. Please re fax [phone]

## 2015-04-20 ENCOUNTER — Telehealth: Payer: Self-pay | Admitting: Cardiovascular Disease

## 2015-04-20 DIAGNOSIS — E785 Hyperlipidemia, unspecified: Secondary | ICD-10-CM

## 2015-04-20 NOTE — Telephone Encounter (Signed)
I'll put the order in to check her cholesterol (liver & lipids). Thank you!

## 2015-04-20 NOTE — Telephone Encounter (Signed)
Patient wants to have routine labs drawn tomorrow but there are no orders.  She says Dr. Rockey Situ  Draws labs before every visit.  Please call patient or let me know i will call her.

## 2015-04-21 ENCOUNTER — Ambulatory Visit (INDEPENDENT_AMBULATORY_CARE_PROVIDER_SITE_OTHER): Payer: Medicare Other

## 2015-04-21 DIAGNOSIS — Z23 Encounter for immunization: Secondary | ICD-10-CM

## 2015-04-21 NOTE — Progress Notes (Signed)
Patient came in for second hepatits B immunization. Received in Left deltoid.  Patient tolerated well.

## 2015-04-27 ENCOUNTER — Telehealth: Payer: Self-pay | Admitting: Pharmacist

## 2015-04-27 MED ORDER — EVOLOCUMAB 140 MG/ML ~~LOC~~ SOAJ: 1.0000 "pen " | SUBCUTANEOUS | Status: DC

## 2015-04-27 NOTE — Telephone Encounter (Signed)
Pt returned phone call - she just got a shipment of Praluent last week (of note, she pays >$500 for her monthly copay). She says she has 2-4 injections left at home. Discussed switching her from Praluent to [place] due to insurance preferences. Informed her that she should receive a phone call from [facility] within the next week or so to set up delivery of Repatha injections - she will call if she does not hear from them by then.

## 2015-04-27 NOTE — Telephone Encounter (Signed)
Pt has new insurance with [employer] for 2017. She had been receiving samples of Praluent from Dr. Donivan Scull office. Her insurance prefers Repatha over [employer]. PA sent and approved - LMOM for pt to return call about switching to Repatha. Rx for Repatha sent in to [facility].

## 2015-04-28 ENCOUNTER — Encounter: Payer: Self-pay | Admitting: Nurse Practitioner

## 2015-04-28 ENCOUNTER — Ambulatory Visit (INDEPENDENT_AMBULATORY_CARE_PROVIDER_SITE_OTHER): Payer: Medicare Other | Admitting: Nurse Practitioner

## 2015-04-28 ENCOUNTER — Ambulatory Visit: Payer: Medicare Other | Admitting: Cardiovascular Disease

## 2015-04-28 VITALS — BP 134/76 | HR 54 | Ht 66.0 in | Wt 180.8 lb

## 2015-04-28 DIAGNOSIS — I10 Essential (primary) hypertension: Secondary | ICD-10-CM

## 2015-04-28 DIAGNOSIS — R002 Palpitations: Secondary | ICD-10-CM

## 2015-04-28 DIAGNOSIS — I4891 Unspecified atrial fibrillation: Secondary | ICD-10-CM | POA: Insufficient documentation

## 2015-04-28 DIAGNOSIS — E785 Hyperlipidemia, unspecified: Secondary | ICD-10-CM

## 2015-04-28 DIAGNOSIS — I499 Cardiac arrhythmia, unspecified: Secondary | ICD-10-CM | POA: Insufficient documentation

## 2015-04-28 NOTE — Progress Notes (Signed)
Office Visit    Patient Name: Dawn Herring Date of Encounter: 04/28/2015  Primary Care Provider:  Tommi Rumps, MD Primary Cardiologist:  Johnny Bridge, MD   Chief Complaint    70 year old female with a history of hypertension, hyperlipidemia and chest pain who presents for follow-up.  Past Medical History    Past Medical History  Diagnosis Date  . Essential hypertension   . Hyperlipidemia     a. statin intolerant;  b. on zetia and repatha.  . Palpitations     a. 02/2015 Event Monitor: NSR w/ rare, short episodes of A Tach, rare short runs of SVT < 15 beats.  . History of DVT (deep vein thrombosis)   . Chest pain     a. Neg stress tests - 2005, 2008, 2009;  b. 08/2014 Cardiac CT: Ca score 193 (80th%'ile).  . Hiatal hernia     a. 08/2014 CT chest: large hiatal hernia.   Past Surgical History  Procedure Laterality Date  . Vaginal hysterectomy      abdominal hysterectomy  . Root canal  2013  . Root canal      Allergies  Allergies  Allergen Reactions  . Statins     REACTION: Has terrible reactions; abdominal pain    History of Present Illness    70 year old female with the above past medical history. She has a history of chest pain with multiple negative stress tests. Cardiac CT revealed a calcium score of 193, placing her in the 80th percentile for her age group. Following that finding, a more aggressive approach was taken to lipid lowering in the setting of chronic hyperlipidemia and family hyperlipidemia. Unfortunately, she is intolerant to statins but does tolerate Zetia and most recently has been on repatha.  She was notified recently by her insurance company and our pharmacist, that she will need to be switched to pralulent due to insurance coverage.  She also has a history of palpitations and wore an event monitor late last year revealing short episodes of atrial tachycardia and rare, short runs of SVT. She occasionally notes palpitations which are typically  lasting just a few seconds and resolving spontaneously. Overall, she feels that she is doing reasonably well. She denies chest pain or dyspnea. She has lost a fair amount of weight since last fall, by cutting back on carbohydrates. She denies PND, orthopnea, dizziness, syncope, edema, or early satiety.  Home Medications    Prior to Admission medications   Medication Sig Start Date End Date Taking? Authorizing Provider  aspirin 81 MG tablet Take 162 mg by mouth daily.   Yes Historical Provider, MD  colchicine 0.6 MG tablet Take 1 tablet (0.6 mg total) by mouth daily. As needed for gout flare 11/06/12  Yes Minna Merritts, MD  Evolocumab (REPATHA SURECLICK) [id number] MG/ML SOAJ Inject 1 pen into the skin every 14 (fourteen) days. 04/27/15  Yes Minna Merritts, MD  ezetimibe (ZETIA) 10 MG tablet Take 1 tablet (10 mg total) by mouth daily. 07/30/14  Yes Minna Merritts, MD  omeprazole (PRILOSEC) 20 MG capsule Take 20 mg by mouth daily.     Yes Historical Provider, MD  valsartan-hydrochlorothiazide (DIOVAN-HCT) 160-25 MG tablet TAKE 1 TABLET BY MOUTH EVERY DAY 03/31/15  Yes Minna Merritts, MD    Review of Systems    As above, she is been doing reasonably well. She does occasionally have brief palpitations. She denies chest pain, dyspnea, PND, orthopnea, dizziness, syncope, edema or early satiety.  All  other systems reviewed and are otherwise negative except as noted above.  Physical Exam    VS:  BP 134/76 mmHg  Pulse 54  Ht 5\' 6"  (1.676 m)  Wt 180 lb 12.8 oz (82.01 kg)  BMI 29.20 kg/m2  SpO2 98% , BMI Body mass index is 29.2 kg/(m^2). GEN: Well nourished, well developed, in no acute distress. HEENT: normal. Neck: Supple, no JVD, carotid bruits, or masses. Cardiac: RRR, no murmurs, rubs, or gallops. No clubbing, cyanosis, edema.  Radials/DP/PT 2+ and equal bilaterally.  Respiratory:  Respirations regular and unlabored, clear to auscultation bilaterally. GI: Soft, nontender, nondistended, BS + x  4. MS: no deformity or atrophy. Skin: warm and dry, no rash. Neuro:  Strength and sensation are intact. Psych: Normal affect.  Accessory Clinical Findings    Holter monitor reviewed.  Lab Results  Component Value Date   CHOL 181 01/21/2015   HDL 51 01/21/2015   LDLCALC 70 01/21/2015   TRIG 302* 01/21/2015   CHOLHDL 3.5 01/21/2015    Lab Results  Component Value Date   ALT 14 01/28/2015   AST 19 01/28/2015   ALKPHOS 58 01/28/2015   BILITOT 0.4 01/28/2015     Assessment & Plan    1.  Essential hypertension: Blood pressure is stable on valsartan-HCTZ. No changes required today.   2. Hyperlipidemia with a history of familial hypercholesterolemia: She is intolerant to statins and is currently on Zetia and repatha.  Due to insurance coverage changes, she will be switched to pralulent by our pharmacist in lipid clinic. In October, her LDL was 70. Triglycerides were 302. Since then, she has lost a fair amount of weight in the setting of restricting carbohydrates.  3. Palpitations: Stable. Event monitoring revealed brief runs of atrial tachycardia and SVT. She has not had any increase or worsening of symptoms.   4. Disposition: Follow-up with Dr. Rockey Situ in one year or sooner if necessary.  Murray Hodgkins, NP 04/28/2015, 3:28 PM

## 2015-04-28 NOTE — Patient Instructions (Signed)
Medication Instructions:  Your physician recommends that you continue on your current medications as directed. Please refer to the Current Medication list given to you today.   Labwork: none  Testing/Procedures: none  Follow-Up: Your physician wants you to follow-up in: one year with Dr. Gollan.  You will receive a reminder letter in the mail two months in advance. If you don't receive a letter, please call our office to schedule the follow-up appointment.   Any Other Special Instructions Will Be Listed Below (If Applicable).     If you need a refill on your cardiac medications before your next appointment, please call your pharmacy.   

## 2015-04-30 ENCOUNTER — Telehealth: Payer: Self-pay | Admitting: *Deleted

## 2015-04-30 NOTE — Telephone Encounter (Signed)
Jasmina calling from [facility] calling needing to know Allergies and DX code for the patina Reptha

## 2015-05-01 NOTE — Telephone Encounter (Signed)
Spoke with [employer] and verified allergies and ICD-10 codes.

## 2015-05-26 ENCOUNTER — Other Ambulatory Visit: Payer: Self-pay | Admitting: Cardiovascular Disease

## 2015-05-27 NOTE — Telephone Encounter (Signed)
.   Requested Prescriptions   Pending Prescriptions Disp Refills  . ezetimibe (ZETIA) 10 MG tablet [Pharmacy Med Name: EZETIMIBE 10MG  TABLETS] 90 tablet 3    Sig: TAKE 1 TABLET BY MOUTH EVERY DAY

## 2015-09-15 ENCOUNTER — Ambulatory Visit (INDEPENDENT_AMBULATORY_CARE_PROVIDER_SITE_OTHER): Payer: Medicare Other

## 2015-09-15 DIAGNOSIS — Z23 Encounter for immunization: Secondary | ICD-10-CM | POA: Diagnosis not present

## 2015-09-15 DIAGNOSIS — K760 Fatty (change of) liver, not elsewhere classified: Secondary | ICD-10-CM | POA: Diagnosis not present

## 2015-11-03 ENCOUNTER — Telehealth: Payer: Self-pay | Admitting: Cardiovascular Disease

## 2015-11-03 DIAGNOSIS — E785 Hyperlipidemia, unspecified: Secondary | ICD-10-CM

## 2015-11-03 NOTE — Telephone Encounter (Signed)
Please review

## 2015-11-03 NOTE — Telephone Encounter (Signed)
[facility] calling just to give [place] an update Repatha auth that we are working on. In order for Re-auth they needs [place] to send Documentation of evidence clinical beneficial response  (IN LDL/C before starting repatha and a recent [facility])  Please respond/be on the lookout for a fax they are sending over.

## 2015-11-06 NOTE — Telephone Encounter (Signed)
Please review and call patient regarding Repatha.

## 2015-11-06 NOTE — Telephone Encounter (Signed)
[employer] calling to check status of Auth for Repatha.  Please submit asap.

## 2015-11-09 NOTE — Telephone Encounter (Signed)
[employer] calling again to check status .  Aware that patient will have to check ldl.  Will note.

## 2015-11-09 NOTE — Telephone Encounter (Signed)
Pt called back She is coming to do labs 08/15

## 2015-11-09 NOTE — Telephone Encounter (Signed)
Reviewed pt's chart.  Needs new LDL before submitting reauthorization.  LMOM for patient to schedule lab appointment.

## 2015-11-10 ENCOUNTER — Other Ambulatory Visit (INDEPENDENT_AMBULATORY_CARE_PROVIDER_SITE_OTHER): Payer: Medicare Other | Admitting: *Deleted

## 2015-11-10 ENCOUNTER — Other Ambulatory Visit: Payer: Self-pay | Admitting: *Deleted

## 2015-11-10 DIAGNOSIS — E785 Hyperlipidemia, unspecified: Secondary | ICD-10-CM | POA: Diagnosis not present

## 2015-11-10 NOTE — Addendum Note (Signed)
Addended by: Dede Query R on: 11/10/2015 08:10 AM   Modules accepted: Orders

## 2015-11-11 LAB — HEPATIC FUNCTION PANEL
ALK PHOS: 61 IU/L (ref 39–117)
ALT: 13 IU/L (ref 0–32)
AST: 17 IU/L (ref 0–40)
Albumin: 4.1 g/dL (ref 3.5–4.8)
BILIRUBIN TOTAL: 0.4 mg/dL (ref 0.0–1.2)
BILIRUBIN, DIRECT: 0.08 mg/dL (ref 0.00–0.40)
Total Protein: 7 g/dL (ref 6.0–8.5)

## 2015-11-11 LAB — LIPID PANEL
CHOLESTEROL TOTAL: 161 mg/dL (ref 100–199)
Chol/HDL Ratio: 3.5 ratio units (ref 0.0–4.4)
HDL: 46 mg/dL (ref >39)
TRIGLYCERIDES: 432 mg/dL — AB (ref 0–149)

## 2015-11-14 ENCOUNTER — Encounter: Payer: Self-pay | Admitting: Cardiovascular Disease

## 2015-11-16 ENCOUNTER — Telehealth: Payer: Self-pay | Admitting: Cardiovascular Disease

## 2015-11-16 NOTE — Telephone Encounter (Signed)
Pt calling asking about her labs she did last week. Please call back.

## 2015-11-17 ENCOUNTER — Other Ambulatory Visit: Payer: Self-pay | Admitting: *Deleted

## 2015-11-17 ENCOUNTER — Telehealth: Payer: Self-pay | Admitting: Cardiovascular Disease

## 2015-11-17 MED ORDER — EVOLOCUMAB 140 MG/ML ~~LOC~~ SOAJ: 1.0000 "pen " | SUBCUTANEOUS | 11 refills | Status: DC

## 2015-11-17 NOTE — Telephone Encounter (Signed)
*  STAT* If patient is at the pharmacy, call can be transferred to refill team.   1. Which medications need to be refilled? (please list name of each medication and dose if known) Evolocumab (REPATHA SURECLICK) [id number] MG/ML SOAJ  2. Which pharmacy/location (including street and city if local pharmacy) is medication to be sent to? [facility]  3. Do they need a 30 day or 90 day supply? 90 day    [employer] calling on prior auth!

## 2015-11-17 NOTE — Telephone Encounter (Signed)
Total cholesterol uis good, 160 Triglycerides bare very high, >400 Would watch the diet, increase exercise Decrease carbs

## 2015-11-18 NOTE — Telephone Encounter (Signed)
Reviewed recommendations from Dr. Rockey Situ and lipid profile numbers. Instructed her to watch diet, decrease carbs, and increase exercise. She verbalized understanding of our conversation and had no further questions at this time.

## 2015-12-02 ENCOUNTER — Encounter (HOSPITAL_COMMUNITY)
Admission: EM | Disposition: A | Discharge status: Home / Self Care | Payer: Self-pay | Source: Home / Self Care | Attending: Emergency Medicine

## 2015-12-02 ENCOUNTER — Encounter (HOSPITAL_COMMUNITY): Payer: Self-pay | Admitting: Emergency Medicine

## 2015-12-02 ENCOUNTER — Emergency Department (HOSPITAL_COMMUNITY): Payer: Medicare Other

## 2015-12-02 ENCOUNTER — Observation Stay (HOSPITAL_COMMUNITY): Payer: Medicare Other | Admitting: Certified Registered"

## 2015-12-02 ENCOUNTER — Observation Stay (HOSPITAL_COMMUNITY)
Admission: EM | Admit: 2015-12-02 | Discharge: 2015-12-03 | Disposition: A | Discharge status: Home / Self Care | Payer: Medicare Other | Attending: Surgery | Admitting: Surgery

## 2015-12-02 DIAGNOSIS — Z86718 Personal history of other venous thrombosis and embolism: Secondary | ICD-10-CM | POA: Insufficient documentation

## 2015-12-02 DIAGNOSIS — Z79899 Other long term (current) drug therapy: Secondary | ICD-10-CM | POA: Diagnosis not present

## 2015-12-02 DIAGNOSIS — I2510 Atherosclerotic heart disease of native coronary artery without angina pectoris: Secondary | ICD-10-CM | POA: Diagnosis not present

## 2015-12-02 DIAGNOSIS — R109 Unspecified abdominal pain: Secondary | ICD-10-CM | POA: Diagnosis present

## 2015-12-02 DIAGNOSIS — E785 Hyperlipidemia, unspecified: Secondary | ICD-10-CM | POA: Insufficient documentation

## 2015-12-02 DIAGNOSIS — K3580 Unspecified acute appendicitis: Secondary | ICD-10-CM | POA: Diagnosis present

## 2015-12-02 DIAGNOSIS — E669 Obesity, unspecified: Secondary | ICD-10-CM | POA: Diagnosis not present

## 2015-12-02 DIAGNOSIS — I10 Essential (primary) hypertension: Secondary | ICD-10-CM | POA: Insufficient documentation

## 2015-12-02 DIAGNOSIS — K352 Acute appendicitis with generalized peritonitis: Secondary | ICD-10-CM | POA: Diagnosis not present

## 2015-12-02 DIAGNOSIS — E7801 Familial hypercholesterolemia: Secondary | ICD-10-CM | POA: Insufficient documentation

## 2015-12-02 DIAGNOSIS — K449 Diaphragmatic hernia without obstruction or gangrene: Secondary | ICD-10-CM | POA: Diagnosis not present

## 2015-12-02 DIAGNOSIS — Z6829 Body mass index (BMI) 29.0-29.9, adult: Secondary | ICD-10-CM | POA: Insufficient documentation

## 2015-12-02 DIAGNOSIS — Z7982 Long term (current) use of aspirin: Secondary | ICD-10-CM | POA: Diagnosis not present

## 2015-12-02 DIAGNOSIS — R1031 Right lower quadrant pain: Secondary | ICD-10-CM | POA: Diagnosis not present

## 2015-12-02 HISTORY — PX: LAPAROSCOPIC APPENDECTOMY: SHX408

## 2015-12-02 LAB — COMPREHENSIVE METABOLIC PANEL
ALBUMIN: 4.1 g/dL (ref 3.5–5.0)
ALT: 14 U/L (ref 14–54)
ANION GAP: 8 (ref 5–15)
AST: 24 U/L (ref 15–41)
Alkaline Phosphatase: 62 U/L (ref 38–126)
BILIRUBIN TOTAL: 0.8 mg/dL (ref 0.3–1.2)
BUN: 13 mg/dL (ref 6–20)
CHLORIDE: 103 mmol/L (ref 101–111)
CO2: 28 mmol/L (ref 22–32)
Calcium: 9.8 mg/dL (ref 8.9–10.3)
Creatinine, Ser: 1.03 mg/dL — ABNORMAL HIGH (ref 0.44–1.00)
GFR calc Af Amer: >60 mL/min (ref >60)
GFR calc non Af Amer: 54 mL/min — ABNORMAL LOW (ref >60)
GLUCOSE: 118 mg/dL — AB (ref 65–99)
POTASSIUM: 3.9 mmol/L (ref 3.5–5.1)
SODIUM: 139 mmol/L (ref 135–145)
Total Protein: 7.7 g/dL (ref 6.5–8.1)

## 2015-12-02 LAB — URINALYSIS, ROUTINE W REFLEX MICROSCOPIC
BILIRUBIN URINE: NEGATIVE
GLUCOSE, UA: NEGATIVE mg/dL
Hgb urine dipstick: NEGATIVE
KETONES UR: NEGATIVE mg/dL
Leukocytes, UA: NEGATIVE
Nitrite: NEGATIVE
PH: 7.5 (ref 5.0–8.0)
Protein, ur: NEGATIVE mg/dL
SPECIFIC GRAVITY, URINE: 1.017 (ref 1.005–1.030)

## 2015-12-02 LAB — LIPASE, BLOOD: LIPASE: 26 U/L (ref 11–51)

## 2015-12-02 LAB — CBC
HEMATOCRIT: 42.4 % (ref 36.0–46.0)
HEMOGLOBIN: 14.4 g/dL (ref 12.0–15.0)
MCH: 32.3 pg (ref 26.0–34.0)
MCHC: 34 g/dL (ref 30.0–36.0)
MCV: 95.1 fL (ref 78.0–100.0)
Platelets: 255 10*3/uL (ref 150–400)
RBC: 4.46 MIL/uL (ref 3.87–5.11)
RDW: 12.2 % (ref 11.5–15.5)
WBC: 12 10*3/uL — ABNORMAL HIGH (ref 4.0–10.5)

## 2015-12-02 SURGERY — APPENDECTOMY, LAPAROSCOPIC
Anesthesia: General | Site: Abdomen

## 2015-12-02 MED ORDER — ENOXAPARIN SODIUM 40 MG/0.4ML ~~LOC~~ SOLN
40.0000 mg | Freq: Every day | SUBCUTANEOUS | Status: DC
  Administered 2015-12-03: 40 mg via SUBCUTANEOUS
  Filled 2015-12-02: qty 0.4

## 2015-12-02 MED ORDER — ONDANSETRON HCL 4 MG/2ML IJ SOLN
INTRAMUSCULAR | Status: DC | PRN
  Administered 2015-12-02: 4 mg via INTRAVENOUS

## 2015-12-02 MED ORDER — SUCCINYLCHOLINE CHLORIDE 200 MG/10ML IV SOSY
PREFILLED_SYRINGE | INTRAVENOUS | Status: AC
  Filled 2015-12-02: qty 10

## 2015-12-02 MED ORDER — ONDANSETRON HCL 4 MG/2ML IJ SOLN: 4.0000 mg | Freq: Four times a day (QID) | INTRAMUSCULAR | Status: DC | PRN

## 2015-12-02 MED ORDER — SUGAMMADEX SODIUM 200 MG/2ML IV SOLN
INTRAVENOUS | Status: DC | PRN
  Administered 2015-12-02: 165.2 mg via INTRAVENOUS

## 2015-12-02 MED ORDER — 0.9 % SODIUM CHLORIDE (POUR BTL) OPTIME
TOPICAL | Status: DC | PRN
  Administered 2015-12-02: 1000 mL

## 2015-12-02 MED ORDER — PHENYLEPHRINE 40 MCG/ML (10ML) SYRINGE FOR IV PUSH (FOR BLOOD PRESSURE SUPPORT)
PREFILLED_SYRINGE | INTRAVENOUS | Status: AC
  Filled 2015-12-02: qty 10

## 2015-12-02 MED ORDER — SODIUM CHLORIDE 0.9 % IV SOLN
INTRAVENOUS | Status: DC
Start: 2015-12-02 — End: 2015-12-03
  Administered 2015-12-02 – 2015-12-03 (×2): via INTRAVENOUS

## 2015-12-02 MED ORDER — IOPAMIDOL (ISOVUE-300) INJECTION 61%
INTRAVENOUS | Status: AC
  Administered 2015-12-02: 100 mL
  Filled 2015-12-02: qty 100

## 2015-12-02 MED ORDER — ACETAMINOPHEN 325 MG PO TABS: 650.0000 mg | ORAL_TABLET | Freq: Four times a day (QID) | ORAL | Status: DC | PRN

## 2015-12-02 MED ORDER — FENTANYL CITRATE (PF) 100 MCG/2ML IJ SOLN
INTRAMUSCULAR | Status: AC
  Filled 2015-12-02: qty 4

## 2015-12-02 MED ORDER — MIDAZOLAM HCL 2 MG/2ML IJ SOLN
INTRAMUSCULAR | Status: AC
  Filled 2015-12-02: qty 2

## 2015-12-02 MED ORDER — SUCCINYLCHOLINE CHLORIDE 200 MG/10ML IV SOSY
PREFILLED_SYRINGE | INTRAVENOUS | Status: AC
Start: 2015-12-02 — End: 2015-12-02
  Filled 2015-12-02: qty 10

## 2015-12-02 MED ORDER — PROPOFOL 10 MG/ML IV BOLUS
INTRAVENOUS | Status: DC | PRN
  Administered 2015-12-02: 150 mg via INTRAVENOUS

## 2015-12-02 MED ORDER — ONDANSETRON HCL 4 MG/2ML IJ SOLN
INTRAMUSCULAR | Status: AC
  Filled 2015-12-02: qty 2

## 2015-12-02 MED ORDER — ROCURONIUM BROMIDE 10 MG/ML (PF) SYRINGE
PREFILLED_SYRINGE | INTRAVENOUS | Status: AC
  Filled 2015-12-02: qty 10

## 2015-12-02 MED ORDER — ROCURONIUM BROMIDE 100 MG/10ML IV SOLN
INTRAVENOUS | Status: DC | PRN
  Administered 2015-12-02: 40 mg via INTRAVENOUS

## 2015-12-02 MED ORDER — PHENYLEPHRINE HCL 10 MG/ML IJ SOLN
INTRAMUSCULAR | Status: DC | PRN
  Administered 2015-12-02 (×2): 80 ug via INTRAVENOUS

## 2015-12-02 MED ORDER — MIDAZOLAM HCL 5 MG/5ML IJ SOLN
INTRAMUSCULAR | Status: DC | PRN
  Administered 2015-12-02: 2 mg via INTRAVENOUS

## 2015-12-02 MED ORDER — PROPOFOL 10 MG/ML IV BOLUS
INTRAVENOUS | Status: AC
  Filled 2015-12-02: qty 20

## 2015-12-02 MED ORDER — OXYCODONE HCL 5 MG PO TABS
5.0000 mg | ORAL_TABLET | Freq: Four times a day (QID) | ORAL | Status: DC | PRN
  Administered 2015-12-03 (×2): 5 mg via ORAL
  Filled 2015-12-02 (×2): qty 1

## 2015-12-02 MED ORDER — CHLORHEXIDINE GLUCONATE CLOTH 2 % EX PADS: 6.0000 | MEDICATED_PAD | Freq: Once | CUTANEOUS | Status: AC

## 2015-12-02 MED ORDER — BUPIVACAINE-EPINEPHRINE (PF) 0.25% -1:200000 IJ SOLN
INTRAMUSCULAR | Status: AC
  Filled 2015-12-02: qty 30

## 2015-12-02 MED ORDER — PHENYLEPHRINE 40 MCG/ML (10ML) SYRINGE FOR IV PUSH (FOR BLOOD PRESSURE SUPPORT)
PREFILLED_SYRINGE | INTRAVENOUS | Status: AC
Start: 2015-12-02 — End: 2015-12-02
  Filled 2015-12-02: qty 10

## 2015-12-02 MED ORDER — ENOXAPARIN SODIUM 40 MG/0.4ML ~~LOC~~ SOLN: 40.0000 mg | SUBCUTANEOUS | Status: DC

## 2015-12-02 MED ORDER — OXYCODONE HCL 5 MG PO TABS: 5.0000 mg | ORAL_TABLET | Freq: Once | ORAL | Status: DC | PRN

## 2015-12-02 MED ORDER — HYDROMORPHONE HCL 1 MG/ML IJ SOLN
0.2000 mg | INTRAMUSCULAR | Status: DC | PRN
  Administered 2015-12-02 – 2015-12-03 (×2): 0.2 mg via INTRAVENOUS
  Filled 2015-12-02 (×2): qty 1

## 2015-12-02 MED ORDER — BUPIVACAINE-EPINEPHRINE 0.25% -1:200000 IJ SOLN
INTRAMUSCULAR | Status: DC | PRN
  Administered 2015-12-02: 7 mL

## 2015-12-02 MED ORDER — PIPERACILLIN-TAZOBACTAM 3.375 G IVPB
3.3750 g | Freq: Three times a day (TID) | INTRAVENOUS | Status: DC
Start: 2015-12-02 — End: 2015-12-03
  Administered 2015-12-02: 3.375 g via INTRAVENOUS
  Administered 2015-12-02: 3.375 mg via INTRAVENOUS
  Administered 2015-12-03: 3.375 g via INTRAVENOUS
  Filled 2015-12-02 (×4): qty 50

## 2015-12-02 MED ORDER — FENTANYL CITRATE (PF) 100 MCG/2ML IJ SOLN: 25.0000 ug | INTRAMUSCULAR | Status: DC | PRN

## 2015-12-02 MED ORDER — FENTANYL CITRATE (PF) 100 MCG/2ML IJ SOLN
INTRAMUSCULAR | Status: DC | PRN
  Administered 2015-12-02 (×3): 50 ug via INTRAVENOUS

## 2015-12-02 MED ORDER — SODIUM CHLORIDE 0.9 % IV SOLN
INTRAVENOUS | Status: DC
  Administered 2015-12-02: 17:00:00 via INTRAVENOUS

## 2015-12-02 MED ORDER — OXYCODONE HCL 5 MG/5ML PO SOLN: 5.0000 mg | Freq: Once | ORAL | Status: DC | PRN

## 2015-12-02 MED ORDER — LACTATED RINGERS IV SOLN
INTRAVENOUS | Status: DC | PRN
Start: 2015-12-02 — End: 2015-12-02
  Administered 2015-12-02 (×2): via INTRAVENOUS

## 2015-12-02 MED ORDER — LIDOCAINE HCL (CARDIAC) 20 MG/ML IV SOLN
INTRAVENOUS | Status: DC | PRN
Start: 2015-12-02 — End: 2015-12-02
  Administered 2015-12-02: 60 mg via INTRAVENOUS

## 2015-12-02 MED ORDER — SODIUM CHLORIDE 0.9 % IR SOLN
Status: DC | PRN
  Administered 2015-12-02: 1

## 2015-12-02 MED ORDER — DOCUSATE SODIUM 100 MG PO CAPS
100.0000 mg | ORAL_CAPSULE | Freq: Two times a day (BID) | ORAL | Status: DC
  Administered 2015-12-03: 100 mg via ORAL
  Filled 2015-12-02: qty 1

## 2015-12-02 SURGICAL SUPPLY — 55 items
ADH SKN CLS APL DERMABOND .7 (GAUZE/BANDAGES/DRESSINGS) ×1
APPLIER CLIP ROT 10 11.4 M/L (STAPLE)
APR CLP MED LRG 11.4X10 (STAPLE)
BAG SPEC RTRVL LRG 6X4 10 (ENDOMECHANICALS) ×1
BLADE SURG ROTATE 9660 (MISCELLANEOUS) IMPLANT
CANISTER SUCTION 2500CC (MISCELLANEOUS) ×2 IMPLANT
CHLORAPREP W/TINT 26ML (MISCELLANEOUS) ×2 IMPLANT
CLIP APPLIE ROT 10 11.4 M/L (STAPLE) IMPLANT
COVER SURGICAL LIGHT HANDLE (MISCELLANEOUS) ×2 IMPLANT
CUTTER FLEX LINEAR 45M (STAPLE) ×2 IMPLANT
DERMABOND ADVANCED (GAUZE/BANDAGES/DRESSINGS) ×1
DERMABOND ADVANCED .7 DNX12 (GAUZE/BANDAGES/DRESSINGS) IMPLANT
DEVICE TROCAR PUNCTURE CLOSURE (ENDOMECHANICALS) ×1 IMPLANT
DRSG TEGADERM 4X4.75 (GAUZE/BANDAGES/DRESSINGS) ×2 IMPLANT
ELECT REM PT RETURN 9FT ADLT (ELECTROSURGICAL) ×2
ELECTRODE REM PT RTRN 9FT ADLT (ELECTROSURGICAL) ×1 IMPLANT
ENDOLOOP SUT PDS II  0 18 (SUTURE)
ENDOLOOP SUT PDS II 0 18 (SUTURE) IMPLANT
GAUZE SPONGE 2X2 8PLY STRL LF (GAUZE/BANDAGES/DRESSINGS) ×1 IMPLANT
GLOVE BIO SURGEON STRL SZ 6.5 (GLOVE) ×1 IMPLANT
GLOVE BIOGEL M STER SZ 6 (GLOVE) ×1 IMPLANT
GLOVE BIOGEL PI IND STRL 7.0 (GLOVE) IMPLANT
GLOVE BIOGEL PI IND STRL 8 (GLOVE) ×1 IMPLANT
GLOVE BIOGEL PI INDICATOR 7.0 (GLOVE) ×2
GLOVE BIOGEL PI INDICATOR 8 (GLOVE) ×1
GLOVE ECLIPSE 8.0 STRL XLNG CF (GLOVE) ×2 IMPLANT
GOWN STRL REUS W/ TWL LRG LVL3 (GOWN DISPOSABLE) ×2 IMPLANT
GOWN STRL REUS W/ TWL XL LVL3 (GOWN DISPOSABLE) ×1 IMPLANT
GOWN STRL REUS W/TWL LRG LVL3 (GOWN DISPOSABLE) ×4
GOWN STRL REUS W/TWL XL LVL3 (GOWN DISPOSABLE) ×2
KIT BASIN OR (CUSTOM PROCEDURE TRAY) ×2 IMPLANT
KIT ROOM TURNOVER OR (KITS) ×2 IMPLANT
NS IRRIG 1000ML POUR BTL (IV SOLUTION) ×2 IMPLANT
PAD ARMBOARD 7.5X6 YLW CONV (MISCELLANEOUS) ×4 IMPLANT
POUCH SPECIMEN RETRIEVAL 10MM (ENDOMECHANICALS) ×2 IMPLANT
RELOAD 45 VASCULAR/THIN (ENDOMECHANICALS) ×2 IMPLANT
RELOAD STAPLE 45 2.5 WHT GRN (ENDOMECHANICALS) IMPLANT
RELOAD STAPLE 45 3.5 BLU ETS (ENDOMECHANICALS) ×1 IMPLANT
RELOAD STAPLE TA45 3.5 REG BLU (ENDOMECHANICALS) ×2 IMPLANT
SCALPEL HARMONIC ACE (MISCELLANEOUS) ×2 IMPLANT
SCISSORS LAP 5X35 DISP (ENDOMECHANICALS) IMPLANT
SET IRRIG TUBING LAPAROSCOPIC (IRRIGATION / IRRIGATOR) ×2 IMPLANT
SLEEVE ENDOPATH XCEL 5M (ENDOMECHANICALS) ×2 IMPLANT
SPECIMEN JAR SMALL (MISCELLANEOUS) ×2 IMPLANT
SPONGE GAUZE 2X2 STER 10/PKG (GAUZE/BANDAGES/DRESSINGS) ×1
SUT MNCRL AB 4-0 PS2 18 (SUTURE) ×2 IMPLANT
SUT VICRYL 0 TIES 12 18 (SUTURE) IMPLANT
SUT VICRYL 0 UR6 27IN ABS (SUTURE) IMPLANT
TOWEL OR 17X24 6PK STRL BLUE (TOWEL DISPOSABLE) ×2 IMPLANT
TOWEL OR 17X26 10 PK STRL BLUE (TOWEL DISPOSABLE) ×2 IMPLANT
TRAY FOLEY CATH 16FR SILVER (SET/KITS/TRAYS/PACK) ×2 IMPLANT
TRAY LAPAROSCOPIC MC (CUSTOM PROCEDURE TRAY) ×2 IMPLANT
TROCAR XCEL 12X100 BLDLESS (ENDOMECHANICALS) ×2 IMPLANT
TROCAR XCEL NON-BLD 5MMX100MML (ENDOMECHANICALS) ×3 IMPLANT
TUBING INSUFFLATION (TUBING) ×2 IMPLANT

## 2015-12-02 NOTE — ED Notes (Signed)
Assessment done when patient came to the room but computer in patient room not working

## 2015-12-02 NOTE — Anesthesia Procedure Notes (Signed)
Procedure Name: Intubation Date/Time: 12/02/2015 9:48 PM Performed by: Manus Gunning, Emmylou Bieker J Pre-anesthesia Checklist: Patient identified, Emergency Drugs available, Suction available, Patient being monitored and Timeout performed Patient Re-evaluated:Patient Re-evaluated prior to inductionOxygen Delivery Method: Circle system utilized Preoxygenation: Pre-oxygenation with 100% oxygen Intubation Type: IV induction Ventilation: Mask ventilation without difficulty Laryngoscope Size: Mac and 3 Grade View: Grade I Tube size: 7.5 mm Number of attempts: 1 Placement Confirmation: ETT inserted through vocal cords under direct vision,  positive ETCO2 and breath sounds checked- equal and bilateral Secured at: 21 cm Tube secured with: Tape Dental Injury: Teeth and Oropharynx as per pre-operative assessment

## 2015-12-02 NOTE — ED Notes (Signed)
OR called, ready for pt, pt asked to use the restroom, transferred to OR

## 2015-12-02 NOTE — ED Triage Notes (Signed)
Pt sts RLQ pain starting yesterday with nausea

## 2015-12-02 NOTE — ED Notes (Signed)
Pt reports last fluids intake was 45cc at 8:30 this am w/ her medications, no food since yesterday.

## 2015-12-02 NOTE — Anesthesia Preprocedure Evaluation (Signed)
Anesthesia Evaluation  Patient identified by MRN, date of birth, ID band Patient awake    Reviewed: Allergy & Precautions, H&P , NPO status , Patient's Chart, lab work & pertinent test results  Airway Mallampati: II   Neck ROM: full    Dental   Pulmonary neg pulmonary ROS,    breath sounds clear to auscultation       Cardiovascular hypertension, + CAD   Rhythm:regular Rate:Normal     Neuro/Psych  Neuromuscular disease    GI/Hepatic hiatal hernia,   Endo/Other    Renal/GU      Musculoskeletal   Abdominal   Peds  Hematology   Anesthesia Other Findings   Reproductive/Obstetrics                             Anesthesia Physical Anesthesia Plan  ASA: II and emergent  Anesthesia Plan: General   Post-op Pain Management:    Induction: Intravenous  Airway Management Planned: Oral ETT  Additional Equipment:   Intra-op Plan:   Post-operative Plan: Extubation in OR  Informed Consent: I have reviewed the patients History and Physical, chart, labs and discussed the procedure including the risks, benefits and alternatives for the proposed anesthesia with the patient or authorized representative who has indicated his/her understanding and acceptance.     Plan Discussed with: CRNA, Anesthesiologist and Surgeon  Anesthesia Plan Comments:         Anesthesia Quick Evaluation

## 2015-12-02 NOTE — ED Provider Notes (Signed)
[facility] DEPT Provider Note   CSN: [id number] Arrival date & time: 12/02/15  1146     History   Chief Complaint Chief Complaint  Patient presents with  . Abdominal Pain    HPI Dawn Herring is a 70 y.o. female.  She presents for evaluation of right-sided abdominal pain radiating to right back, and right groin, present for 1 day, constant and intermittently worse in a colicky fashion. She was able to have a bowel movement 3 today, which she describes as being normal, soft. She denies nausea, vomiting, fever, chills, cough, shortness of breath or chest pain. No history of kidney stones. No prior similar pain. No trauma. There are no other known modifying factors.  HPI  Past Medical History:  Diagnosis Date  . Chest pain    a. Neg stress tests - 2005, 2008, 2009;  b. 08/2014 Cardiac CT: Ca score 193 (80th%'ile).  . Essential hypertension   . Hiatal hernia    a. 08/2014 CT chest: large hiatal hernia.  Marland Kitchen History of DVT (deep vein thrombosis)   . Hyperlipidemia    a. statin intolerant;  b. on zetia and repatha.  . Palpitations    a. 02/2015 Event Monitor: NSR w/ rare, short episodes of A Tach, rare short runs of SVT < 15 beats.    Patient Active Problem List   Diagnosis Date Noted  . Arrhythmia   . Palpitations   . Essential hypertension   . Right foot pain 03/17/2015  . Non-alcoholic fatty liver disease 03/03/2015  . Gallbladder attack 01/28/2015  . Thyroid nodule 01/26/2015  . CAD (coronary artery disease) 01/26/2015  . Tachycardia 07/30/2014  . Familial hypercholesterolemia 10/30/2013  . Foot swelling 10/30/2012  . Rash 10/30/2012  . Obesity 09/05/2010  . HTN (hypertension) 09/05/2010  . Abnormal EKG 09/03/2010  . Hyperlipidemia 09/03/2010  . HYPERLIPIDEMIA 04/14/2010  . OTHER SPECIFIED PRURITIC CONDITIONS 04/14/2010    Past Surgical History:  Procedure Laterality Date  . ROOT CANAL  2013  . ROOT CANAL    . VAGINAL HYSTERECTOMY     abdominal  hysterectomy    OB History    No data available       Home Medications    Prior to Admission medications   Medication Sig Start Date End Date Taking? Authorizing Provider  aspirin 81 MG tablet Take 162 mg by mouth daily.   Yes Historical Provider, MD  colchicine 0.6 MG tablet Take 1 tablet (0.6 mg total) by mouth daily. As needed for gout flare 11/06/12  Yes Minna Merritts, MD  Evolocumab (REPATHA SURECLICK) XX123456 MG/ML SOAJ Inject 1 pen into the skin every 14 (fourteen) days. 11/17/15  Yes Minna Merritts, MD  ezetimibe (ZETIA) 10 MG tablet TAKE 1 TABLET BY MOUTH EVERY DAY 05/27/15  Yes Minna Merritts, MD  omeprazole (PRILOSEC) 20 MG capsule Take 20 mg by mouth daily.     Yes Historical Provider, MD  valsartan-hydrochlorothiazide (DIOVAN-HCT) 160-25 MG tablet TAKE 1 TABLET BY MOUTH EVERY DAY 03/31/15  Yes Minna Merritts, MD    Family History Family History  Problem Relation Age of Onset  . Heart disease Mother   . Heart disease Father   . Colon cancer      Grandparent  . Thyroid cancer Brother     Social History Social History  Substance Use Topics  . Smoking status: Never Smoker  . Smokeless tobacco: Not on file  . Alcohol use No     Allergies  Statins   Review of Systems Review of Systems  All other systems reviewed and are negative.    Physical Exam Updated Vital Signs BP 165/75 (BP Location: Right Arm)   Pulse 78   Temp 97.9 F (36.6 C) (Oral)   Resp 20   Ht 5\' 6"  (1.676 m)   Wt 182 lb (82.6 kg)   SpO2 97%   BMI 29.38 kg/m   Physical Exam  Constitutional: She is oriented to person, place, and time. She appears well-developed and well-nourished.  HENT:  Head: Normocephalic and atraumatic.  Eyes: Conjunctivae and EOM are normal. Pupils are equal, round, and reactive to light.  Neck: Normal range of motion and phonation normal. Neck supple.  Cardiovascular: Normal rate and regular rhythm.   Pulmonary/Chest: Effort normal and breath sounds  normal. She exhibits no tenderness.  Abdominal: Soft. Bowel sounds are normal. She exhibits no distension and no mass. There is tenderness (Right upper lower quadrants, moderate.). There is no rebound and no guarding.  Genitourinary:  Genitourinary Comments: No costovertebral angle tenderness with percussion  Musculoskeletal: Normal range of motion.  Neurological: She is alert and oriented to person, place, and time. She exhibits normal muscle tone.  Skin: Skin is warm and dry.  Psychiatric: She has a normal mood and affect. Her behavior is normal. Judgment and thought content normal.  Nursing note and vitals reviewed.    ED Treatments / Results  Labs (all labs ordered are listed, but only abnormal results are displayed) Labs Reviewed  COMPREHENSIVE METABOLIC PANEL - Abnormal; Notable for the following:       Result Value   Glucose, Bld 118 (*)    Creatinine, Ser 1.03 (*)    GFR calc non Af Amer 54 (*)    All other components within normal limits  CBC - Abnormal; Notable for the following:    WBC 12.0 (*)    All other components within normal limits  LIPASE, BLOOD  URINALYSIS, ROUTINE W REFLEX MICROSCOPIC (NOT AT [facility])    EKG  EKG Interpretation None       Radiology No results found.  Procedures Procedures (including critical care time)  Medications Ordered in ED Medications  0.9 %  sodium chloride infusion ( Intravenous New Bag/Given 12/02/15 1701)     Initial Impression / Assessment and Plan / ED Course  I have reviewed the triage vital signs and the nursing notes.  Pertinent labs & imaging results that were available during my care of the patient were reviewed by me and considered in my medical decision making (see chart for details).  Clinical Course  Value Comment By Time  CT Abdomen Pelvis W Contrast C/W acute appendicitis Daleen Bo, MD 09/06 1906    Medications  0.9 %  sodium chloride infusion ( Intravenous Transfusing/Transfer 12/02/15 2103)    piperacillin-tazobactam (ZOSYN) IVPB 3.375 g ( Intravenous Automatically Held 12/10/15 2200)  docusate sodium (COLACE) capsule 100 mg ( Oral Automatically Held 12/11/15 2200)  HYDROmorphone (DILAUDID) injection 0.2 mg ( Intravenous MAR Hold 12/02/15 2140)  enoxaparin (LOVENOX) injection 40 mg ( Subcutaneous Automatically Held 12/11/15 0000)  oxyCODONE (Oxy IR/ROXICODONE) immediate release tablet 5 mg (not administered)    Or  oxyCODONE (ROXICODONE) 5 MG/5ML solution 5 mg (not administered)  ondansetron (ZOFRAN) injection 4 mg (not administered)  fentaNYL (SUBLIMAZE) injection 25-50 mcg (not administered)  acetaminophen (TYLENOL) tablet 650 mg (not administered)  oxyCODONE (Oxy IR/ROXICODONE) immediate release tablet 5 mg (not administered)  iopamidol (ISOVUE-300) 61 % injection (100 mLs  Contrast Given 12/02/15 1836)    Patient Vitals for the past 24 hrs:  BP Temp Temp src Pulse Resp SpO2 Height Weight  12/02/15 2315 130/62 97.5 F (36.4 C) - 65 17 - - -  12/02/15 2300 137/66 - - 74 (!) 22 92 % - -  12/02/15 2252 127/62 97.2 F (36.2 C) - 80 13 96 % - -  12/02/15 2100 145/94 - - 63 - 99 % - -  12/02/15 2015 138/64 - - 64 - 96 % - -  12/02/15 1945 136/75 - - 61 - 98 % - -  12/02/15 1930 162/59 - - 66 - 99 % - -  12/02/15 1915 159/64 - - 60 - 98 % - -  12/02/15 1815 145/62 - - 60 - 98 % - -  12/02/15 1800 (!) 148/53 - - 60 - 95 % - -  12/02/15 1715 137/70 - - 61 - 95 % - -  12/02/15 1700 127/67 - - 63 - 98 % - -  12/02/15 1609 165/75 - - 78 20 97 % - -  12/02/15 1417 121/69 - - 82 18 99 % - -  12/02/15 1151 - - - - - - 5\' 6"  (1.676 m) 182 lb (82.6 kg)  12/02/15 1150 158/90 97.9 F (36.6 C) Oral 87 18 98 % - -   Case discussed with on-call surgeon, to arrange admission.  At disposition- Reevaluation with update and discussion. After initial assessment and treatment, an updated evaluation reveals she remains comfortable. Findings discussed with patient and all questions answered.  Logen Fowle L    Final Clinical Impressions(s) / ED Diagnoses   Final diagnoses:  Acute appendicitis, unspecified acute appendicitis type    Acute appendicitis without complicating factors. She will require appendectomy  Nursing Notes Reviewed/ Care Coordinated, and agree without changes. Applicable Imaging Reviewed.  Interpretation of Laboratory Data incorporated into ED treatment  Plan- admit  New Prescriptions New Prescriptions   No medications on file     Daleen Bo, MD 12/02/15 2327

## 2015-12-02 NOTE — Transfer of Care (Signed)
Immediate Anesthesia Transfer of Care Note  Patient: Dawn Herring  Procedure(s) Performed: Procedure(s): APPENDECTOMY LAPAROSCOPIC (N/A)  Patient Location: PACU  Anesthesia Type:General  Level of Consciousness: awake, alert  and oriented  Airway & Oxygen Therapy: Patient Spontanous Breathing  Post-op Assessment: Report given to RN and Post -op Vital signs reviewed and stable  Post vital signs: Reviewed and stable  Last Vitals:  Vitals:   12/02/15 2100 12/02/15 2252  BP: 145/94   Pulse: 63 80  Resp:  13  Temp:  36.2 C    Last Pain:  Vitals:   12/02/15 1151  TempSrc:   PainSc: 8          Complications: No apparent anesthesia complications

## 2015-12-02 NOTE — Op Note (Addendum)
Dawn Herring 70 y.o. female [id number]   12/02/2015  Preoperative diagnosis: acute appendicitis   Postoperative diagnosis: Same   Procedure: laparoscopic appendectomy  Surgeon: Clovis Riley M.D.  Asst: Dr. Neysa Bonito  Anesthesia: Gen.   Indications for procedure: Dawn Herring is a 70 y.o. female with symptoms of pain in right lower quadrant and nausea consistent with acute appendicitis. Confirmed by CT scan and laboratory values.  Description of procedure: The patient was brought into the operative suite, placed supine. Anesthesia was administered with endotracheal tube. A foley catheter was placed to be removed after the case. The patient's left arm was tucked. All pressure points were offloaded by foam padding. The patient was prepped and draped in the usual sterile fashion.  A transverse incision was made to the inferior of the umbilicus and a 51mm optical entry trocar was used to gain access to that abdominal cavity.  Pneumoperitoneum was applied with high flow low pressure.  1 60mm trocar were placed in the suprapubic space and 1 28mm trocar in the LLQ.Marland Kitchen All trocars sites were first anesthesized with 0.25% marcaine with epinephrine. Next the patient was placed in trendelenberg, rotated to the left. The omentum was retracted cephalad. The cecum and appendix were identified. The base of the appendix was dissected and a window through the mesoappendix was created with blunt dissection and use of the harmonic scalpel. A 3mm blue load linear cutting stapler was used to transect the appendix at its juncture with the cecum. The mesentery was divided with a white load linear cutting stapler.   The appendix was placed in a specimen bag and removed through the left lower quadrant port site. The pelvis and RLQ were irrigated and the effluent was clear. Hemostasis was confirmed.   0 vicryl was used to close the fascial defect in the left lower quadrant where the 60mm trocar had been.  Pneumoperitoneum was removed, all trocars were removed. All incisions were closed with 4-0 monocryl subcuticular stitch. The patient woke from anesthesia and was brought to PACU in stable condition.  Findings: Acute appendicitis  Specimen: appendix  Blood loss: 5 ml  Local anesthesia: 10 ml 0.25% marcaine with epinephrine  Complications: none  Romana Juniper, M.D. General, Bariatric, & Minimally Invasive Surgery [facility] [place]

## 2015-12-02 NOTE — ED Notes (Signed)
Placed patient into a gown and on the monitor with vital signs

## 2015-12-02 NOTE — H&P (Signed)
Surgical Consultation/ History and Physical  CC: Abdominal pain  HPI: Dawn Herring began to experience vague, central abdominal pain approximately 36h ago. It then migrated to the right lower quadrant and is accompanied by nausea. She has not noted any fevers or emesis, denies change in bowel function.  She presented to the emergency department today and is found to have a mild leukocytosis to 12 and a CT scan consistent with nonperforated acute appendicitis.   Allergies  Allergen Reactions  . Statins     REACTION: Has terrible reactions; abdominal pain    Past Medical History:  Diagnosis Date  . Chest pain    a. Neg stress tests - 2005, 2008, 2009;  b. 08/2014 Cardiac CT: Ca score 193 (80th%'ile).  . Essential hypertension   . Hiatal hernia    a. 08/2014 CT chest: large hiatal hernia.  Marland Kitchen History of DVT (deep vein thrombosis)   . Hyperlipidemia    a. statin intolerant;  b. on zetia and repatha.  . Palpitations    a. 02/2015 Event Monitor: NSR w/ rare, short episodes of A Tach, rare short runs of SVT < 15 beats.    Past Surgical History:  Procedure Laterality Date  . ROOT CANAL  2013  . ROOT CANAL    . VAGINAL HYSTERECTOMY     abdominal hysterectomy  She had a diagnostic laparoscopy in her early 72s, and an abdominal hysterectomy via low transverse incision in 1986.   Family History  Problem Relation Age of Onset  . Heart disease Mother   . Heart disease Father   . Colon cancer      Grandparent  . Thyroid cancer Brother     Social History   Social History  . Marital status: Married    Spouse name: N/A  . Number of children: N/A  . Years of education: N/A   Social History Main Topics  . Smoking status: Never Smoker  . Smokeless tobacco: None  . Alcohol use No  . Drug use: No  . Sexual activity: Not Asked   Other Topics Concern  . None   Social History Narrative  . None  She lives at home with her husband. 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 She  cares for her 59yo father who has severe dementia.   No current facility-administered medications on file prior to encounter.    Current Outpatient Prescriptions on File Prior to Encounter  Medication Sig Dispense Refill  . aspirin 81 MG tablet Take 162 mg by mouth daily.    . colchicine 0.6 MG tablet Take 1 tablet (0.6 mg total) by mouth daily. As needed for gout flare 30 tablet 0  . Evolocumab (REPATHA SURECLICK) XX123456 MG/ML SOAJ Inject 1 pen into the skin every 14 (fourteen) days. 2 pen 11  . ezetimibe (ZETIA) 10 MG tablet TAKE 1 TABLET BY MOUTH EVERY DAY 90 tablet 3  . omeprazole (PRILOSEC) 20 MG capsule Take 20 mg by mouth daily.      . valsartan-hydrochlorothiazide (DIOVAN-HCT) 160-25 MG tablet TAKE 1 TABLET BY MOUTH EVERY DAY 90 tablet 3    Review of Systems: a complete, 10pt review of systems was completed with pertinent positives and negatives as documented in the HPI.   Physical Exam: Vitals:   12/02/15 1417 12/02/15 1609  BP: 121/69 165/75  Pulse: 82 78  Resp: 18 20  Temp:     Gen: A&Ox3, no distress  H&N: normocephalic, atraumatic, EOMI. Neck supple, no masses or thyromegaly  Chest: unlabored respirations; RRR with palpable distal pulses Abdomen: Soft, nondistended, focally tender with voluntary guarding in the right lower quadrant. No hernia or mass. Well healed umbilical incision and low transverse incision.  Extremities: warm, without edema, no deformities Neuro: grossly intact Psych: appropriate mood and affect  Imaging: Study Result   CLINICAL DATA:  Right-sided flank pain for 2 days, initial encounter  EXAM: CT ABDOMEN AND PELVIS WITH CONTRAST  TECHNIQUE: Multidetector CT imaging of the abdomen and pelvis was performed using the standard protocol following bolus administration of intravenous contrast.  CONTRAST:  100 mL ISOVUE-300 IOPAMIDOL (ISOVUE-300) INJECTION 61%  COMPARISON:  None.  FINDINGS: Lower chest: Lung bases are free of acute  infiltrate or sizable effusion. A large hiatal hernia is noted.  Hepatobiliary: Fatty infiltration of the liver is seen. The gallbladder is within normal limits.  Pancreas: No mass, inflammatory changes, or other significant abnormality.  Spleen: Within normal limits in size and appearance.  Adrenals/Urinary Tract: No masses identified. No evidence of hydronephrosis.  Stomach/Bowel: The appendix is dilated to 17 mm with periappendiceal inflammatory changes consistent with acute appendicitis. Few scattered lymph nodes are noted likely reactive in nature. Diverticulosis without evidence of diverticulitis is noted within the sigmoid colon.  Vascular/Lymphatic: Aortoiliac calcifications without aneurysmal dilatation are noted. No other lymphadenopathy is seen.  Reproductive: The uterus has been surgically removed.  Other: No free pelvic fluid is noted.  Musculoskeletal:  No suspicious bone lesions identified.  IMPRESSION: Changes consistent with acute appendicitis.  Diverticular change without diverticulitis is noted.  Hiatal hernia.   Electronically Signed   By: Inez Catalina M.D.   On: 12/02/2015 18:58     A/P: 70yo woman with acute appendicitis confirmed by Ct. Discussed laparoscopic appendectomy risks, benefits and alternatives including risk of bleeding, infection, pain, scarring, injury to intraabdominal structures, possibility of delayed abscess. She is agreeable to proceed with surgery.  -Admit to obs -NPO, IVF -Zosyn -OR this evening -Likely home tomorrow.   Romana Juniper, MD [facility], [place] Pager [phone]

## 2015-12-02 NOTE — ED Notes (Signed)
MD at bedside. 

## 2015-12-03 ENCOUNTER — Encounter (HOSPITAL_COMMUNITY): Payer: Self-pay

## 2015-12-03 DIAGNOSIS — K352 Acute appendicitis with generalized peritonitis: Secondary | ICD-10-CM | POA: Diagnosis not present

## 2015-12-03 DIAGNOSIS — K353 Acute appendicitis with localized peritonitis: Secondary | ICD-10-CM | POA: Diagnosis not present

## 2015-12-03 MED ORDER — OXYCODONE HCL 5 MG PO TABS: 5.0000 mg | ORAL_TABLET | ORAL | 0 refills | Status: DC | PRN

## 2015-12-03 NOTE — Care Management Obs Status (Signed)
[facility] NOTIFICATION   Patient Details  Name: Dawn Herring MRN: [id number] Date of Birth: 1946-03-24   Medicare Observation Status Notification Given:  Yes (Medicare procedure)    Marilu Favre, RN 12/03/2015, 8:40 AM

## 2015-12-03 NOTE — Discharge Summary (Signed)
[facility] Discharge Summary   Patient ID: Dawn Herring MRN: [id number] DOB/AGE: 08-03-1945 70 y.o.  Admit date: 12/02/2015 Discharge date: 12/03/2015  Admitting Diagnosis: Acute appendicitis   Discharge Diagnosis Patient Active Problem List   Diagnosis Date Noted  . Acute appendicitis 12/02/2015  . Arrhythmia   . Palpitations   . Essential hypertension   . Right foot pain 03/17/2015  . Non-alcoholic fatty liver disease 03/03/2015  . Gallbladder attack 01/28/2015  . Thyroid nodule 01/26/2015  . CAD (coronary artery disease) 01/26/2015  . Tachycardia 07/30/2014  . Familial hypercholesterolemia 10/30/2013  . Foot swelling 10/30/2012  . Rash 10/30/2012  . Obesity 09/05/2010  . HTN (hypertension) 09/05/2010  . Abnormal EKG 09/03/2010  . Hyperlipidemia 09/03/2010  . HYPERLIPIDEMIA 04/14/2010  . OTHER SPECIFIED PRURITIC CONDITIONS 04/14/2010    Consultants None  Imaging: Ct Abdomen Pelvis W Contrast  Result Date: 12/02/2015 CLINICAL DATA:  Right-sided flank pain for 2 days, initial encounter EXAM: CT ABDOMEN AND PELVIS WITH CONTRAST TECHNIQUE: Multidetector CT imaging of the abdomen and pelvis was performed using the standard protocol following bolus administration of intravenous contrast. CONTRAST:  100 mL ISOVUE-300 IOPAMIDOL (ISOVUE-300) INJECTION 61% COMPARISON:  None. FINDINGS: Lower chest: Lung bases are free of acute infiltrate or sizable effusion. A large hiatal hernia is noted. Hepatobiliary: Fatty infiltration of the liver is seen. The gallbladder is within normal limits. Pancreas: No mass, inflammatory changes, or other significant abnormality. Spleen: Within normal limits in size and appearance. Adrenals/Urinary Tract: No masses identified. No evidence of hydronephrosis. Stomach/Bowel: The appendix is dilated to 17 mm with periappendiceal inflammatory changes consistent with acute appendicitis. Few scattered lymph nodes are noted likely reactive in nature.  Diverticulosis without evidence of diverticulitis is noted within the sigmoid colon. Vascular/Lymphatic: Aortoiliac calcifications without aneurysmal dilatation are noted. No other lymphadenopathy is seen. Reproductive: The uterus has been surgically removed. Other: No free pelvic fluid is noted. Musculoskeletal:  No suspicious bone lesions identified. IMPRESSION: Changes consistent with acute appendicitis. Diverticular change without diverticulitis is noted. Hiatal hernia. Electronically Signed   By: Inez Catalina M.D.   On: 12/02/2015 18:58    Procedures Dr. Clovis Riley (12/02/15) - Laparoscopic Appendectomy  Hospital Course:  70 y/o female who presented to [facility] with vague, central abdominal paid that migrated to her right lower quadrant.  CT significant for above findings of acute appendicitis without perforation  Patient was admitted and underwent procedure listed above.  Tolerated procedure well and was transferred to the floor.  Diet was advanced as tolerated.  On POD#1, the patient was voiding well, tolerating diet, ambulating well, pain well controlled, vital signs stable, incisions c/d/i and felt stable for discharge home.  Patient will follow up in our office in 2 weeks and knows to call with questions or concerns.   Physical Exam: General:  Alert, NAD, pleasant, comfortable Abd:  Soft, ND, mild tenderness, incisions C/D/I, +BS    Medication List    TAKE these medications   aspirin 81 MG tablet Take 162 mg by mouth daily.   colchicine 0.6 MG tablet Take 1 tablet (0.6 mg total) by mouth daily. As needed for gout flare   Evolocumab 140 MG/ML Soaj Commonly known as:  REPATHA SURECLICK Inject 1 pen into the skin every 14 (fourteen) days.   ezetimibe 10 MG tablet Commonly known as:  ZETIA TAKE 1 TABLET BY MOUTH EVERY DAY   omeprazole 20 MG capsule Commonly known as:  PRILOSEC Take 20 mg by mouth daily.  oxyCODONE 5 MG immediate release tablet Commonly known as:  Oxy  IR/ROXICODONE Take 1 tablet (5 mg total) by mouth every 4 (four) hours as needed for severe pain.   valsartan-hydrochlorothiazide 160-25 MG tablet Commonly known as:  DIOVAN-HCT TAKE 1 TABLET BY MOUTH EVERY DAY        Follow-up [facility], [place]. Go on 12/16/2015.   Specialty:  General Surgery Why:  your appointment for post-operative follow-up is at 11:30 AM. please arrive 30 minutes early to get checked in and fill out any necessary paperwork. Contact information: [address] [place] [phone]          Signed: Obie Dredge, [facility] 12/03/2015, 11:17 AM Pager: [phone] Consults: [phone] Mon-Fri 7:00 am-4:30 pm Sat-Sun 7:00 am-11:30 am

## 2015-12-03 NOTE — Discharge Instructions (Signed)
Your appointment is at 11:30 AM on 12/16/15, please arrive at least 30 min before your appointment to complete your check in paperwork.  If you are unable to arrive 30 min prior to your appointment time we may have to cancel or reschedule you.  Do not lift over 10-15 pounds for 2 weeks.   LAPAROSCOPIC SURGERY: POST OP INSTRUCTIONS  1. DIET: Follow a light bland diet the first 24 hours after arrival home, such as soup, liquids, crackers, etc. Be sure to include lots of fluids daily. Avoid fast food or heavy meals as your are more likely to get nauseated. Eat a low fat the next few days after surgery.  2. Take your usually prescribed home medications unless otherwise directed. 3. PAIN CONTROL:  1. Pain is best controlled by a usual combination of three different methods TOGETHER:  1. Ice/Heat 2. Over the counter pain medication 3. Prescription pain medication 2. Most patients will experience some swelling and bruising around the incisions. Ice packs or heating pads (30-60 minutes up to 6 times a day) will help. Use ice for the first few days to help decrease swelling and bruising, then switch to heat to help relax tight/sore spots and speed recovery. Some people prefer to use ice alone, heat alone, alternating between ice & heat. Experiment to what works for you. Swelling and bruising can take several weeks to resolve.  3. It is helpful to take an over-the-counter pain medication regularly for the first few weeks. Choose one of the following that works best for you:  1. Naproxen (Aleve, etc) Two 220mg  tabs twice a day 2. Ibuprofen (Advil, etc) Three 200mg  tabs four times a day (every meal & bedtime) 3. Acetaminophen (Tylenol, etc) 500-650mg  four times a day (every meal & bedtime) 4. A prescription for pain medication (such as oxycodone, hydrocodone, etc) should be given to you upon discharge. Take your pain medication as prescribed.  1. If you are having problems/concerns with the prescription  medicine (does not control pain, nausea, vomiting, rash, itching, etc), please call us [phone] to see if we need to switch you to a different pain medicine that will work better for you and/or control your side effect better. 2. If you need a refill on your pain medication, please contact your pharmacy. They will contact our office to request authorization. Prescriptions will not be filled after 5 pm or on week-ends. 4. Avoid getting constipated. Between the surgery and the pain medications, it is common to experience some constipation. Increasing fluid intake and taking a fiber supplement (such as Metamucil, Citrucel, FiberCon, MiraLax, etc) 1-2 times a day regularly will usually help prevent this problem from occurring. A mild laxative (prune juice, Milk of Magnesia, MiraLax, etc) should be taken according to package directions if there are no bowel movements after 48 hours.  5. Watch out for diarrhea. If you have many loose bowel movements, simplify your diet to bland foods & liquids for a few days. Stop any stool softeners and decrease your fiber supplement. Switching to mild anti-diarrheal medications (Kayopectate, Pepto Bismol) can help. If this worsens or does not improve, please call us. 6. Wash / shower every day. You may shower over the dressings as they are waterproof. Continue to shower over incision(s) after the dressing is off. DO NOT SOAK in bathtub, pool, hot tub, etc for 2-3 weeks. 7. Remove your waterproof bandages 5 days after surgery. You may leave the incision open to air. You may replace a dressing/Band-Aid to cover  the incision for comfort if you wish.  8. ACTIVITIES as tolerated:  1. You may resume regular (light) daily activities beginning the next day--such as daily self-care, walking, climbing stairs--gradually increasing activities as tolerated. If you can walk 30 minutes without difficulty, it is safe to try more intense activity such as jogging, treadmill, bicycling,  low-impact aerobics, swimming, etc. 2. Save the most intensive and strenuous activity for last such as sit-ups, heavy lifting, contact sports, etc Refrain from any heavy lifting or straining until you are off narcotics for pain control.  3. DO NOT PUSH THROUGH PAIN. Let pain be your guide: If it hurts to do something, don't do it. Pain is your body warning you to avoid that activity for another week until the pain goes down. 4. You may drive when you are no longer taking prescription pain medication, you can comfortably wear a seatbelt, and you can safely maneuver your car and apply brakes. 5. You may have sexual intercourse when it is comfortable.  9. FOLLOW UP in our office  1. Please call [facility] at [phone] to set up an appointment to see your surgeon in the office for a follow-up appointment approximately 2-3 weeks after your surgery. 2. Make sure that you call for this appointment the day you arrive home to insure a convenient appointment time.      10. IF YOU HAVE DISABILITY OR FAMILY LEAVE FORMS, BRING THEM TO THE               OFFICE FOR PROCESSING.   WHEN TO CALL us [phone]:  1. Poor pain control 2. Reactions / problems with new medications (rash/itching, nausea, etc)  3. Fever over 101.5 F (38.5 C) 4. Inability to urinate 5. Nausea and/or vomiting 6. Worsening swelling or bruising 7. Continued bleeding from incision. 8. Increased pain, redness, or drainage from the incision  The clinic staff is available to answer your questions during regular business hours (8:30am-5pm). Please dont hesitate to call and ask to speak to one of our nurses for clinical concerns.  If you have a medical emergency, go to the nearest emergency room or call 911.  A surgeon from [facility] is always on call at the [facility], [address], [place], [place], [place] [address] ?  MAIN: [phone] ? TOLL FREE: [phone] ?  FAX [phone])  [phone]  [email or web address]

## 2015-12-04 ENCOUNTER — Telehealth: Payer: Self-pay

## 2015-12-04 NOTE — Telephone Encounter (Signed)
Spoke with patient regarding transition care management.  She reports she is doing well and declines HFU wit PCP at this time.  Scheduled hospital follow up with surgeon 12/16/15.  Encouraged to call this office if there are any concerns or condition worsens.

## 2015-12-07 NOTE — Anesthesia Postprocedure Evaluation (Signed)
Anesthesia Post Note  Patient: Dawn Herring  Procedure(s) Performed: Procedure(s) (LRB): APPENDECTOMY LAPAROSCOPIC (N/A)  Patient location during evaluation: PACU Anesthesia Type: General Level of consciousness: awake and alert and patient cooperative Pain management: pain level controlled Vital Signs Assessment: post-procedure vital signs reviewed and stable Respiratory status: spontaneous breathing and respiratory function stable Cardiovascular status: stable Anesthetic complications: no    Last Vitals:  Vitals:   12/03/15 0658 12/03/15 0800  BP: (!) 98/47 (!) 107/52  Pulse: 66 65  Resp: 16 18  Temp: 36.8 C 36.9 C    Last Pain:  Vitals:   12/03/15 0829  TempSrc:   PainSc: [place]

## 2015-12-29 DIAGNOSIS — H5213 Myopia, bilateral: Secondary | ICD-10-CM | POA: Diagnosis not present

## 2015-12-29 DIAGNOSIS — H2513 Age-related nuclear cataract, bilateral: Secondary | ICD-10-CM | POA: Diagnosis not present

## 2016-01-21 DIAGNOSIS — H25812 Combined forms of age-related cataract, left eye: Secondary | ICD-10-CM | POA: Diagnosis not present

## 2016-01-21 DIAGNOSIS — H2512 Age-related nuclear cataract, left eye: Secondary | ICD-10-CM | POA: Diagnosis not present

## 2016-02-11 DIAGNOSIS — H25811 Combined forms of age-related cataract, right eye: Secondary | ICD-10-CM | POA: Diagnosis not present

## 2016-02-11 DIAGNOSIS — H2511 Age-related nuclear cataract, right eye: Secondary | ICD-10-CM | POA: Diagnosis not present

## 2016-02-16 ENCOUNTER — Telehealth: Payer: Self-pay | Admitting: Cardiovascular Disease

## 2016-02-16 NOTE — Telephone Encounter (Signed)
Spoke w/ pt.  She reports weakness and irregular HR. She reports that when she lies on her side, she can feel her heart beat.  Reports HR 60-70s, BP has been good.  She recent had her cataracts removed and was told that she needed to be seen due to arrythmia. Pt cares for her 71 y/o father, brother recently passed and sister is not doing well. Pt would like evaluation, before Thanksgiving, if possible.  Pt sched to see Dr. Rockey Situ tomorrow @ 3:20, as there was a cancellation.  She is appreciative and will call back w/ any questions before that time.

## 2016-02-16 NOTE — Telephone Encounter (Signed)
Pt is having chest pressure. Pain between shoulder blades in September.   Pt c/o of Chest Pain: STAT if CP now or developed within 24 hours  1. Are you having CP right now? no  2. Are you experiencing any other symptoms (ex. SOB, nausea, vomiting, sweating)? No Energy, very weak, has trouble at times catching her breath  3. How long have you been experiencing CP? Since September, but got better  4. Is your CP continuous or coming and going? Comes and goes  5. Have you taken Nitroglycerin? no?

## 2016-02-17 ENCOUNTER — Encounter: Payer: Self-pay | Admitting: Cardiovascular Disease

## 2016-02-17 ENCOUNTER — Ambulatory Visit (INDEPENDENT_AMBULATORY_CARE_PROVIDER_SITE_OTHER): Payer: Medicare Other | Admitting: Cardiovascular Disease

## 2016-02-17 VITALS — BP 136/82 | HR 58 | Ht 66.0 in | Wt 186.5 lb

## 2016-02-17 DIAGNOSIS — R002 Palpitations: Secondary | ICD-10-CM | POA: Diagnosis not present

## 2016-02-17 DIAGNOSIS — I2510 Atherosclerotic heart disease of native coronary artery without angina pectoris: Secondary | ICD-10-CM

## 2016-02-17 DIAGNOSIS — Z23 Encounter for immunization: Secondary | ICD-10-CM

## 2016-02-17 DIAGNOSIS — I159 Secondary hypertension, unspecified: Secondary | ICD-10-CM

## 2016-02-17 DIAGNOSIS — I10 Essential (primary) hypertension: Secondary | ICD-10-CM | POA: Diagnosis not present

## 2016-02-17 DIAGNOSIS — E7800 Pure hypercholesterolemia, unspecified: Secondary | ICD-10-CM

## 2016-02-17 NOTE — Patient Instructions (Addendum)
Medication Instructions:   No medication changes made   Fish oil for triglycerides  praluent after labs next week.  Labwork:  Liver and lipids on Monday Am, fasting  Testing/Procedures:  No further testing at this time   I recommend watching educational videos on topics of interest to you at:       [email or web address]  Enter code: HEARTCARE    Follow-Up: It was a pleasure seeing you in the office today. Please call us if you have new issues that need to be addressed before your next appt.  [phone]  Your physician wants you to follow-up in: 6 months.  You will receive a reminder letter in the mail two months in advance. If you don't receive a letter, please call our office to schedule the follow-up appointment.  If you need a refill on your cardiac medications before your next appointment, please call your pharmacy.

## 2016-02-17 NOTE — Progress Notes (Signed)
Cardiology Office Note  Date:  02/17/2016   ID:  Dawn Herring, Dawn Herring 10/25/1945, MRN [id number]  PCP:  Tommi Rumps, MD   Chief Complaint  Patient presents with  . other    Pt. c/o irreg. heart beats, weakness, chest pressure and between shoulder blades.     HPI:  Dawn Herring is a very pleasant 70 year old woman with past medical history of hypertension, familial hypercholesterolemia, with remote history of chest pain and atrial tachycardia, previous stress test in 2005, also in 2008 or 2009 while at [facility], Who presents for routine followup of her hyperlipidemia hypertension, and arrhythmia Prior CT coronary calcium score  190,  started on more aggressive cholesterol regiment at that time She presents today for follow-up of her tachycardia and hyperlipidemia   In follow-up, she reports having rare episodes of tachycardia In the middle of September: rate 130 bpm middle of the night Symptoms resolved on their own  She hadCataract surgery: sept 26th, Second cataract surgery  last Thursday, anesthesia noted irregular rhythm She did not notice any symptoms Reports that she has not felt well since that time, feels weak  Off pralunt, changed to repatha,  She received samples from the office, did not the to fill her prescription despite aggressive marketing from specialists pharmacy. Was told that they would cancel her prescription. She was paying $600 per month and reports that she was able to afford this. Prescription now canceled? Has been without medication for several months  Previous 30 day monitor again reviewed with her showing runs of atrial tachycardia, run of SVT, APCs, PVCs  EKG on today's visit showing sinus bradycardia rate 58 bpm, nonspecific diffuse T-wave abnormality  Other past medical history reviewed Previously had significant myalgias on the fluvastatin  Symptoms improved by holding the medication Previously had gallbladder attack   and in the setting of severe pain, had tachycardia concerning for arrhythmia  Significant stress at home, taking care of 2 elderly family members in their 45s. Prior episode of tachycardia concerning for arrhythmia in the setting of back pain in the past  Strong family history of familial hypercholesterolemia.  She has tried numerous cholesterol medications in the past including Crestor, Lipitor, lovastatin. She did not tolerate these secondary to side effects.   She has been taking care of her father 68 years old, history of stroke. She sleeps with him 3-4 nights per week to take care of him She also reports having a periodic rash on her arms and legs and wonders if this could be from her medications   PMH:   has a past medical history of Chest pain; Essential hypertension; Hiatal hernia; History of DVT (deep vein thrombosis); Hyperlipidemia; and Palpitations.  PSH:    Past Surgical History:  Procedure Laterality Date  . CATARACT EXTRACTION, BILATERAL    . LAPAROSCOPIC APPENDECTOMY N/A 12/02/2015   Procedure: APPENDECTOMY LAPAROSCOPIC;  Surgeon: Clovis Riley, MD;  Location: [facility];  Service: General;  Laterality: N/A;  . ROOT CANAL  2013  . ROOT CANAL    . VAGINAL HYSTERECTOMY     abdominal hysterectomy    Current Outpatient Prescriptions  Medication Sig Dispense Refill  . aspirin 81 MG tablet Take 162 mg by mouth daily.    . colchicine 0.6 MG tablet Take 1 tablet (0.6 mg total) by mouth daily. As needed for gout flare 30 tablet 0  . ezetimibe (ZETIA) 10 MG tablet TAKE 1 TABLET BY MOUTH EVERY DAY 90 tablet 3  .  omeprazole (PRILOSEC) 20 MG capsule Take 20 mg by mouth daily.      Marland Kitchen oxyCODONE (OXY IR/ROXICODONE) 5 MG immediate release tablet Take 1 tablet (5 mg total) by mouth every 4 (four) hours as needed for severe pain. 30 tablet 0  . valsartan-hydrochlorothiazide (DIOVAN-HCT) 160-25 MG tablet TAKE 1 TABLET BY MOUTH EVERY DAY 90 tablet 3   No current facility-administered  medications for this visit.      Allergies:   Statins   Social History:  The patient  reports that she has never smoked. She has never used smokeless tobacco. She reports that she does not drink alcohol or use drugs.   Family History:   family history includes Heart disease in her father and mother; Thyroid cancer in her brother.    Review of Systems: Review of Systems  Constitutional: Negative.   Respiratory: Negative.   Cardiovascular: Negative.        Tachycardia  Gastrointestinal: Negative.   Musculoskeletal: Negative.   Neurological: Negative.   Psychiatric/Behavioral: Negative.  Suicidal ideas:    All other systems reviewed and are negative.    PHYSICAL EXAM: VS:  BP 136/82 (BP Location: Left Arm, Patient Position: Sitting, Cuff Size: Normal)   Pulse (!) 58   Ht 5\' 6"  (1.676 m)   Wt 186 lb 8 oz (84.6 kg)   BMI 30.10 kg/m  , BMI Body mass index is 30.1 kg/m. GEN: Well nourished, well developed, in no acute distress, obese  HEENT: normal  Neck: no JVD, carotid bruits, or masses Cardiac: RRR; no murmurs, rubs, or gallops,no edema  Respiratory:  clear to auscultation bilaterally, normal work of breathing GI: soft, nontender, nondistended, + BS MS: no deformity or atrophy  Skin: warm and dry, no rash Neuro:  Strength and sensation are intact Psych: euthymic mood, full affect    Recent Labs: 12/02/2015: ALT 14; BUN 13; Creatinine, Ser 1.03; Hemoglobin 14.4; Platelets 255; Potassium 3.9; Sodium 139    Lipid Panel Lab Results  Component Value Date   CHOL 161 11/10/2015   HDL 46 11/10/2015   [place] Comment 11/10/2015   TRIG 432 (H) 11/10/2015      Wt Readings from Last 3 Encounters:  02/17/16 186 lb 8 oz (84.6 kg)  12/02/15 182 lb (82.6 kg)  04/28/15 180 lb 12.8 oz (82 kg)       ASSESSMENT AND PLAN:  Coronary artery disease involving native coronary artery of native heart without angina pectoris - Plan: EKG 12-Lead Currently with no symptoms of  angina. No further workup at this time. Continue current medication regimen.  Essential hypertension - Plan: EKG 12-Lead Blood pressure is well controlled on today's visit. No changes made to the medications.  Secondary hypertension - Plan: EKG 12-Lead  Palpitations - Plan: EKG 12-Lead Previous event monitor showing episodes of atrial tachycardia, APCs, PVCs even short runs of SVT. She is having rare episodes of tachycardia. Recommended if symptoms get worse, could use small doses of propranolol though given bradycardia, options are limited  Encounter for immunization - Plan: Flu Vaccine QUAD 36+ mos IM Flu shot provided  Hyperlipidemia Reports that her prescription for repatha was discontinued because she did not pay for new medication. She was pink $600, was able to afford this, but was finishing up sample had been provided. She reports specialty pharmacy canceled the prescription and was very rude. She prefers to stay with praluent as trouble started when she changed to repatha.  Atrial tachycardia Monitor reviewed with her again in  detail. Recommended she call if she has worsening symptoms   Total encounter time more than 25 minutes  Greater than 50% was spent in counseling and coordination of care with the patient   Disposition:   F/U  6 months  Orders Placed This Encounter  Procedures  . Flu Vaccine QUAD 36+ mos IM  . EKG 12-Lead     Signed, Esmond Plants, M.D., Ph.D. 02/17/2016  [place], [place]

## 2016-02-22 ENCOUNTER — Other Ambulatory Visit (INDEPENDENT_AMBULATORY_CARE_PROVIDER_SITE_OTHER): Payer: Medicare Other

## 2016-02-22 DIAGNOSIS — R002 Palpitations: Secondary | ICD-10-CM

## 2016-02-22 DIAGNOSIS — I159 Secondary hypertension, unspecified: Secondary | ICD-10-CM | POA: Diagnosis not present

## 2016-02-22 DIAGNOSIS — I10 Essential (primary) hypertension: Secondary | ICD-10-CM

## 2016-02-22 DIAGNOSIS — I2510 Atherosclerotic heart disease of native coronary artery without angina pectoris: Secondary | ICD-10-CM | POA: Diagnosis not present

## 2016-02-23 LAB — LIPID PANEL
CHOL/HDL RATIO: 5.8 ratio — AB (ref 0.0–4.4)
Cholesterol, Total: 303 mg/dL — ABNORMAL HIGH (ref 100–199)
HDL: 52 mg/dL (ref >39)
LDL Calculated: 193 mg/dL — ABNORMAL HIGH (ref 0–99)
Triglycerides: 290 mg/dL — ABNORMAL HIGH (ref 0–149)
VLDL Cholesterol Cal: 58 mg/dL — ABNORMAL HIGH (ref 5–40)

## 2016-02-23 LAB — HEPATIC FUNCTION PANEL
ALBUMIN: 4.3 g/dL (ref 3.5–4.8)
ALK PHOS: 64 IU/L (ref 39–117)
ALT: 13 IU/L (ref 0–32)
AST: 19 IU/L (ref 0–40)
BILIRUBIN TOTAL: 0.3 mg/dL (ref 0.0–1.2)
Bilirubin, Direct: 0.07 mg/dL (ref 0.00–0.40)
Total Protein: 7 g/dL (ref 6.0–8.5)

## 2016-03-20 ENCOUNTER — Other Ambulatory Visit: Payer: Self-pay | Admitting: Cardiovascular Disease

## 2016-04-28 ENCOUNTER — Ambulatory Visit (INDEPENDENT_AMBULATORY_CARE_PROVIDER_SITE_OTHER): Payer: Medicare Other | Admitting: Cardiovascular Disease

## 2016-04-28 ENCOUNTER — Encounter: Payer: Self-pay | Admitting: Cardiovascular Disease

## 2016-04-28 VITALS — BP 144/90 | HR 59 | Ht 66.0 in | Wt 188.5 lb

## 2016-04-28 DIAGNOSIS — I2510 Atherosclerotic heart disease of native coronary artery without angina pectoris: Secondary | ICD-10-CM

## 2016-04-28 DIAGNOSIS — I499 Cardiac arrhythmia, unspecified: Secondary | ICD-10-CM

## 2016-04-28 DIAGNOSIS — I10 Essential (primary) hypertension: Secondary | ICD-10-CM

## 2016-04-28 DIAGNOSIS — E7800 Pure hypercholesterolemia, unspecified: Secondary | ICD-10-CM

## 2016-04-28 DIAGNOSIS — E7801 Familial hypercholesterolemia: Secondary | ICD-10-CM | POA: Diagnosis not present

## 2016-04-28 MED ORDER — PROPRANOLOL HCL 10 MG PO TABS: 10.0000 mg | ORAL_TABLET | Freq: Three times a day (TID) | ORAL | 3 refills | Status: DC | PRN

## 2016-04-28 MED ORDER — PROPRANOLOL HCL 10 MG PO TABS: 10.0000 mg | ORAL_TABLET | Freq: Three times a day (TID) | ORAL | 3 refills | Status: DC

## 2016-04-28 NOTE — Patient Instructions (Addendum)
Medication Instructions:   Please take propranolol 10 mg as needed for palpitations  Labwork:  Fasting lipid panel today with LFTs  Testing/Procedures:  No further testing at this time  Please call for worsening palpitations or chest pain   I recommend watching educational videos on topics of interest to you at:       [email or web address]  Enter code: HEARTCARE    Follow-Up: It was a pleasure seeing you in the office today. Please call us if you have new issues that need to be addressed before your next appt.  [phone]  Your physician wants you to follow-up in: 6 months.  You will receive a reminder letter in the mail two months in advance. If you don't receive a letter, please call our office to schedule the follow-up appointment.  If you need a refill on your cardiac medications before your next appointment, please call your pharmacy.

## 2016-04-28 NOTE — Progress Notes (Signed)
Cardiology Office Note  Date:  04/28/2016   ID:  Monday, Amthor 1946/01/11, MRN [id number]  PCP:  Tommi Rumps, MD   Chief Complaint  Patient presents with  . other    Pt. c/o chest pressure with rapid heart beats and feeling fatigue. Meds reviewed by the pt's bottles.     HPI:  Ms. Fruge is a very pleasant 71 year old woman with past medical history of hypertension, familial hypercholesterolemia, with remote history of chest pain and atrial tachycardia, previous stress test in 2005, also in 2008 or 2009 while at [facility], Who presents for routine followup of her hyperlipidemia hypertension, and arrhythmia Prior CT coronary calcium score  190,  started on more aggressive cholesterol regiment at that time She presents today for follow-up of her tachycardia and hyperlipidemia  PVCs seen on prior EKGs Event monitor December 2016 showing APCs, runs of atrial tachycardia  In follow-up today, she feels that something is going on Nov and Dec with lots of palpitations Chest pressure in Jan, atypical, coming on at rest, not typically associated with exertion Aware of heart beat at night Pain between shoulder blades at times, takes aleve  On praluent samples from our office Unable to afford the medication, was costing more than $5000 per year  Reports the palpitations are better in the past month Now tired at times, having episodes Weight up 6 pounds over the past 2 months Significant stress with dad,100 yo,  paid 50K for care last year Lost mother in the 28s, Lost brother from heart disease  EKG on today's visit shows normal sinus rhythm with rate 58 bpm, T-wave abnormality through the anterior precordial leads, no change compared to prior EKG  Other past medical history reviewed Cataract surgery: sept 26th, 2017 Second cataract surgery  last Thursday, anesthesia noted irregular rhythm She did not notice any symptoms Reports that she has not felt  well since that time, feels weak  Off pralunt, changed to repatha,  She received samples from the office, did not the to fill her prescription despite aggressive marketing from [facility] pharmacy. Was told that they would cancel her prescription. She was paying $600 per month and reports that she was able to afford this. Prescription now canceled? Has been without medication for several months  Previous 30 day monitor again reviewed with her showing runs of atrial tachycardia, run of SVT, APCs, PVCs  Previously had significant myalgias on the fluvastatin  Symptoms improved by holding the medication Previously had gallbladder attack  and in the setting of severe pain, had tachycardia concerning for arrhythmia  Prior episode of tachycardia concerning for arrhythmia in the setting of back pain in the past  Strong family history of familial hypercholesterolemia.  She has tried numerous cholesterol medications in the past including Crestor, Lipitor, lovastatin. She did not tolerate these secondary to side effects.    PMH:   has a past medical history of Chest pain; Essential hypertension; Hiatal hernia; History of DVT (deep vein thrombosis); Hyperlipidemia; and Palpitations.  PSH:    Past Surgical History:  Procedure Laterality Date  . CATARACT EXTRACTION, BILATERAL    . LAPAROSCOPIC APPENDECTOMY N/A 12/02/2015   Procedure: APPENDECTOMY LAPAROSCOPIC;  Surgeon: Clovis Riley, MD;  Location: [facility];  Service: General;  Laterality: N/A;  . ROOT CANAL  2013  . ROOT CANAL    . VAGINAL HYSTERECTOMY     abdominal hysterectomy    Current Outpatient Prescriptions  Medication Sig Dispense Refill  .  aspirin 81 MG tablet Take 162 mg by mouth daily.    . colchicine 0.6 MG tablet Take 1 tablet (0.6 mg total) by mouth daily. As needed for gout flare 30 tablet 0  . ezetimibe (ZETIA) 10 MG tablet TAKE 1 TABLET BY MOUTH EVERY DAY 90 tablet 3  . omeprazole (PRILOSEC) 20 MG capsule Take 20 mg by  mouth daily.      . valsartan-hydrochlorothiazide (DIOVAN-HCT) 160-25 MG tablet TAKE 1 TABLET BY MOUTH EVERY DAY 90 tablet 3   No current facility-administered medications for this visit.      Allergies:   Statins   Social History:  The patient  reports that she has never smoked. She has never used smokeless tobacco. She reports that she does not drink alcohol or use drugs.   Family History:   family history includes Heart disease in her father and mother; Thyroid cancer in her brother.    Review of Systems: Review of Systems  Constitutional: Negative.   Respiratory: Negative.   Cardiovascular: Positive for chest pain and palpitations.  Gastrointestinal: Negative.   Musculoskeletal: Negative.   Neurological: Negative.   All other systems reviewed and are negative.    PHYSICAL EXAM: VS:  BP (!) 144/90 (BP Location: Left Arm, Patient Position: Sitting, Cuff Size: Large)   Pulse (!) 59   Ht 5\' 6"  (1.676 m)   Wt 188 lb 8 oz (85.5 kg)   BMI 30.42 kg/m  , BMI Body mass index is 30.42 kg/m. GEN: Well nourished, well developed, in no acute distress , obese HEENT: normal  Neck: no JVD, carotid bruits, or masses Cardiac: RRR; no murmurs, rubs, or gallops,no edema  Respiratory:  clear to auscultation bilaterally, normal work of breathing GI: soft, nontender, nondistended, + BS MS: no deformity or atrophy  Skin: warm and dry, no rash Neuro:  Strength and sensation are intact Psych: euthymic mood, full affect    Recent Labs: 12/02/2015: BUN 13; Creatinine, Ser 1.03; Hemoglobin 14.4; Platelets 255; Potassium 3.9; Sodium 139 02/22/2016: ALT 13    Lipid Panel Lab Results  Component Value Date   CHOL 303 (H) 02/22/2016   HDL 52 02/22/2016   LDLCALC 193 (H) 02/22/2016   TRIG 290 (H) 02/22/2016      Wt Readings from Last 3 Encounters:  04/28/16 188 lb 8 oz (85.5 kg)  02/17/16 186 lb 8 oz (84.6 kg)  12/02/15 182 lb (82.6 kg)       ASSESSMENT AND PLAN:  Pure  hypercholesterolemia - Plan: EKG 12-Lead Order place to have lipids and liver checked  Familial hypercholesterolemia - Plan: EKG 12-Lead Samples again provided of praluent  she is unable to afford the medication Spent $5000 last year  Coronary artery disease involving native coronary artery of native heart without angina pectoris - Plan: EKG 12-Lead Atypical chest pain symptoms, talked about doing stress testing. We'll wait for now as symptoms are atypical  Cardiac arrhythmia, unspecified cardiac arrhythmia type - Plan: EKG 12-Lead Having palpitations, possibly having short runs of atrial tachycardia Recommended she take propranolol as needed If symptoms persist, could try metoprolol daily but blood pressure and heart rate are low at baseline  Essential hypertension We have encouraged continued exercise, careful diet management in an effort to lose weight.   Total encounter time more than 25 minutes  Greater than 50% was spent in counseling and coordination of care with the patient   Disposition:   F/U  12 months   Orders Placed This Encounter  Procedures  . EKG 12-Lead     Signed, Esmond Plants, M.D., Ph.D. 04/28/2016  [place], [place]

## 2016-04-29 LAB — HEPATIC FUNCTION PANEL
ALK PHOS: 65 IU/L (ref 39–117)
ALT: 14 IU/L (ref 0–32)
AST: 17 IU/L (ref 0–40)
Albumin: 4.4 g/dL (ref 3.5–4.8)
Bilirubin Total: 0.4 mg/dL (ref 0.0–1.2)
Bilirubin, Direct: 0.08 mg/dL (ref 0.00–0.40)
Total Protein: 7.3 g/dL (ref 6.0–8.5)

## 2016-04-29 LAB — LIPID PANEL
CHOL/HDL RATIO: 3.5 ratio (ref 0.0–4.4)
CHOLESTEROL TOTAL: 187 mg/dL (ref 100–199)
HDL: 53 mg/dL (ref >39)
LDL Calculated: 71 mg/dL (ref 0–99)
TRIGLYCERIDES: 313 mg/dL — AB (ref 0–149)
VLDL Cholesterol Cal: 63 mg/dL — ABNORMAL HIGH (ref 5–40)

## 2016-05-24 ENCOUNTER — Other Ambulatory Visit: Payer: Self-pay | Admitting: Cardiovascular Disease

## 2016-07-08 ENCOUNTER — Telehealth: Payer: Self-pay | Admitting: Family Medicine

## 2016-07-08 NOTE — Telephone Encounter (Signed)
Called patient to address gaps in Quality metrics , left message for patient to call office. Need to schedule appointment with PCP fo r30 minute follow up. Last OV 2016.

## 2016-07-08 NOTE — Telephone Encounter (Signed)
Noted  

## 2016-07-08 NOTE — Telephone Encounter (Signed)
Appointment has been scheduled with PCP to cover quality metric gaps in care colonoscopy and follow up patient not been seen since 02/2015. Needs Mammogram order pneumonia vaccine and to document colonoscopy. FYI

## 2016-07-13 ENCOUNTER — Telehealth: Payer: Self-pay | Admitting: Cardiovascular Disease

## 2016-07-13 NOTE — Telephone Encounter (Signed)
Pt states she is in need of some Praluent 75 mg. She will be in the area tomorrow, and would like to pick some up then.

## 2016-07-13 NOTE — Telephone Encounter (Signed)
Notified pt that Praluent 75mg  samples ready for pick up. Placed back in refrigerator in a bag w/pt's name.  Samples of this drug were given to the patient, quantity 2, Lot Number [id number] Exp 10/2016

## 2016-07-14 ENCOUNTER — Ambulatory Visit (INDEPENDENT_AMBULATORY_CARE_PROVIDER_SITE_OTHER): Payer: Medicare Other | Admitting: Family Medicine

## 2016-07-14 ENCOUNTER — Ambulatory Visit: Payer: Medicare Other

## 2016-07-14 ENCOUNTER — Encounter: Payer: Self-pay | Admitting: Family Medicine

## 2016-07-14 VITALS — BP 104/62 | HR 58 | Temp 98.1°F | Ht 66.0 in | Wt 191.0 lb

## 2016-07-14 DIAGNOSIS — Z23 Encounter for immunization: Secondary | ICD-10-CM | POA: Diagnosis not present

## 2016-07-14 DIAGNOSIS — I2510 Atherosclerotic heart disease of native coronary artery without angina pectoris: Secondary | ICD-10-CM

## 2016-07-14 DIAGNOSIS — Z1239 Encounter for other screening for malignant neoplasm of breast: Secondary | ICD-10-CM

## 2016-07-14 DIAGNOSIS — Z1231 Encounter for screening mammogram for malignant neoplasm of breast: Secondary | ICD-10-CM

## 2016-07-14 DIAGNOSIS — I499 Cardiac arrhythmia, unspecified: Secondary | ICD-10-CM

## 2016-07-14 DIAGNOSIS — E785 Hyperlipidemia, unspecified: Secondary | ICD-10-CM | POA: Diagnosis not present

## 2016-07-14 NOTE — Progress Notes (Addendum)
Dawn Rumps, MD Phone: [phone]  Dawn Herring is a 71 y.o. female who presents today for follow-up.  Patient with history of PACs, PVCs, and atrial tachycardia. Followed by cardiology. Last had palpitations about 2 weeks ago. She does take propranolol 10 mg once daily and occasionally takes an extra dose later in the day if she develops symptoms. Occasionally feels as though it takes her breath away. She does note about a week ago she was outside mowing the yard and noticed some centralized chest pressure that worsened with exertion and improved and resolved with rest. No radiation. Notes she has not had that type of discomfort previously though has had discomfort in her chest and back at rest that was described as a pressure. She had a fairly low coronary calcium score 2 years ago. She's not had any recurrence of the chest pain. Currently on praluent for her cholesterol. Also taking fish oil. Denies lightheadedness.  PMH: nonsmoker.   ROS see history of present illness  Objective  Physical Exam Vitals:   07/14/16 1114 07/14/16 1339  BP: 104/62   Pulse: (!) 50 (!) 58  Temp: 98.1 F (36.7 C)     BP Readings from Last 3 Encounters:  07/14/16 104/62  04/28/16 (!) 144/90  02/17/16 136/82   Wt Readings from Last 3 Encounters:  07/14/16 191 lb (86.6 kg)  04/28/16 188 lb 8 oz (85.5 kg)  02/17/16 186 lb 8 oz (84.6 kg)    Physical Exam  Constitutional: No distress.  Cardiovascular: Normal heart sounds.   Bradycardic  Pulmonary/Chest: Effort normal and breath sounds normal.  Musculoskeletal: She exhibits no edema.  Neurological: She is alert. Gait normal.  Skin: Skin is warm and dry. She is not diaphoretic.   EKG: Sinus bradycardia, rate 47, negative T waves in the precordial leads and leads 1 and aVL that are similar to prior  Assessment/Plan: Please see individual problem list.  Arrhythmia History of this. Followed by cardiology. Takes propranolol daily for this.  EKG revealed sinus bradycardia that was fairly profound at a rate of 47. I discussed with her cardiologist. Her blood pressure has tolerated this. He advised that she gingerly take the propranolol and I discussed with the patient decreasing the dose to 5 mg and only taking a second dose if needed. Dr Rockey Situ advised if she continues to have issues with bradycardia with beta blocker and continues to have issues with palpitations they could consider some antiarrhythmic to see if that would be beneficial. We will see how she does and she will keep her follow-up with cardiology in about a month and a half.  CAD (coronary artery disease) Discussed the patient's episode of chest pain with her cardiologist. He reviewed her EKG which appeared similar to prior with the exception of the bradycardia. Noted that her coronary calcium score was fairly low and he doubts that she has any significant coronary artery disease to be causing symptoms. He advised to let the patient decide whether or not she wants to monitor this or proceed with further evaluation with treadmill stress test. I discussed with the patient and she opted to continue to monitor. Advised that if she has recurrence or any new symptoms she should be evaluated.  Hyperlipidemia She'll continue current medications.   Orders Placed This Encounter  Procedures  . MM Digital Screening    Standing Status:   Future    Standing Expiration Date:   09/13/2017    Order Specific Question:   Reason for Exam (  SYMPTOM  OR DIAGNOSIS REQUIRED)    Answer:   breast cancer screening    Order Specific Question:   Preferred imaging location?    Answer:   [facility]  . Pneumococcal conjugate vaccine 13-valent  . EKG 12-Lead    Meds ordered this encounter  Medications  . Omega-3 Fatty Acids (FISH OIL) 1000 MG CAPS    Sig: Take by mouth.    # Healthcare maintenance: Prevnar given in the office. Mammogram ordered. Reports having had a colonoscopy 7 years ago  with no polyps.   Dawn Rumps, MD [place]

## 2016-07-14 NOTE — Assessment & Plan Note (Signed)
Discussed the patient's episode of chest pain with her cardiologist. He reviewed her EKG which appeared similar to prior with the exception of the bradycardia. Noted that her coronary calcium score was fairly low and he doubts that she has any significant coronary artery disease to be causing symptoms. He advised to let the patient decide whether or not she wants to monitor this or proceed with further evaluation with treadmill stress test. I discussed with the patient and she opted to continue to monitor. Advised that if she has recurrence or any new symptoms she should be evaluated.

## 2016-07-14 NOTE — Patient Instructions (Signed)
Nice to see you. Please decrease your propranolol dose to 5 mg. Please monitor for lightheadedness and monitor your blood pressure. If it drops too low let us know. Please monitor for recurrent chest pain. If it does recur please let us her cardiologist's office know.

## 2016-07-14 NOTE — Assessment & Plan Note (Signed)
History of this. Followed by cardiology. Takes propranolol daily for this. EKG revealed sinus bradycardia that was fairly profound at a rate of 47. I discussed with her cardiologist. Her blood pressure has tolerated this. He advised that she gingerly take the propranolol and I discussed with the patient decreasing the dose to 5 mg and only taking a second dose if needed. Dr Rockey Situ advised if she continues to have issues with bradycardia with beta blocker and continues to have issues with palpitations they could consider some antiarrhythmic to see if that would be beneficial. We will see how she does and she will keep her follow-up with cardiology in about a month and a half.

## 2016-07-14 NOTE — Assessment & Plan Note (Signed)
She'll continue current medications.

## 2016-07-14 NOTE — Progress Notes (Signed)
Pre visit review using our clinic review tool, if applicable. No additional management support is needed unless otherwise documented below in the visit note. 

## 2016-07-26 ENCOUNTER — Other Ambulatory Visit: Payer: Self-pay | Admitting: Family Medicine

## 2016-07-26 DIAGNOSIS — Z1231 Encounter for screening mammogram for malignant neoplasm of breast: Secondary | ICD-10-CM

## 2016-08-07 ENCOUNTER — Other Ambulatory Visit: Payer: Self-pay | Admitting: Cardiovascular Disease

## 2016-08-19 ENCOUNTER — Ambulatory Visit
Admission: RE | Admit: 2016-08-19 | Discharge: 2016-08-19 | Disposition: A | Discharge status: Home / Self Care | Payer: Medicare Other | Source: Ambulatory Visit | Attending: Family Medicine | Admitting: Family Medicine

## 2016-08-19 DIAGNOSIS — Z1231 Encounter for screening mammogram for malignant neoplasm of breast: Secondary | ICD-10-CM

## 2016-08-21 ENCOUNTER — Other Ambulatory Visit: Payer: Self-pay | Admitting: Cardiovascular Disease

## 2016-08-24 ENCOUNTER — Ambulatory Visit (INDEPENDENT_AMBULATORY_CARE_PROVIDER_SITE_OTHER): Payer: Medicare Other

## 2016-08-24 VITALS — BP 118/74 | HR 52 | Temp 98.3°F | Resp 14 | Ht 64.0 in | Wt 191.8 lb

## 2016-08-24 DIAGNOSIS — Z0000 Encounter for general adult medical examination without abnormal findings: Secondary | ICD-10-CM

## 2016-08-24 NOTE — Progress Notes (Signed)
Subjective:   Dawn Herring is a 71 y.o. female who presents for an Initial Medicare Annual Wellness Visit.  Review of Systems    No ROS.  Medicare Wellness Visit.  Cardiac Risk Factors include: advanced age (>70men, >62 women);hypertension;obesity (BMI >30kg/m2)     Objective:    Today's Vitals   08/24/16 1612  BP: 118/74  Pulse: (!) 52  Resp: 14  Temp: 98.3 F (36.8 C)  TempSrc: Oral  SpO2: 95%  Weight: 191 lb 12.8 oz (87 kg)  Height: 5\' 4"  (1.626 m)   Body mass index is 32.92 kg/m.   Current Medications (verified) Outpatient Encounter Prescriptions as of 08/24/2016  Medication Sig  . Alirocumab (PRALUENT) 75 MG/ML SOPN Inject 1 pen into the skin every 14 (fourteen) days.  Marland Kitchen aspirin 81 MG tablet Take 162 mg by mouth daily.  . colchicine 0.6 MG tablet Take 1 tablet (0.6 mg total) by mouth daily. As needed for gout flare  . ezetimibe (ZETIA) 10 MG tablet TAKE 1 TABLET BY MOUTH EVERY DAY  . Omega-3 Fatty Acids (FISH OIL) 1000 MG CAPS Take by mouth.  Marland Kitchen omeprazole (PRILOSEC) 20 MG capsule Take 20 mg by mouth daily.    . propranolol (INDERAL) 10 MG tablet Take 1 tablet (10 mg total) by mouth 3 (three) times daily as needed (palpitations). (Patient taking differently: Take 10 mg by mouth daily. )  . valsartan-hydrochlorothiazide (DIOVAN-HCT) 160-25 MG tablet TAKE 1 TABLET BY MOUTH EVERY DAY  . [DISCONTINUED] propranolol (INDERAL) 10 MG tablet TAKE 1 TABLET(10 MG) BY MOUTH THREE TIMES DAILY   No facility-administered encounter medications on file as of 08/24/2016.     Allergies (verified) Statins   History: Past Medical History:  Diagnosis Date  . Chest pain    a. Neg stress tests - 2005, 2008, 2009;  b. 08/2014 Cardiac CT: Ca score 193 (80th%'ile).  . Essential hypertension   . Hiatal hernia    a. 08/2014 CT chest: large hiatal hernia.  Marland Kitchen History of DVT (deep vein thrombosis)   . Hyperlipidemia    a. statin intolerant;  b. on zetia and repatha.  . Palpitations    a. 02/2015 Event Monitor: NSR w/ rare, short episodes of A Tach, rare short runs of SVT < 15 beats.   Past Surgical History:  Procedure Laterality Date  . CATARACT EXTRACTION, BILATERAL    . LAPAROSCOPIC APPENDECTOMY N/A 12/02/2015   Procedure: APPENDECTOMY LAPAROSCOPIC;  Surgeon: Clovis Riley, MD;  Location: [facility];  Service: General;  Laterality: N/A;  . ROOT CANAL  2013  . ROOT CANAL    . VAGINAL HYSTERECTOMY     abdominal hysterectomy   Family History  Problem Relation Age of Onset  . Heart disease Mother   . Heart disease Father   . Colon cancer Unknown        Grandparent  . Thyroid cancer Brother    Social History   Occupational History  . Not on file.   Social History Main Topics  . Smoking status: Never Smoker  . Smokeless tobacco: Never Used  . Alcohol use No  . Drug use: No  . Sexual activity: Yes    Tobacco Counseling Counseling given: Not Answered   Activities of Daily Living In your present state of health, do you have any difficulty performing the following activities: 08/24/2016 12/03/2015  Hearing? N N  Vision? N N  Difficulty concentrating or making decisions? N N  Walking or climbing stairs? N N  Dressing or bathing? N N  Doing errands, shopping? N N  Preparing Food and eating ? N -  Using the Toilet? N -  In the past six months, have you accidently leaked urine? N -  Do you have problems with loss of bowel control? N -  Managing your Medications? N -  Managing your Finances? N -  Housekeeping or managing your Housekeeping? N -  Some recent data might be hidden    Immunizations and Health Maintenance Immunization History  Administered Date(s) Administered  . Hepatitis B, adult 03/17/2015, 04/21/2015, 09/15/2015  . Influenza,inj,Quad PF,36+ Mos 03/03/2015, 02/17/2016  . Pneumococcal Conjugate-13 07/14/2016   Health Maintenance Due  Topic Date Due  . TETANUS/TDAP  03/12/1964  . COLONOSCOPY  03/13/1995  . DEXA SCAN  03/12/2010     Patient Care Team: Leone Haven, MD as PCP - General (Family Medicine) Rockey Situ Kathlene November, MD as Consulting Physician (Cardiology)  Indicate any recent Medical Services you may have received from other than [facility] providers in the past year (date may be approximate).     Assessment:   This is a routine wellness examination for [facility]. The goal of the wellness visit is to assist the patient how to close the gaps in care and create a preventative care plan for the patient.   Taking calcium VIT D as appropriate/Osteoporosis risk reviewed.  Medications reviewed; taking without issues or barriers.  Safety issues reviewed; smoke detectors in the home. No firearms in the home.  Wears seatbelts when driving or riding with others. Patient does wear sunscreen or protective clothing when in direct sunlight. No violence in the home.  Depression- PHQ 2 &9 complete.  No signs/symptoms or verbal communication regarding little pleasure in doing things, feeling down, depressed or hopeless. No changes in sleeping, energy, eating, concentrating.  No thoughts of self harm or harm towards others.  Time spent on this topic is 8 minutes.   Patient is alert, normal appearance, oriented to person/place/and time. Correctly identified the president of the [place], recall of 3/3 words, and performing simple calculations.  Patient displays appropriate judgement and can read correct time from watch face.  No new identified risk were noted.  No failures at ADL's or IADL's.   BMI- discussed the importance of a healthy diet, water intake and exercise. Educational material provided.   24 hour diet recall: Breakfast: Cereal Lunch: Cheeseburger, fry  Dinner: Meat, fruit Daily fluid intake: 1 cup of caffeine, 2 cups of water  HTN- followed by PCP.  Dental- every six months.   Sleep patterns- Sleeps 4-6 hours at night.  Wakes feeling rested  TDAP vaccine deferred per patient preference.  Follow up with  insurance.  Educational material provided.  Colonoscopy/Cologuard discussed.  Deferred per patient request.  Educational material provided.  Mammogram completed 08/19/16.  DEXA scan discussed, deferred per patient request. Educational material provided.  Patient Concerns: None at this time. Follow up with PCP as needed.  Hearing/Vision screen Hearing Screening Comments: Patient is able to hear conversational tones without difficulty.  No issues reported.   Vision Screening Comments: Followed by [facility] Last OV 01/2016 Cataract extraction, bilateral Visual acuity not assessed per patient preference since they have regular follow up with the ophthalmologist  Dietary issues and exercise activities discussed:    Goals    . Increase physical activity          Stay active, walk for exercise as tolerated.    . Increase water intake  Stay hydrated    . Low carb foods      Depression Screen PHQ 2/9 Scores 08/24/2016 07/08/2016 01/28/2015  PHQ - 2 Score 0 0 0    Fall Risk Fall Risk  08/24/2016 07/08/2016 01/28/2015  Falls in the past year? No No No    Cognitive Function:     6CIT Screen 08/24/2016  What Year? 0 points  What month? 0 points  What time? 0 points  Count back from 20 0 points  Months in reverse 0 points  Repeat phrase 0 points  Total Score 0    Screening Tests Health Maintenance  Topic Date Due  . TETANUS/TDAP  03/12/1964  . COLONOSCOPY  03/13/1995  . DEXA SCAN  03/12/2010  . INFLUENZA VACCINE  10/26/2016  . PNA vac Low Risk Adult (2 of 2 - PPSV23) 07/14/2017  . MAMMOGRAM  08/20/2018  . Hepatitis C Screening  Completed      Plan:    End of life planning; Advance aging; Advanced directives discussed. Copy of current HCPOA/Living Will requested.    I have personally reviewed and noted the following in the patient's chart:   . Medical and social history . Use of alcohol, tobacco or illicit drugs  . Current medications and  supplements . Functional ability and status . Nutritional status . Physical activity . Advanced directives . List of other physicians . Hospitalizations, surgeries, and ER visits in previous 12 months . Vitals . Screenings to include cognitive, depression, and falls . Referrals and appointments  In addition, I have reviewed and discussed with patient certain preventive protocols, quality metrics, and best practice recommendations. A written personalized care plan for preventive services as well as general preventive health recommendations were provided to patient.     Varney Biles, LPN   [id number]

## 2016-08-24 NOTE — Patient Instructions (Addendum)
Dawn Herring , Thank you for taking time to come for your Medicare Wellness Visit. I appreciate your ongoing commitment to your health goals. Please review the following plan we discussed and let me know if I can assist you in the future.   Follow up with Dr. Caryl Bis as needed.    Bring a copy of your [place] and/or Living Will to be scanned into chart.  Have a great day!  These are the goals we discussed: Goals    . Increase physical activity          Stay active, walk for exercise as tolerated.    . Increase water intake          Stay hydrated    . Low carb foods       This is a list of the screening recommended for you and due dates:  Health Maintenance  Topic Date Due  . Tetanus Vaccine  03/12/1964  . Colon Cancer Screening  03/13/1995  . DEXA scan (bone density measurement)  03/12/2010  . Flu Shot  10/26/2016  . Pneumonia vaccines (2 of 2 - PPSV23) 07/14/2017  . Mammogram  08/20/2018  .  Hepatitis C: One time screening is recommended by Center for Disease Control  (CDC) for  adults born from [birth year] through 1966.   Completed    Bone Densitometry Bone densitometry is an imaging test that uses a special X-ray to measure the amount of calcium and other minerals in your bones (bone density). This test is also known as a bone mineral density test or dual-energy X-ray absorptiometry (DXA). The test can measure bone density at your hip and your spine. It is similar to having a regular X-ray. You may have this test to:  Diagnose a condition that causes weak or thin bones (osteoporosis).  Predict your risk of a broken bone (fracture).  Determine how well osteoporosis treatment is working. Tell a health care provider about:  Any allergies you have.  All medicines you are taking, including vitamins, herbs, eye drops, creams, and over-the-counter medicines.  Any problems you or family members have had with anesthetic medicines.  Any blood disorders  you have.  Any surgeries you have had.  Any medical conditions you have.  Possibility of pregnancy.  Any other medical test you had within the previous 14 days that used contrast material. What are the risks? Generally, this is a safe procedure. However, problems can occur and may include the following:  This test exposes you to a very small amount of radiation.  The risks of radiation exposure may be greater to unborn children. What happens before the procedure?  Do not take any calcium supplements for 24 hours before having the test. You can otherwise eat and drink what you usually do.  Take off all metal jewelry, eyeglasses, dental appliances, and any other metal objects. What happens during the procedure?  You may lie on an exam table. There will be an X-ray generator below you and an imaging device above you.  Other devices, such as boxes or braces, may be used to position your body properly for the scan.  You will need to lie still while the machine slowly scans your body.  The images will show up on a computer monitor. What happens after the procedure? You may need more testing at a later time. This information is not intended to replace advice given to you by your health care provider. Make sure you discuss any questions  you have with your health care provider. Document Released: 04/05/2004 Document Revised: 08/20/2015 Document Reviewed: 08/22/2013 Elsevier Interactive Patient Education  2017 [employer].

## 2016-09-03 ENCOUNTER — Other Ambulatory Visit: Payer: Self-pay | Admitting: Cardiovascular Disease

## 2016-09-14 ENCOUNTER — Telehealth: Payer: Self-pay | Admitting: Cardiovascular Disease

## 2016-09-14 NOTE — Telephone Encounter (Signed)
Spoke w/ pt.  Advised her that I am leaving samples in the lab fridge in a bag w/ her name on it.  She is appreciative and will try to get by to p/u this afternoon or Friday.

## 2016-09-14 NOTE — Telephone Encounter (Signed)
Pt states she needs 1 Praluent injection for this week.

## 2016-10-02 NOTE — Progress Notes (Signed)
Cardiology Office Note  Date:  10/04/2016   ID:  Dawn, Herring [birth year], MRN [id number]  PCP:  Dawn Haven, MD   Chief Complaint  Patient presents with  . other    6 month f/u c/o sob, shoulder blade pain and edema ankles. Meds reviewed verbally with pt.    HPI:  [name] is a very pleasant 71 year old woman with past medical history of hypertension,  familial hypercholesterolemia,  chest pain and atrial tachycardia,  previous stress test in 2005, also in 2008 or 2009  Prior CT coronary calcium score  190,  started on more aggressive cholesterol regiment at that time PVCs seen on prior EKGs Event monitor December 2016 showing APCs, runs of atrial tachycardia Who presents for routine followup of her hyperlipidemia hypertension, and arrhythmia  In follow-up today she reports having some back and left rib pain underneath the left breast Possibly associated with tachycardia episodes She does remember picking up flower pots several months ago, may have hurt her back  She is taking propranolol 5 mg twice a day All of her atrial tachycardia episodes percent overnight, not in the day Takes magnesium and potassium  Reports that she had a severe episode in April  Tachycardia  lasted a few hours, very symptomatic  On her last clinic visit reported having Pain between shoulder blades at times, takes aleve  On praluent samples from our office Unable to afford the medication, was costing more than $5000 per year  Father with dementia Lost mother in the 59s, Lost brother from heart disease  EKG personally reviewed by myself on today's visit shows normal sinus rhythm with rate 48 bpm,   Other past medical history reviewed Cataract surgery: sept 26th, 2017 Second cataract surgery  last Thursday, anesthesia noted irregular rhythm She did not notice any symptoms Reports that she has not felt well since that time, feels weak  Off pralunt, changed to repatha,  She  received samples from the office, did not the to fill her prescription despite aggressive marketing from [facility] pharmacy. Was told that they would cancel her prescription. She was paying $600 per month and reports that she was able to afford this. Prescription now canceled? Has been without medication for several months  Previous 30 day monitor again reviewed with her showing runs of atrial tachycardia, run of SVT, APCs, PVCs  Previously had significant myalgias on the fluvastatin  Symptoms improved by holding the medication Previously had gallbladder attack  and in the setting of severe pain, had tachycardia concerning for arrhythmia  Prior episode of tachycardia concerning for arrhythmia in the setting of back pain in the past  Strong family history of familial hypercholesterolemia.  She has tried numerous cholesterol medications in the past including Crestor, Lipitor, lovastatin. She did not tolerate these secondary to side effects.    PMH:   has a past medical history of Chest pain; Essential hypertension; Hiatal hernia; History of DVT (deep vein thrombosis); Hyperlipidemia; and Palpitations.  PSH:    Past Surgical History:  Procedure Laterality Date  . CATARACT EXTRACTION, BILATERAL    . LAPAROSCOPIC APPENDECTOMY N/A 12/02/2015   Procedure: APPENDECTOMY LAPAROSCOPIC;  Surgeon: Clovis Riley, MD;  Location: [facility];  Service: General;  Laterality: N/A;  . ROOT CANAL  2013  . ROOT CANAL    . VAGINAL HYSTERECTOMY     abdominal hysterectomy    Current Outpatient Prescriptions  Medication Sig Dispense Refill  . Alirocumab (PRALUENT) 75 MG/ML SOPN Inject 1 pen  into the skin every 14 (fourteen) days.    Marland Kitchen aspirin 81 MG tablet Take 162 mg by mouth daily.    . colchicine 0.6 MG tablet Take 1 tablet (0.6 mg total) by mouth daily. As needed for gout flare 30 tablet 0  . ezetimibe (ZETIA) 10 MG tablet TAKE 1 TABLET BY MOUTH EVERY DAY 90 tablet 2  . Omega-3 Fatty Acids (FISH OIL)  1000 MG CAPS Take by mouth.    Marland Kitchen omeprazole (PRILOSEC) 20 MG capsule Take 20 mg by mouth daily.      . propranolol (INDERAL) 10 MG tablet Take 1 tablet (10 mg total) by mouth 3 (three) times daily as needed (palpitations). (Patient taking differently: Take 10 mg by mouth daily. ) 90 tablet 3  . valsartan-hydrochlorothiazide (DIOVAN-HCT) 160-25 MG tablet TAKE 1 TABLET BY MOUTH EVERY DAY 90 tablet 3   No current facility-administered medications for this visit.      Allergies:   Statins   Social History:  The patient  reports that she has never smoked. She has never used smokeless tobacco. She reports that she does not drink alcohol or use drugs.   Family History:   family history includes Heart disease in her father and mother; Thyroid cancer in her brother.    Review of Systems: Review of Systems  Constitutional: Negative.   Respiratory: Negative.   Cardiovascular: Positive for chest pain and palpitations.  Gastrointestinal: Negative.   Musculoskeletal: Negative.   Neurological: Negative.   All other systems reviewed and are negative.    PHYSICAL EXAM: VS:  BP 110/64 (BP Location: Left Arm, Patient Position: Sitting, Cuff Size: Normal)   Pulse (!) 48   Ht 5' (1.524 m)   Wt 190 lb (86.2 kg)   BMI 37.11 kg/m  , BMI Body mass index is 37.11 kg/m. GEN: Well nourished, well developed, in no acute distress , obese HEENT: normal  Neck: no JVD, carotid bruits, or masses Cardiac: RRR; no murmurs, rubs, or gallops,no edema  Respiratory:  clear to auscultation bilaterally, normal work of breathing GI: soft, nontender, nondistended, + BS MS: no deformity or atrophy  Skin: warm and dry, no rash Neuro:  Strength and sensation are intact Psych: euthymic mood, full affect    Recent Labs: 12/02/2015: BUN 13; Creatinine, Ser 1.03; Hemoglobin 14.4; Platelets 255; Potassium 3.9; Sodium 139 04/28/2016: ALT 14    Lipid Panel Lab Results  Component Value Date   CHOL 187 04/28/2016    HDL 53 04/28/2016   LDLCALC 71 04/28/2016   TRIG 313 (H) 04/28/2016      Wt Readings from Last 3 Encounters:  10/04/16 190 lb (86.2 kg)  08/24/16 191 lb 12.8 oz (87 kg)  07/14/16 191 lb (86.6 kg)       ASSESSMENT AND PLAN:  Pure hypercholesterolemia - Plan: EKG 12-Lead Recommended she stay on her praluent Samples provided Last total cholesterol 180  Coronary artery disease involving native coronary artery of native heart without angina pectoris - Plan: EKG 12-Lead Atypical chest pain symptoms,  Possibly associated with her tachycardia Previous CT coronary calcium score 190  Cardiac arrhythmia, unspecified cardiac arrhythmia type - Plan: EKG 12-Lead Long discussion concerning her arrhythmia Episodes of atrial tachycardia Recommended she take propranolol 10 mg at nighttime, stop morning propranolol as she does not have any arrhythmia during the daytime  Essential hypertension We have encouraged continued exercise, careful diet management in an effort to lose weight.   Total encounter time more than 25 minutes  Greater than 50% was spent in counseling and coordination of care with the patient   Disposition:   F/U  12 months   No orders of the defined types were placed in this encounter.    Signed, Esmond Plants, M.D., Ph.D. 10/04/2016  [place], [place]

## 2016-10-04 ENCOUNTER — Ambulatory Visit (INDEPENDENT_AMBULATORY_CARE_PROVIDER_SITE_OTHER): Payer: Medicare Other | Admitting: Cardiovascular Disease

## 2016-10-04 ENCOUNTER — Encounter: Payer: Self-pay | Admitting: Cardiovascular Disease

## 2016-10-04 VITALS — BP 110/64 | HR 48 | Ht 60.0 in | Wt 190.0 lb

## 2016-10-04 DIAGNOSIS — I2510 Atherosclerotic heart disease of native coronary artery without angina pectoris: Secondary | ICD-10-CM

## 2016-10-04 DIAGNOSIS — R002 Palpitations: Secondary | ICD-10-CM

## 2016-10-04 DIAGNOSIS — I10 Essential (primary) hypertension: Secondary | ICD-10-CM

## 2016-10-04 DIAGNOSIS — E782 Mixed hyperlipidemia: Secondary | ICD-10-CM | POA: Diagnosis not present

## 2016-10-04 DIAGNOSIS — I471 Supraventricular tachycardia: Secondary | ICD-10-CM | POA: Diagnosis not present

## 2016-10-04 MED ORDER — FLECAINIDE ACETATE 50 MG PO TABS: 50.0000 mg | ORAL_TABLET | Freq: Two times a day (BID) | ORAL | 6 refills | Status: DC | PRN

## 2016-10-04 NOTE — Patient Instructions (Addendum)
Medication Instructions:   Take a whole propranolol at night, 10 mg Hold the AM pill  Please take flecainide as needed with repeat dose for atrial tachycardia  Labwork:  No new labs needed  Testing/Procedures:  No further testing at this time   Follow-Up: It was a pleasure seeing you in the office today. Please call us if you have new issues that need to be addressed before your next appt.  [phone]  Your physician wants you to follow-up in: 6 months.  You will receive a reminder letter in the mail two months in advance. If you don't receive a letter, please call our office to schedule the follow-up appointment.  If you need a refill on your cardiac medications before your next appointment, please call your pharmacy.  Medication Samples have been provided to the patient.  Drug name: Praluent       Strength: 75mg         Qty: 2 injections  LOT: [id number]  Exp.Date: 03/27/2017

## 2016-10-21 ENCOUNTER — Telehealth: Payer: Self-pay | Admitting: Cardiovascular Disease

## 2016-10-21 NOTE — Telephone Encounter (Signed)
Pt calling stating she received a notice from [facility] about her being Valsartan it is about the recall on the medication  She is asking for advise on this Please call back

## 2016-10-21 NOTE — Telephone Encounter (Signed)
Patient called regarding recall for her medication. She is currently on Valsartan-hydrochlorothiazide 160-25 mg once daily. Will route to Dr. Rockey Situ for his review and recommendation.

## 2016-10-21 NOTE — Telephone Encounter (Signed)
Please advise 

## 2016-10-23 NOTE — Telephone Encounter (Signed)
Losartan 50-HCTZ 25

## 2016-10-24 NOTE — Telephone Encounter (Signed)
I cannot find combination pill w/ this strength. Ok to separate into 2 pills?

## 2016-10-25 MED ORDER — LOSARTAN POTASSIUM-HCTZ 100-12.5 MG PO TABS: 1.0000 | ORAL_TABLET | Freq: Every day | ORAL | 6 refills | Status: DC

## 2016-10-25 NOTE — Telephone Encounter (Signed)
Would write for losartan HCTZ 100/12.5 mg daily Try whole pill but monitor pressure

## 2016-10-25 NOTE — Telephone Encounter (Signed)
Spoke w/ pt.   Advised her of Dr. Gollan's recommendation.  She is agreeable and will call back w/ any further questions or concerns.  

## 2016-11-29 ENCOUNTER — Telehealth: Payer: Self-pay | Admitting: Cardiovascular Disease

## 2016-11-29 NOTE — Telephone Encounter (Signed)
Pt calling asking about her Purulent injection   She is needing another shot done on Thursday  Please call back

## 2016-11-29 NOTE — Telephone Encounter (Signed)
Spoke with patient and she states that she needs her injections. She reports that we have been providing her with praluent for some time now due to insurance changes and the inability for her to pay for them. Let her know that I would place some samples in the frig for her to pick up and she was very appreciative for our help.   Medication Samples have been provided to the patient.  Drug name: Praluent       Strength: 75 mg/ml        Qty: 2 boxes  LOT: [id number]  Exp.Date: 11/25/2017

## 2016-12-06 ENCOUNTER — Telehealth: Payer: Self-pay | Admitting: Cardiovascular Disease
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
## 2016-12-06 NOTE — Telephone Encounter (Signed)
Called patient to see if she needs praluent samples.  She does and will come to office to pick these up this afternoon.

## 2017-01-13 ENCOUNTER — Ambulatory Visit (INDEPENDENT_AMBULATORY_CARE_PROVIDER_SITE_OTHER): Payer: Medicare Other | Admitting: Family Medicine

## 2017-01-13 ENCOUNTER — Encounter: Payer: Self-pay | Admitting: Family Medicine

## 2017-01-13 VITALS — BP 140/72 | HR 56 | Temp 98.0°F | Wt 196.2 lb

## 2017-01-13 DIAGNOSIS — L749 Eccrine sweat disorder, unspecified: Secondary | ICD-10-CM | POA: Diagnosis not present

## 2017-01-13 DIAGNOSIS — Z23 Encounter for immunization: Secondary | ICD-10-CM

## 2017-01-13 DIAGNOSIS — I2510 Atherosclerotic heart disease of native coronary artery without angina pectoris: Secondary | ICD-10-CM | POA: Diagnosis not present

## 2017-01-13 DIAGNOSIS — R208 Other disturbances of skin sensation: Secondary | ICD-10-CM | POA: Diagnosis not present

## 2017-01-13 DIAGNOSIS — I10 Essential (primary) hypertension: Secondary | ICD-10-CM | POA: Diagnosis not present

## 2017-01-13 DIAGNOSIS — G609 Hereditary and idiopathic neuropathy, unspecified: Secondary | ICD-10-CM | POA: Insufficient documentation

## 2017-01-13 DIAGNOSIS — E041 Nontoxic single thyroid nodule: Secondary | ICD-10-CM | POA: Diagnosis not present

## 2017-01-13 DIAGNOSIS — R7309 Other abnormal glucose: Secondary | ICD-10-CM

## 2017-01-13 DIAGNOSIS — G629 Polyneuropathy, unspecified: Secondary | ICD-10-CM | POA: Diagnosis not present

## 2017-01-13 DIAGNOSIS — K760 Fatty (change of) liver, not elsewhere classified: Secondary | ICD-10-CM

## 2017-01-13 DIAGNOSIS — M7989 Other specified soft tissue disorders: Secondary | ICD-10-CM

## 2017-01-13 DIAGNOSIS — R600 Localized edema: Secondary | ICD-10-CM | POA: Diagnosis not present

## 2017-01-13 DIAGNOSIS — I499 Cardiac arrhythmia, unspecified: Secondary | ICD-10-CM

## 2017-01-13 LAB — CBC WITH DIFFERENTIAL/PLATELET
BASOS ABS: 0 10*3/uL (ref 0.0–0.1)
BASOS PCT: 0.7 % (ref 0.0–3.0)
EOS PCT: 2.2 % (ref 0.0–5.0)
Eosinophils Absolute: 0.1 10*3/uL (ref 0.0–0.7)
HCT: 39.2 % (ref 36.0–46.0)
Hemoglobin: 13.2 g/dL (ref 12.0–15.0)
LYMPHS ABS: 1.7 10*3/uL (ref 0.7–4.0)
Lymphocytes Relative: 30.4 % (ref 12.0–46.0)
MCHC: 33.6 g/dL (ref 30.0–36.0)
MCV: 97.7 fl (ref 78.0–100.0)
MONO ABS: 0.5 10*3/uL (ref 0.1–1.0)
Monocytes Relative: 8.9 % (ref 3.0–12.0)
NEUTROS PCT: 57.8 % (ref 43.0–77.0)
Neutro Abs: 3.2 10*3/uL (ref 1.4–7.7)
PLATELETS: 262 10*3/uL (ref 150.0–400.0)
RBC: 4.01 Mil/uL (ref 3.87–5.11)
RDW: 12.6 % (ref 11.5–15.5)
WBC: 5.5 10*3/uL (ref 4.0–10.5)

## 2017-01-13 LAB — COMPREHENSIVE METABOLIC PANEL
ALT: 11 U/L (ref 0–35)
AST: 16 U/L (ref 0–37)
Albumin: 3.9 g/dL (ref 3.5–5.2)
Alkaline Phosphatase: 60 U/L (ref 39–117)
BUN: 18 mg/dL (ref 6–23)
CHLORIDE: 100 meq/L (ref 96–112)
CO2: 31 meq/L (ref 19–32)
CREATININE: 0.98 mg/dL (ref 0.40–1.20)
Calcium: 9.7 mg/dL (ref 8.4–10.5)
GFR: 59.32 mL/min — ABNORMAL LOW (ref >60.00)
GLUCOSE: 96 mg/dL (ref 70–99)
Potassium: 4.1 mEq/L (ref 3.5–5.1)
Sodium: 139 mEq/L (ref 135–145)
Total Bilirubin: 0.3 mg/dL (ref 0.2–1.2)
Total Protein: 7.3 g/dL (ref 6.0–8.3)

## 2017-01-13 LAB — HEMOGLOBIN A1C: HEMOGLOBIN A1C: 6.1 % (ref 4.6–6.5)

## 2017-01-13 LAB — TSH: TSH: 1.41 u[IU]/mL (ref 0.35–4.50)

## 2017-01-13 LAB — VITAMIN B12: Vitamin B-12: 479 pg/mL (ref 211–911)

## 2017-01-13 NOTE — Progress Notes (Signed)
Dawn Rumps, MD Phone: [phone]  Dawn Herring is a 71 y.o. female who presents today for follow-up.  Patient is followed by cardiology for PACs and PVCs. Also her blood pressure. She's on propranolol once daily. She notes every now and then she will have symptomatic tachycardia with a little bit of chest pain and shortness of breath. She states she was prescribed flecainide to take when this occurs. She'll take 1 dose and this will help her symptoms significantly. She notes no exertional chest pain or shortness of breath.  She reports she feels like she has had an increase in sweating and edema after being switched to losartan/HCTZ. Notes her blood pressure is also slightly higher than prior. She notes she really only sweats when she gets out of the shower. Occasionally she'll have a tiny bit of sweating at night though no drenching sweats. No weight loss. She thinks the sweats are related to the losartan. No exertional symptoms.  She has had slight increased swelling in her feet since her HCTZ dose was decreased. She also notes some neuropathy pain in her feet as well over that time. Describes it as a burning sensation on the bottom of her feet.  The patient with prior ultrasound showing fatty liver. She does report a history of hepatitis when she was younger.  Has a history of multinodular thyroid. Evaluated by ENT, Dr Constance Holster. Status post biopsy close to 2 years ago with benign findings. She notes no follow-up scheduled with ENT.  PMH: nonsmoker.   ROS see history of present illness  Objective  Physical Exam Vitals:   01/13/17 1039  BP: 140/72  Pulse: (!) 56  Temp: 98 F (36.7 C)  SpO2: 95%    BP Readings from Last 3 Encounters:  01/13/17 140/72  10/04/16 110/64  08/24/16 118/74   Wt Readings from Last 3 Encounters:  01/13/17 196 lb 3.2 oz (89 kg)  10/04/16 190 lb (86.2 kg)  08/24/16 191 lb 12.8 oz (87 kg)    Physical Exam  Constitutional: No distress.    Cardiovascular: Normal rate, regular rhythm and normal heart sounds.   Pulmonary/Chest: Effort normal and breath sounds normal.  Abdominal: Soft. Bowel sounds are normal. She exhibits no distension. There is no tenderness. There is no rebound and no guarding.  Neurological: She is alert. Gait normal.  Skin: Skin is warm and dry. She is not diaphoretic.     Assessment/Plan: Please see individual problem list.  Arrhythmia Follows with cardiology. Taking propranolol once daily. Also taking flecainide as needed. Notes rare symptoms recently. She'll monitor.  Essential hypertension Slightly increased though still relatively well controlled for age. We'll check lab work and determine if we can increase her HCTZ  Non-alcoholic fatty liver disease Refer to GI to consider further evaluation. Prior workup revealing prior history of hepatitis A.  Thyroid nodule History of thyroid nodules. Evaluated by ENT. Dr. Constance Holster. Prior biopsy benign. Patient notes she was not advised whether or not she needed follow-up on this. We will request records from Dr. Janeice Robinson office. On review of up-to-date and does appear that it is recommended to have follow-up ultrasound at 12-24 months. She is currently just shy of 24 months. I will have Dawn Herring contact the patient to see if she wants to proceed with ultrasound prior to [place] getting records from Dr. Janeice Robinson office.  Foot swelling Bilateral swelling. Likely contributed to by the decreased HCTZ dose. We'll check lab work to look for other causes.  Neuropathy Foot symptoms most  consistent with neuropathy. We'll check A1c and B12.   Orders Placed This Encounter  Procedures  . Flu vaccine HIGH DOSE PF  . HgB A1c  . B12  . Comp Met (CMET)  . TSH  . CBC w/Diff  . Ambulatory referral to Gastroenterology    Referral Priority:   Routine    Referral Type:   Consultation    Referral Reason:   Specialty Services Required    Number of Visits Requested:   [place], MD [place]

## 2017-01-13 NOTE — Assessment & Plan Note (Signed)
Refer to GI to consider further evaluation. Prior workup revealing prior history of hepatitis A.

## 2017-01-13 NOTE — Patient Instructions (Signed)
Nice to see you. We'll get some lab work today and contacted with the results. We'll send a message to her cardiologist as well. We will future referred to GI as well for your liver. If you develop chest pain or shortness of breath please be evaluated.

## 2017-01-13 NOTE — Assessment & Plan Note (Signed)
Bilateral swelling. Likely contributed to by the decreased HCTZ dose. We'll check lab work to look for other causes.

## 2017-01-13 NOTE — Assessment & Plan Note (Addendum)
History of thyroid nodules. Evaluated by ENT. Dr. Constance Holster. Prior biopsy benign. Patient notes she was not advised whether or not she needed follow-up on this. We will request records from Dr. Janeice Robinson office. On review of up-to-date and does appear that it is recommended to have follow-up ultrasound at 12-24 months. She is currently just shy of 24 months. I will have Janett Billow contact the patient to see if she wants to proceed with ultrasound prior to [place] getting records from Dr. Janeice Robinson office.

## 2017-01-13 NOTE — Assessment & Plan Note (Signed)
Foot symptoms most consistent with neuropathy. We'll check A1c and B12.

## 2017-01-13 NOTE — Assessment & Plan Note (Signed)
Slightly increased though still relatively well controlled for age. We'll check lab work and determine if we can increase her HCTZ

## 2017-01-13 NOTE — Assessment & Plan Note (Signed)
Follows with cardiology. Taking propranolol once daily. Also taking flecainide as needed. Notes rare symptoms recently. She'll monitor.

## 2017-01-13 NOTE — Progress Notes (Signed)
Patient states she would like to go ahead and have the ultrasound done

## 2017-01-14 ENCOUNTER — Telehealth: Payer: Self-pay | Admitting: Family Medicine

## 2017-01-14 ENCOUNTER — Other Ambulatory Visit: Payer: Self-pay | Admitting: Family Medicine

## 2017-01-14 DIAGNOSIS — E042 Nontoxic multinodular goiter: Secondary | ICD-10-CM

## 2017-01-14 NOTE — Telephone Encounter (Signed)
Please let the patient know that I heard back from her cardiologist. He noted he did not usually see sweating with losartan. He discussed getting her on a regular exercise program for conditioning and possibly decreasing the temperature of her water when she takes a shower.

## 2017-01-14 NOTE — Telephone Encounter (Signed)
-----   Message from Minna Merritts, MD sent at 01/13/2017  6:53 PM EDT ----- I usually dont usually see sweating with losartan Perhaps if the water is too hot or bathroom not ventilated well enough I have a few people who overexert themselves in the shower and take a while to cool down We probably need to get her on a regular exercise program for conditioning Get the weight down thx TG  ----- Message ----- From: Leone Haven, MD Sent: 01/13/2017  11:32 AM To: Minna Merritts, MD  Hi Dr Rockey Situ,   I saw Mrs Masaki today for follow-up. She's noted increased sweating over the past month mostly after getting out of the shower. She is attributing this to her losartan. I discussed that I did not think this was likely and we are looking for other causes. She does not note any exertional symptoms, though does have some dyspnea and chest pain only when she experiences her tachycardia episodes which she states has responded well to the flecanide. I wanted to see if you have seen sweating issues with losartan previously so I can reassure her regarding this. Thanks for your help.  Randall Hiss

## 2017-01-16 NOTE — Telephone Encounter (Signed)
Patient notifed

## 2017-01-25 ENCOUNTER — Ambulatory Visit
Admission: RE | Admit: 2017-01-25 | Discharge: 2017-01-25 | Disposition: A | Discharge status: Home / Self Care | Payer: Medicare Other | Source: Ambulatory Visit | Attending: Family Medicine | Admitting: Family Medicine

## 2017-01-25 DIAGNOSIS — E042 Nontoxic multinodular goiter: Secondary | ICD-10-CM | POA: Diagnosis not present

## 2017-02-28 ENCOUNTER — Ambulatory Visit (INDEPENDENT_AMBULATORY_CARE_PROVIDER_SITE_OTHER): Payer: Medicare Other | Admitting: Gastroenterology

## 2017-02-28 ENCOUNTER — Encounter: Payer: Self-pay | Admitting: Gastroenterology

## 2017-02-28 VITALS — BP 143/72 | HR 59 | Temp 97.6°F | Ht 66.0 in | Wt 194.6 lb

## 2017-02-28 DIAGNOSIS — I2510 Atherosclerotic heart disease of native coronary artery without angina pectoris: Secondary | ICD-10-CM | POA: Diagnosis not present

## 2017-02-28 DIAGNOSIS — K760 Fatty (change of) liver, not elsewhere classified: Secondary | ICD-10-CM | POA: Diagnosis not present
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
## 2017-03-09 ENCOUNTER — Telehealth: Payer: Self-pay | Admitting: Cardiovascular Disease

## 2017-03-09 NOTE — Telephone Encounter (Signed)
Called patient and let her know we have some samples of Praluent. She will come by later today to get them  Medication Samples have been provided to the patient.  Drug name: Praluent       Strength: 75mg         Qty: 3  LOT: [id number]  Exp.Date: 07/25/2017

## 2017-03-09 NOTE — Telephone Encounter (Signed)
Pt needs Praluent samples. Pt states she is coming to [facility] today and would like to pick them up today.

## 2017-03-15 DIAGNOSIS — H26493 Other secondary cataract, bilateral: Secondary | ICD-10-CM | POA: Diagnosis not present

## 2017-03-15 DIAGNOSIS — H5213 Myopia, bilateral: Secondary | ICD-10-CM | POA: Diagnosis not present

## 2017-03-16 IMAGING — US US ABDOMEN COMPLETE
1 series · 13 of 25 positions shown · non-contrast
Comparison: None.

CLINICAL DATA: Right upper quadrant and epigastric pain for 3
weeks.

EXAM:
ULTRASOUND ABDOMEN COMPLETE

[Series 1: us abdomen complete · 0.20mm/px · 13 of 123 slices shown]
[im 1/123]
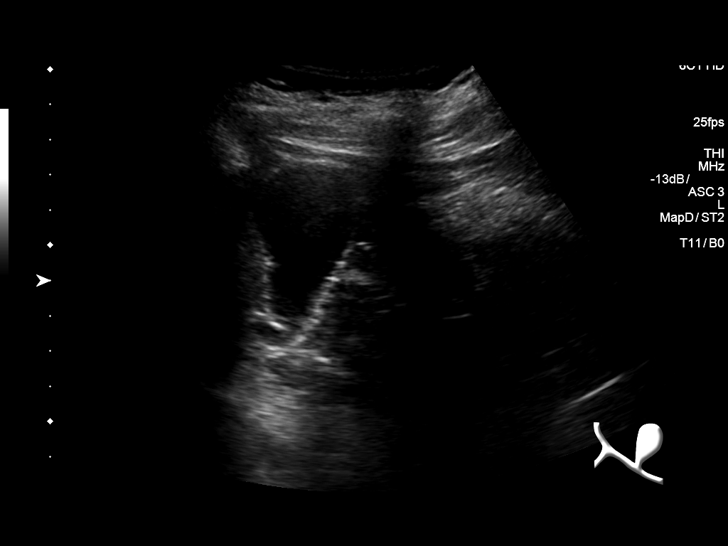
[im 11/123]
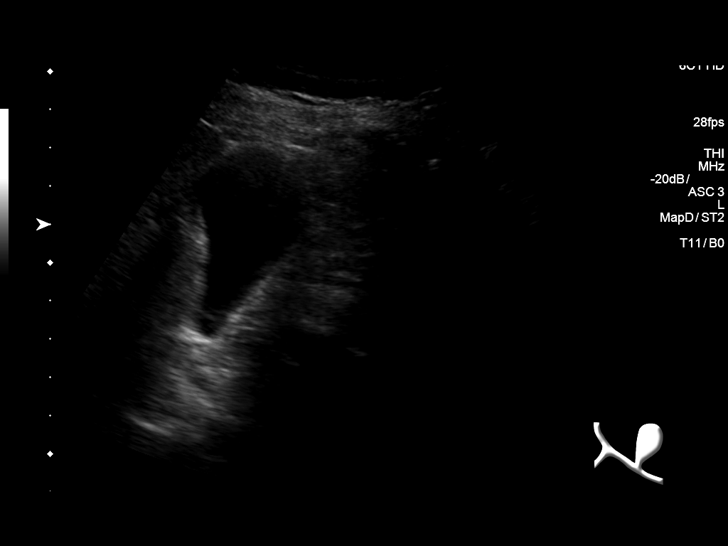
[im 21/123]
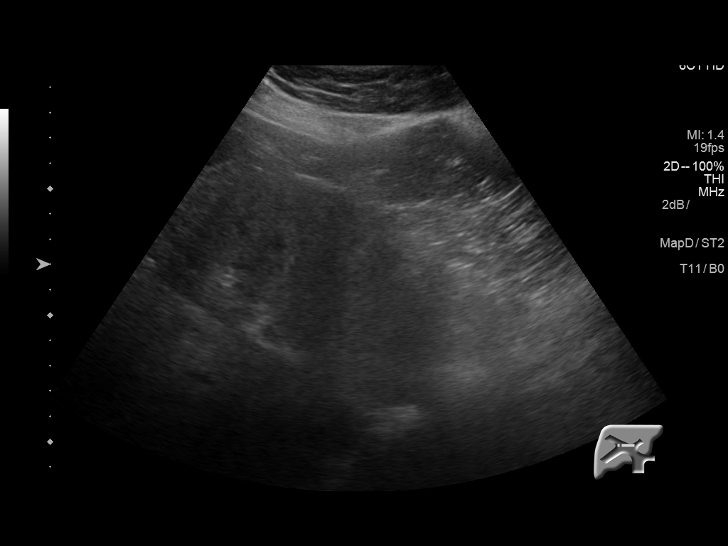
[im 31/123]
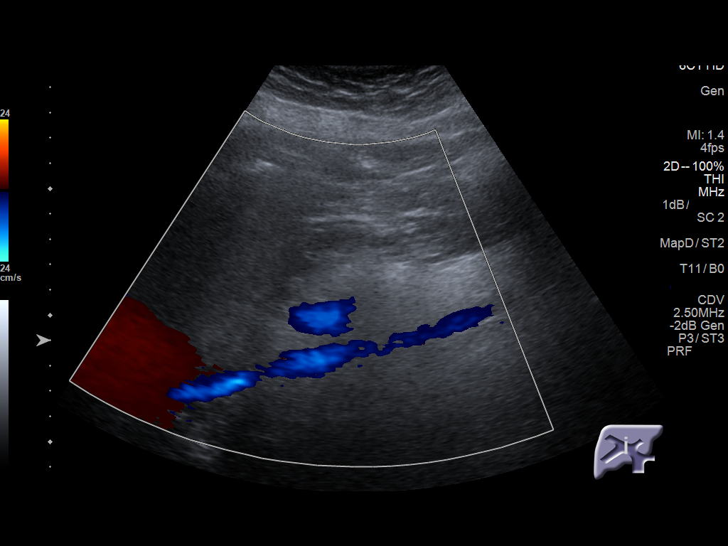
[im 41/123]
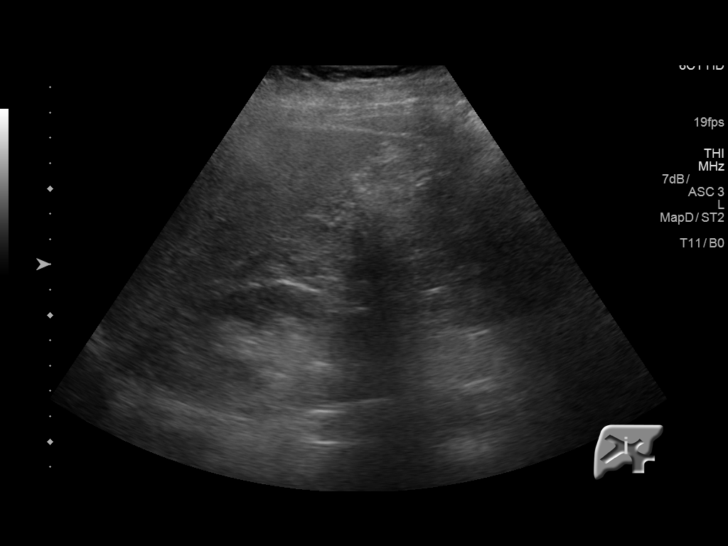
[im 51/123]
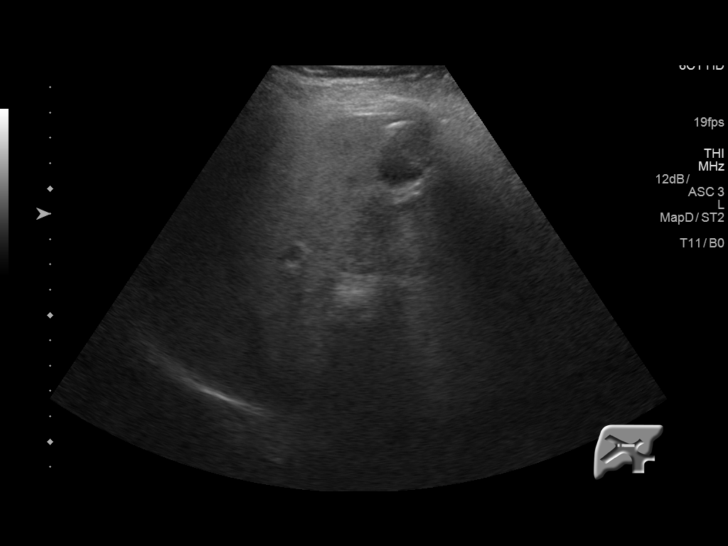
[im 62/123]
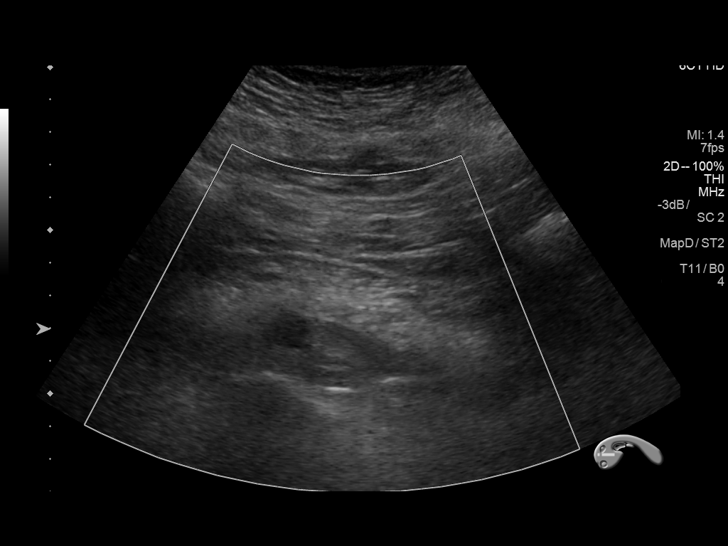
[im 72/123]
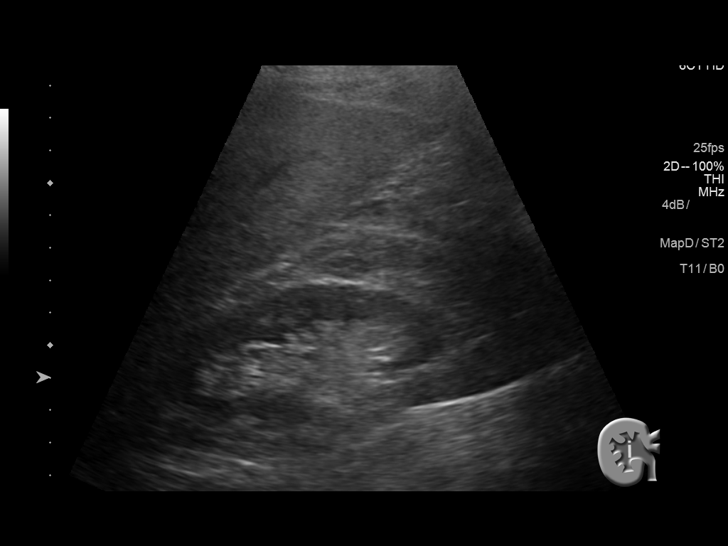
[im 82/123]
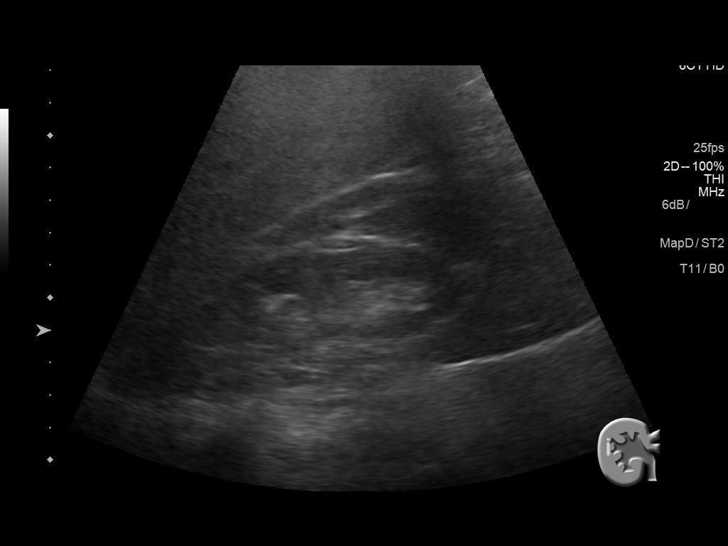
[im 92/123]
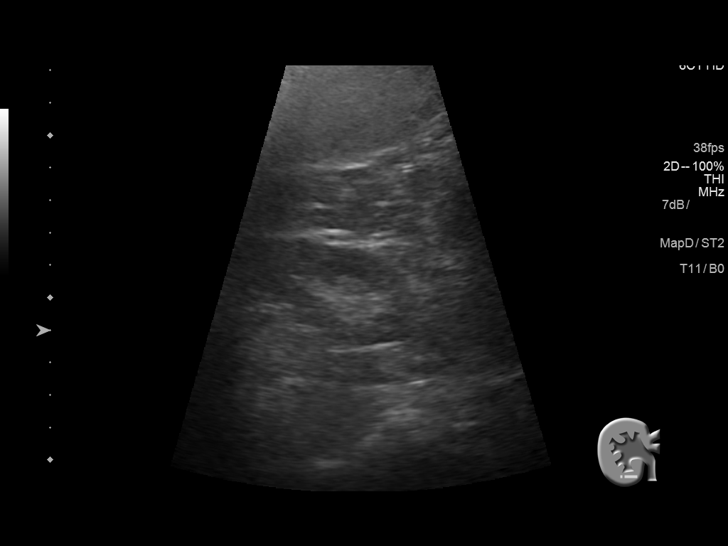
[im 102/123]
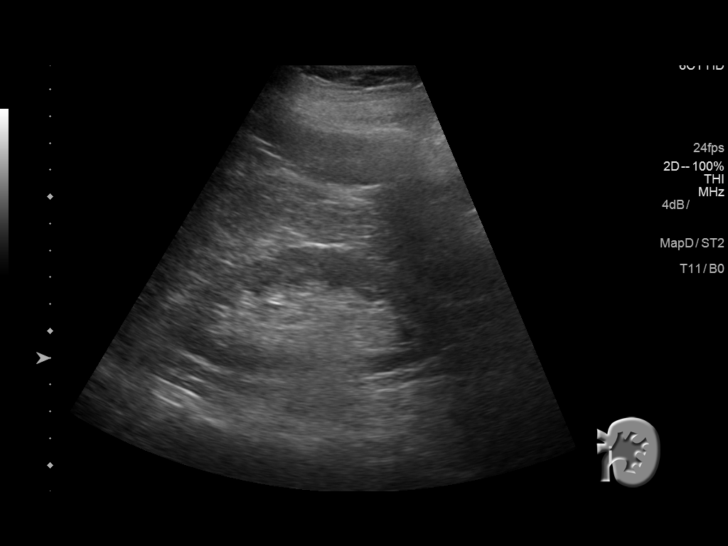
[im 112/123]
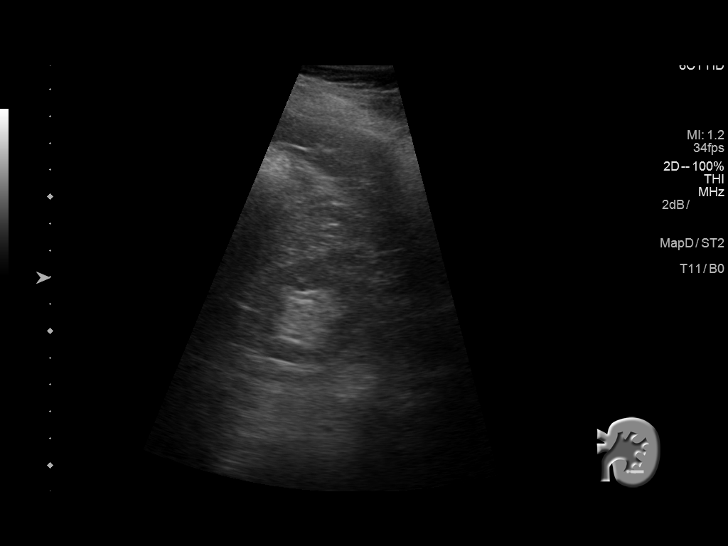
[im 123/123]
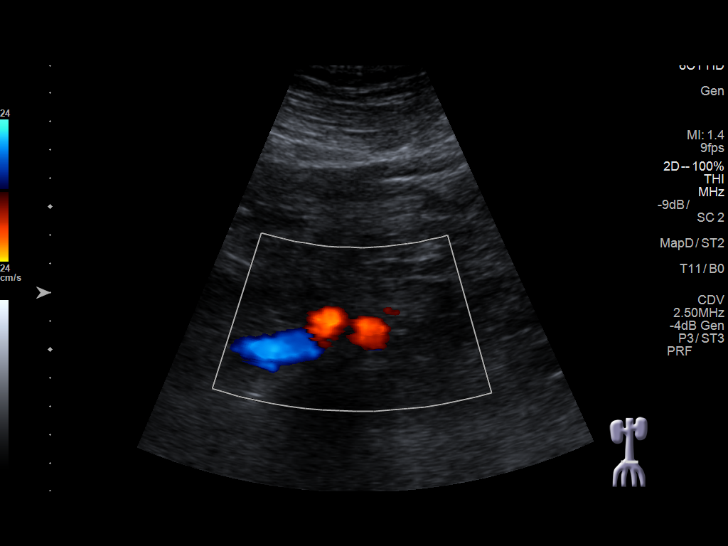

[13 of 25 positions shown; findings below may reference images not displayed]

FINDINGS: Gallbladder: No gallstones or wall thickening visualized. No
sonographic Murphy sign noted.

Common bile duct: Diameter: Normal at 6 mm diameter. No bile duct
stone identified.

Liver: Diffusely echogenic consistent with fatty infiltration. No
focal mass or lesion identified within the liver.

IVC: No abnormality visualized.

Pancreas: Visualized portion unremarkable although majority is
obscured by overlying bowel. No peripancreatic fluid seen.

Spleen: Size and appearance within normal limits.

Right Kidney: Length: 9.9 cm. Echogenicity within normal limits. No
mass or hydronephrosis visualized.

Left Kidney: Length: 10.7 cm. Echogenicity within normal limits. No
mass or hydronephrosis visualized.

Abdominal aorta: No aneurysm visualized. Portions of the abdominal
aorta obscured by overlying bowel.

Other findings: No free fluid identified within the abdomen.
IMPRESSION: 1. Fatty infiltration of the liver.
2. Pancreas and abdominal aorta not well seen due to patient body
habitus and obscuring bowel.
3. No acute findings. No evidence of cholecystitis. No bile duct
dilatation.

## 2017-03-16 IMAGING — US US SOFT TISSUE HEAD/NECK
1 series · 14 of 25 positions shown · non-contrast
Comparison: CT 12/03/2014

CLINICAL DATA: Thyroid nodule noted on CT chest, family history of
thyroid carcinoma

EXAM:
THYROID ULTRASOUND
TECHNIQUE: Ultrasound examination of the thyroid gland and adjacent soft
tissues was performed.

[Series 1: us soft tissue head/neck · 0.07mm/px · 14 of 101 slices shown]
[im 1/101]
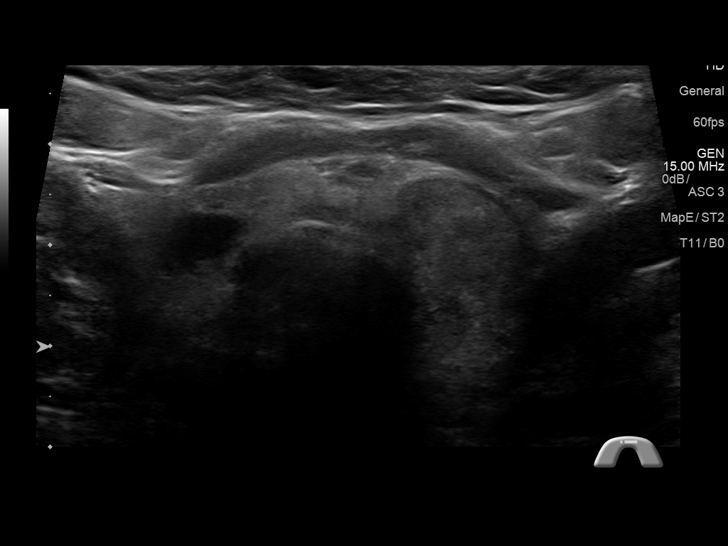
[im 9/101]
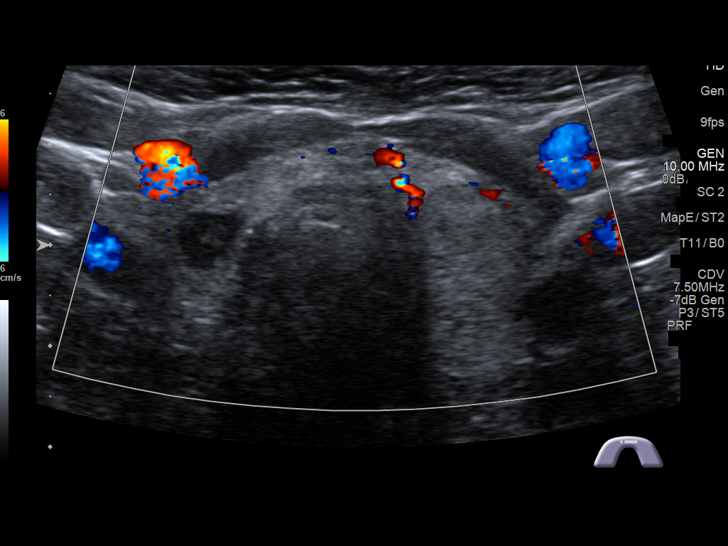
[im 17/101]
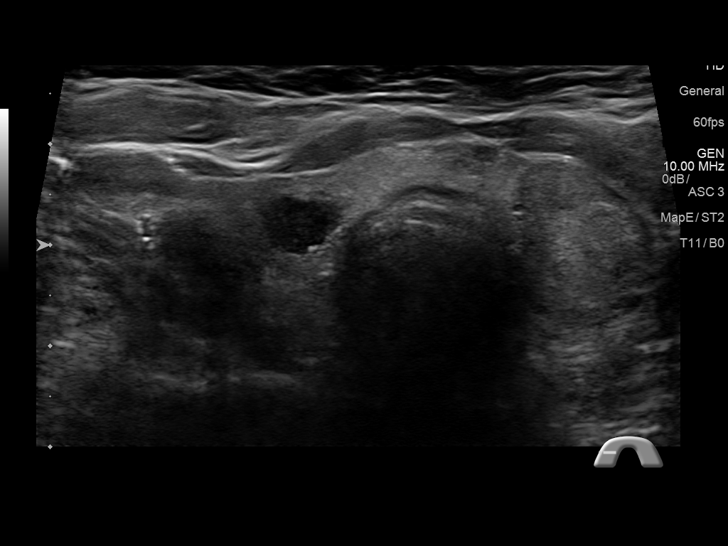
[im 26/101]
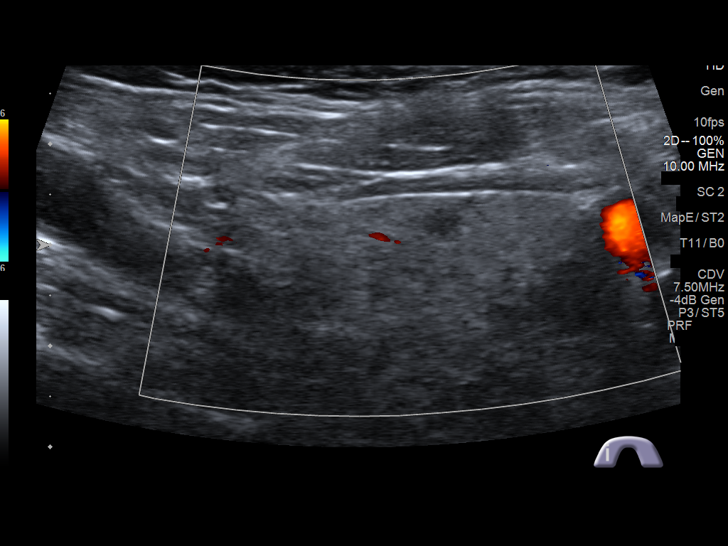
[im 34/101]
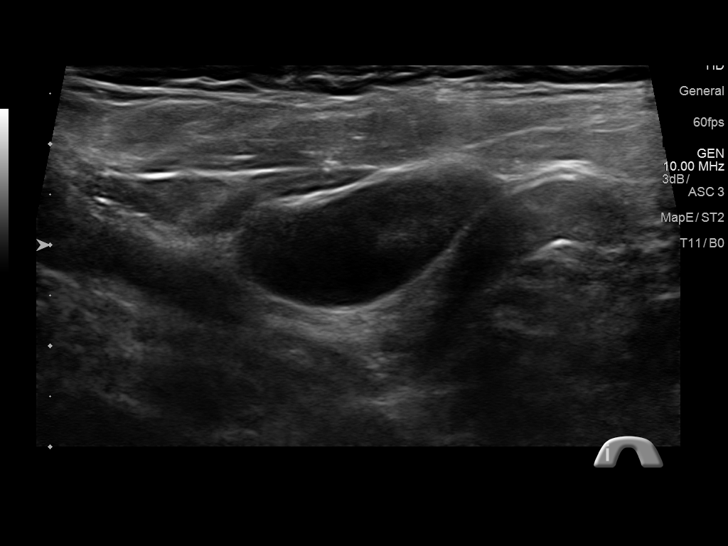
[im 38/101]
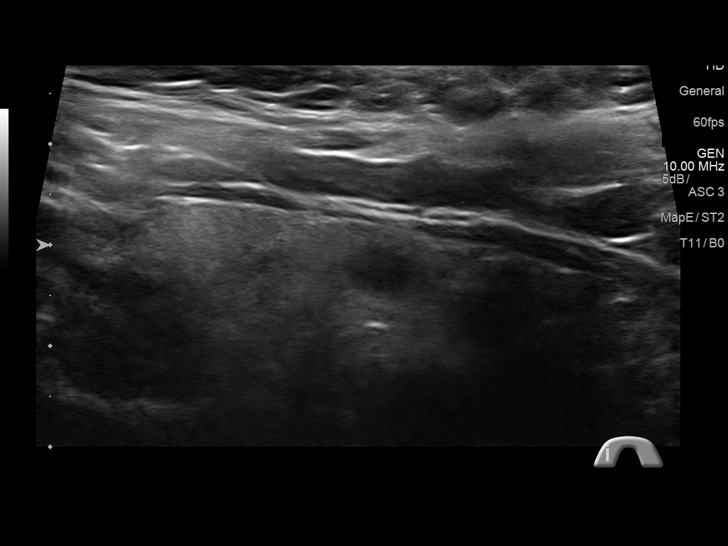
[im 46/101]
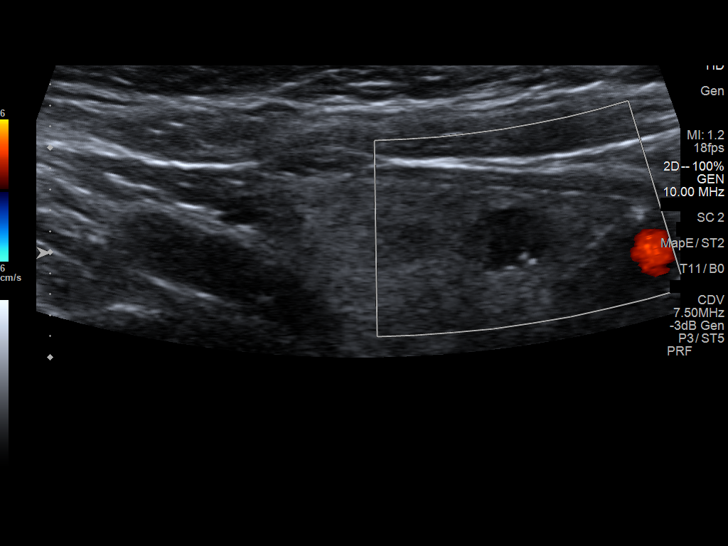
[im 55/101]
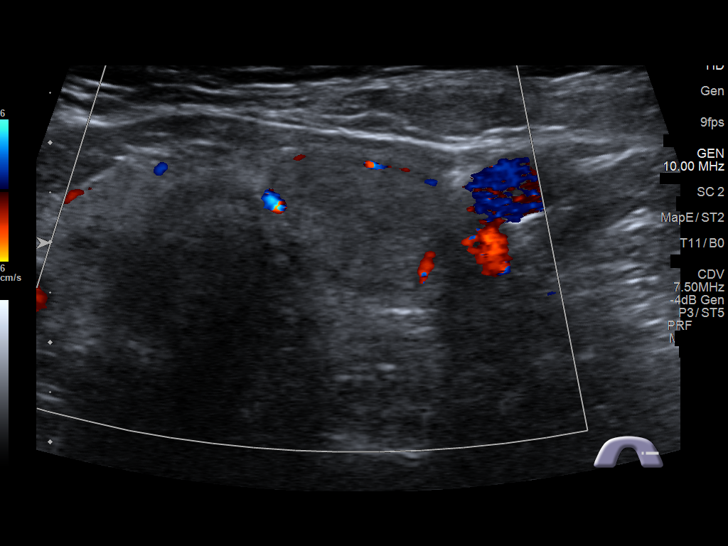
[im 63/101]
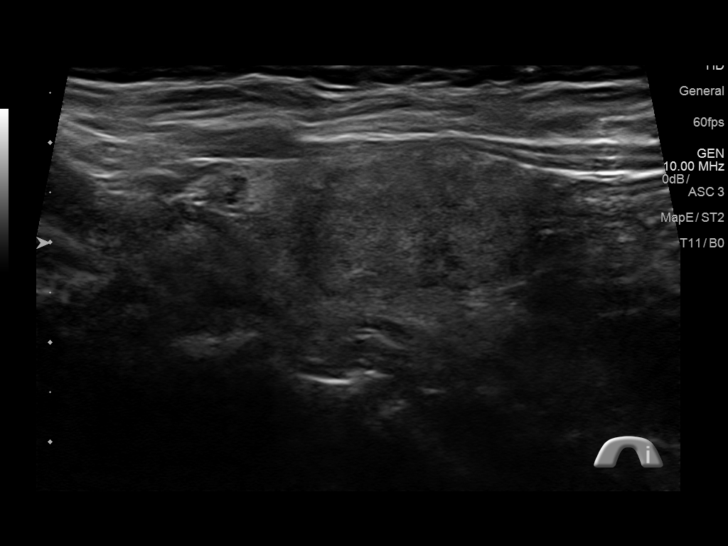
[im 67/101]
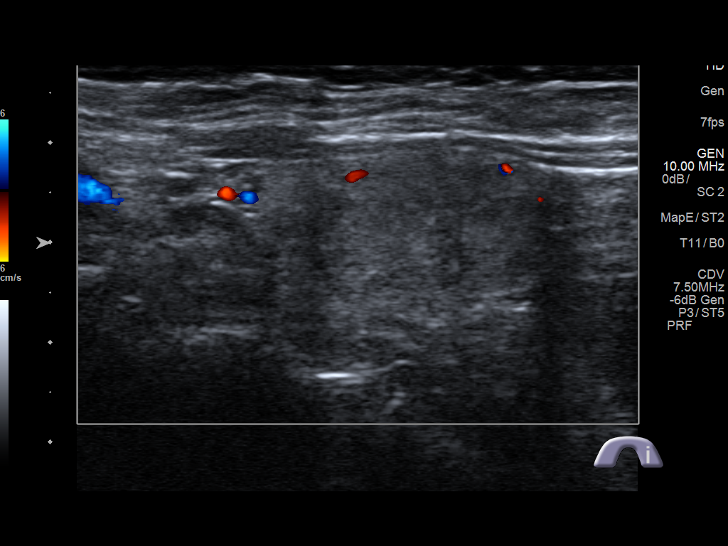
[im 76/101]
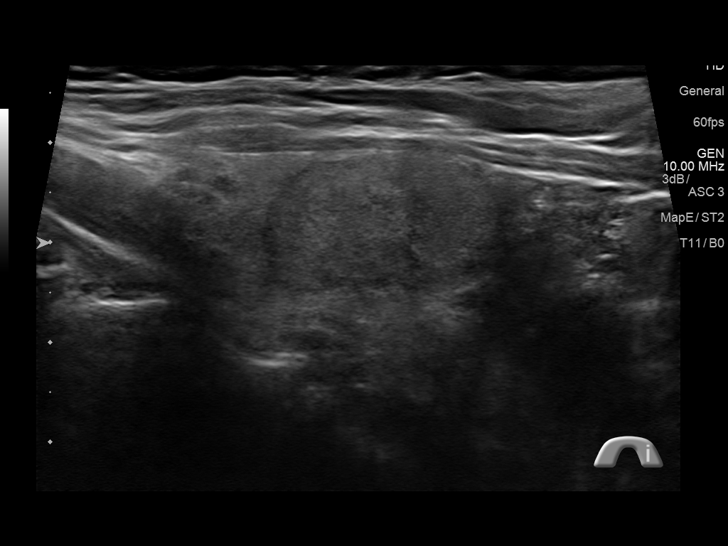
[im 84/101]
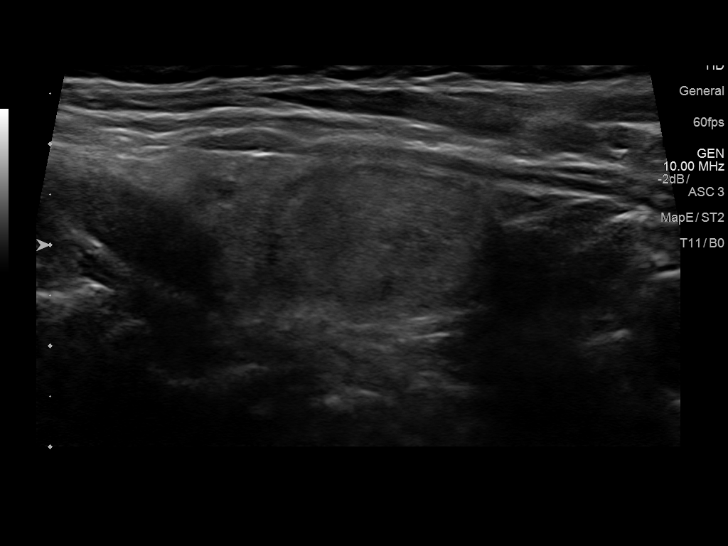
[im 92/101]
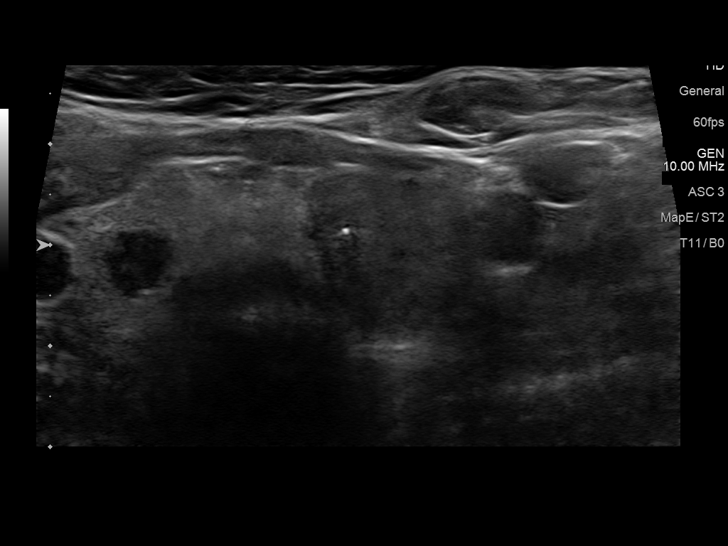
[im 101/101]
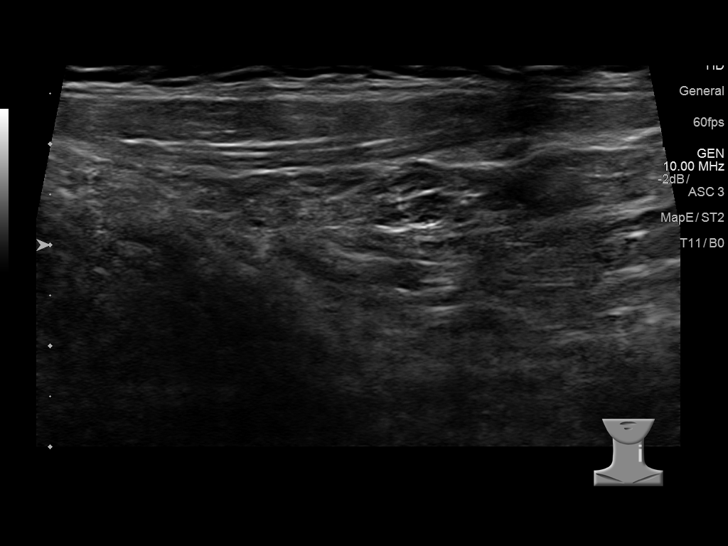

[14 of 25 positions shown; findings below may reference images not displayed]

FINDINGS: Right thyroid lobe

Measurements: 36 x 15 x 16 mm. Heterogeneous background echotexture.
10 x 9 x 7 mm complex cystic nodule, inferior pole.

Left thyroid lobe

Measurements: 38 x 19 x 16 mm. 15 x 23 x 18 mm solid nodule with
scattered internal calcifications, inferior pole

Isthmus

Thickness: 7 mm.  Inhomogeneous echotexture without discrete lesion

Lymphadenopathy

None visualized.
IMPRESSION: 1. Normal-sized thyroid with bilateral nodules. The dominant left
lesion meets consensus criteria for biopsy. Ultrasound-guided fine
needle aspiration should be considered, as per the consensus
statement: Management of Thyroid Nodules Detected at US: Society of
Radiologists in Ultrasound Consensus Conference Statement. Radiology

## 2017-03-29 ENCOUNTER — Telehealth: Payer: Self-pay | Admitting: Family Medicine

## 2017-03-29 ENCOUNTER — Other Ambulatory Visit: Payer: Self-pay | Admitting: Family Medicine

## 2017-03-29 ENCOUNTER — Other Ambulatory Visit (INDEPENDENT_AMBULATORY_CARE_PROVIDER_SITE_OTHER): Payer: Medicare Other

## 2017-03-29 DIAGNOSIS — R300 Dysuria: Secondary | ICD-10-CM | POA: Diagnosis not present

## 2017-03-29 LAB — URINALYSIS, ROUTINE W REFLEX MICROSCOPIC
BILIRUBIN URINE: NEGATIVE
KETONES UR: NEGATIVE
NITRITE: NEGATIVE
PH: 7 (ref 5.0–8.0)
Specific Gravity, Urine: 1.015 (ref 1.000–1.030)
Urine Glucose: NEGATIVE
Urobilinogen, UA: 0.2 (ref 0.0–1.0)

## 2017-03-29 MED ORDER — CEPHALEXIN 500 MG PO CAPS: 500.0000 mg | ORAL_CAPSULE | Freq: Two times a day (BID) | ORAL | 0 refills | Status: DC

## 2017-03-29 NOTE — Telephone Encounter (Signed)
Please make sure she is not having chills or fevers or abdominal pain. If she is having those she will need to be evaluated. If not having those she can provide a urine specimen in the office. If her symptoms worsen she needs to be evaluated. Thanks.

## 2017-03-29 NOTE — Telephone Encounter (Signed)
Please advise 

## 2017-03-29 NOTE — Telephone Encounter (Signed)
Patient has not had any of those symptoms.  She is coming in today for a urine specimen

## 2017-03-29 NOTE — Telephone Encounter (Signed)
CRM for notification. See Telephone encounter for:   03/29/17.  Relation to pt: self Call back number: [phone] Pharmacy: [id number] - [place], [place] - [address] [phone] (Phone) [phone] (Fax)  Reason for call:   Patient experiencing  since 03/23/17 Thursday slight burning,pain in lower back, odor, no bleeding, frequent urination, office has no availability, no surrounding sites have no availability. Patient states she lost her father over the holiday and would like urine orders placed only, unable to triage to [facility] nurse, please advise

## 2017-03-31 ENCOUNTER — Telehealth: Payer: Self-pay | Admitting: Family Medicine

## 2017-03-31 LAB — URINE CULTURE
MICRO NUMBER:: 90003349
SPECIMEN QUALITY: ADEQUATE

## 2017-03-31 MED ORDER — NITROFURANTOIN MONOHYD MACRO 100 MG PO CAPS: 100.0000 mg | ORAL_CAPSULE | Freq: Two times a day (BID) | ORAL | 0 refills | Status: DC

## 2017-03-31 NOTE — Telephone Encounter (Signed)
Patient regarding results.  She does appear to have a UTI.  The bacteria does not appear to be sensitive to the Keflex that she was started on.  She notes mild improvement on it though Given indeterminate sensitivity to Keflex we will start her on Macrobid.  This was sent to her pharmacy.  She will discontinue the Keflex.  She will let us know if she does not improve on the Macrobid.

## 2017-04-01 NOTE — Progress Notes (Signed)
Cardiology Office Note  Date:  04/03/2017   ID:  Dawn Herring, DOB 1945-11-08, MRN [id number]  PCP:  Leone Haven, MD   Chief Complaint  Patient presents with  . OTHER    6 month f/u c/o UTI/hot toe. Meds reviewed verbally with pt.    HPI:  Dawn Herring is a very pleasant 72 year old woman with past medical history of hypertension,  familial hypercholesterolemia,  chest pain and atrial tachycardia,  previous stress test in 2005, also in 2008 or 2009  Prior CT coronary calcium score  190,  started on more aggressive cholesterol regiment at that time PVCs seen on prior EKGs Event monitor December 2016 showing APCs, runs of atrial tachycardia Who presents for routine followup of her hyperlipidemia hypertension, and arrhythmia  Lost father, exhausted Heart wise, doing ok Insomnia, stress Gout attack right toe, started after she was on her feet all day for funeral She is taking propranolol 10 mg QHS All of her atrial tachycardia episodes percent overnight, not in the day  No severe episodes of tachycardia Previously with pain between shoulder blades at times, takes aleve  On praluent samples from our office Unable to afford the medication, was costing more than $5000 per year  Father with dementia Lost mother in the 62s, Lost brother from heart disease  EKG personally reviewed by myself on today's visit shows normal sinus rhythm with rate 60 bpm, T wave abnormality anterolateral leads  Other past medical history reviewed Cataract surgery: sept 26th, 2017 Second cataract surgery  last Thursday, anesthesia noted irregular rhythm She did not notice any symptoms Reports that she has not felt well since that time, feels weak  Off pralunt, changed to repatha,  She received samples from the office, did not the to fill her prescription despite aggressive marketing from specialists pharmacy. Was told that they would cancel her prescription. She was paying $600 per month and  reports that she was able to afford this. Prescription now canceled? Has been without medication for several months  Previous 30 day monitor again reviewed with her showing runs of atrial tachycardia, run of SVT, APCs, PVCs  Previously had significant myalgias on the fluvastatin  Symptoms improved by holding the medication Previously had gallbladder attack  and in the setting of severe pain, had tachycardia concerning for arrhythmia  Prior episode of tachycardia concerning for arrhythmia in the setting of back pain in the past  Strong family history of familial hypercholesterolemia.  She has tried numerous cholesterol medications in the past including Crestor, Lipitor, lovastatin. She did not tolerate these secondary to side effects.    PMH:   has a past medical history of Chest pain, Essential hypertension, Hiatal hernia, History of DVT (deep vein thrombosis), Hyperlipidemia, and Palpitations.  PSH:    Past Surgical History:  Procedure Laterality Date  . CATARACT EXTRACTION, BILATERAL    . LAPAROSCOPIC APPENDECTOMY N/A 12/02/2015   Procedure: APPENDECTOMY LAPAROSCOPIC;  Surgeon: Clovis Riley, MD;  Location: [facility];  Service: General;  Laterality: N/A;  . ROOT CANAL  2013  . ROOT CANAL    . VAGINAL HYSTERECTOMY     abdominal hysterectomy    Current Outpatient Medications  Medication Sig Dispense Refill  . Alirocumab (PRALUENT) 75 MG/ML SOPN Inject 1 pen into the skin every 14 (fourteen) days.    Marland Kitchen aspirin 81 MG tablet Take 162 mg by mouth daily.    . colchicine 0.6 MG tablet Take 1 tablet (0.6 mg total) by mouth 2 (two)  times daily as needed. As needed for gout flare 30 tablet 1  . ezetimibe (ZETIA) 10 MG tablet TAKE 1 TABLET BY MOUTH EVERY DAY 90 tablet 2  . flecainide (TAMBOCOR) 50 MG tablet Take 1 tablet (50 mg total) by mouth 2 (two) times daily as needed. 60 tablet 6  . losartan-hydrochlorothiazide (HYZAAR) 100-12.5 MG tablet Take 1 tablet by mouth daily. 30 tablet 6  .  nitrofurantoin, macrocrystal-monohydrate, (MACROBID) 100 MG capsule Take 1 capsule (100 mg total) by mouth 2 (two) times daily. 10 capsule 0  . Omega-3 Fatty Acids (FISH OIL) 1000 MG CAPS Take by mouth.    Marland Kitchen omeprazole (PRILOSEC) 20 MG capsule Take 20 mg by mouth daily.      . propranolol (INDERAL) 10 MG tablet Take 1 tablet (10 mg total) by mouth 3 (three) times daily as needed (palpitations). (Patient taking differently: Take 10 mg by mouth daily. ) 90 tablet 3   No current facility-administered medications for this visit.      Allergies:   Statins   Social History:  The patient  reports that  has never smoked. she has never used smokeless tobacco. She reports that she does not drink alcohol or use drugs.   Family History:   family history includes Colon cancer in her unknown relative; Heart disease in her father and mother; Thyroid cancer in her brother.    Review of Systems: Review of Systems  Constitutional: Negative.   Respiratory: Negative.   Cardiovascular:       Episodes of tachycardia  Gastrointestinal: Negative.   Musculoskeletal: Negative.   Neurological: Negative.   Psychiatric/Behavioral: The patient has insomnia.   All other systems reviewed and are negative.    PHYSICAL EXAM: VS:  BP 128/70 (BP Location: Left Arm, Patient Position: Sitting, Cuff Size: Large)   Pulse 60   Ht 5\' 6"  (1.676 m)   Wt 194 lb 4 oz (88.1 kg)   BMI 31.35 kg/m  , BMI Body mass index is 31.35 kg/m. GEN: Well nourished, well developed, in no acute distress , obese HEENT: normal  Neck: no JVD, carotid bruits, or masses Cardiac: RRR; no murmurs, rubs, or gallops,no edema  Respiratory:  clear to auscultation bilaterally, normal work of breathing GI: soft, nontender, nondistended, + BS MS: no deformity or atrophy  Skin: warm and dry, no rash Neuro:  Strength and sensation are intact Psych: euthymic mood, full affect    Recent Labs: 01/13/2017: ALT 11; BUN 18; Creatinine, Ser 0.98;  Hemoglobin 13.2; Platelets 262.0; Potassium 4.1; Sodium 139; TSH 1.41    Lipid Panel Lab Results  Component Value Date   CHOL 187 04/28/2016   HDL 53 04/28/2016   LDLCALC 71 04/28/2016   TRIG 313 (H) 04/28/2016      Wt Readings from Last 3 Encounters:  04/03/17 194 lb 4 oz (88.1 kg)  02/28/17 194 lb 9.6 oz (88.3 kg)  01/13/17 196 lb 3.2 oz (89 kg)       ASSESSMENT AND PLAN:  Pure hypercholesterolemia - Plan: EKG 12-Lead stay on her praluent,  Last total cholesterol 180 on praluent samples ("has not missed any doses") We will discuss with pharmacy what her new price would be for 2019  Coronary artery disease involving native coronary artery of native heart without angina pectoris - Plan: EKG 12-Lead Previous CT coronary calcium score 190 No recent chest pain symptoms No further testing at this time  Cardiac arrhythmia, unspecified cardiac arrhythmia type - Plan: EKG 12-Lead Long discussion concerning  her arrhythmia Episodes of atrial tachycardia, better with propranolol at night  Essential hypertension We have encouraged continued exercise, careful diet management in an effort to lose weight.   Total encounter time more than 25 minutes  Greater than 50% was spent in counseling and coordination of care with the patient   Disposition:   F/U  12 months   Orders Placed This Encounter  Procedures  . Lipid Profile  . EKG 12-Lead     Signed, Esmond Plants, M.D., Ph.D. 04/03/2017  [place], [place]

## 2017-04-03 ENCOUNTER — Ambulatory Visit (INDEPENDENT_AMBULATORY_CARE_PROVIDER_SITE_OTHER): Payer: Medicare Other | Admitting: Cardiovascular Disease

## 2017-04-03 ENCOUNTER — Encounter: Payer: Self-pay | Admitting: Cardiovascular Disease

## 2017-04-03 VITALS — BP 128/70 | HR 60 | Ht 66.0 in | Wt 194.2 lb

## 2017-04-03 DIAGNOSIS — E782 Mixed hyperlipidemia: Secondary | ICD-10-CM

## 2017-04-03 DIAGNOSIS — E7801 Familial hypercholesterolemia: Secondary | ICD-10-CM | POA: Diagnosis not present

## 2017-04-03 DIAGNOSIS — I10 Essential (primary) hypertension: Secondary | ICD-10-CM | POA: Diagnosis not present

## 2017-04-03 DIAGNOSIS — I499 Cardiac arrhythmia, unspecified: Secondary | ICD-10-CM

## 2017-04-03 DIAGNOSIS — I25118 Atherosclerotic heart disease of native coronary artery with other forms of angina pectoris: Secondary | ICD-10-CM | POA: Diagnosis not present

## 2017-04-03 DIAGNOSIS — I209 Angina pectoris, unspecified: Secondary | ICD-10-CM

## 2017-04-03 MED ORDER — COLCHICINE 0.6 MG PO TABS: 0.6000 mg | ORAL_TABLET | Freq: Two times a day (BID) | ORAL | 1 refills | Status: DC | PRN

## 2017-04-03 NOTE — Patient Instructions (Addendum)
Medication Instructions:   No medication changes made  Colchicine as needed for gout Medication Samples have been provided to the patient.  Drug name: Praluent       Strength: 75mg         Qty: 1 box  LOT: [id number]  Exp.Date: 30-06-2017   Labwork:  Lipid panel, fasting another day  Testing/Procedures:  No further testing at this time   Follow-Up: It was a pleasure seeing you in the office today. Please call us if you have new issues that need to be addressed before your next appt.  [phone]  Your physician wants you to follow-up in: 12 months.  You will receive a reminder letter in the mail two months in advance. If you don't receive a letter, please call our office to schedule the follow-up appointment.  If you need a refill on your cardiac medications before your next appointment, please call your pharmacy.

## 2017-04-11 ENCOUNTER — Telehealth: Payer: Self-pay | Admitting: *Deleted

## 2017-04-11 NOTE — Telephone Encounter (Signed)
-----   Message from Leeroy Bock, [facility] sent at 04/04/2017  9:43 AM EST ----- Regarding: RE: Assistance Agree that her relying on samples in the long run is not great. This is the patient assistance form for Praluent: HitProtect.dk.pdf?la=en  Income cutoffs are on the first page - if pt falls below the income cutoff, I'd have her come in to fill out the paperwork. She will need a prior authorization submitted first (this needs to be approved before she can be considered for patient assistance - I would submit it as a renewal since I believe she was approved 1 or 2 years ago before relying on samples). I do not see her part D insurance scanned in either so she will need to bring this when she comes in to fill out the patient assistance paperwork.  Thanks, Jinny Blossom  ----- Message ----- From: Valora Corporal, RN Sent: 04/04/2017   9:12 AM To: Leeroy Bock, RPH Subject: Assistance                                     Patient in yesterday to see Dr. Rockey Situ and she is only using samples at this time for her Praluent and he wanted me to reach out and see if she would be eligible for any other assistance programs.   Thanks for help with this and let me know if I can assist with anything.  Thanks, Lesleigh Noe.

## 2017-04-11 NOTE — Telephone Encounter (Signed)
Spoke with patient and reviewed income limits for assistance program and she stated that she would qualify. Will complete as much information as possible and leave up front for her to pick up later this week. Also advised that we need her Part D insurance scanned into our system. She verbalized understanding with no further questions at this time.

## 2017-04-14 ENCOUNTER — Other Ambulatory Visit
Admission: RE | Admit: 2017-04-14 | Discharge: 2017-04-14 | Disposition: A | Discharge status: Home / Self Care | Payer: Medicare Other | Source: Ambulatory Visit | Attending: Cardiovascular Disease | Admitting: Cardiovascular Disease

## 2017-04-14 DIAGNOSIS — E782 Mixed hyperlipidemia: Secondary | ICD-10-CM

## 2017-04-14 DIAGNOSIS — I25118 Atherosclerotic heart disease of native coronary artery with other forms of angina pectoris: Secondary | ICD-10-CM | POA: Insufficient documentation

## 2017-04-14 DIAGNOSIS — E7801 Familial hypercholesterolemia: Secondary | ICD-10-CM

## 2017-04-14 DIAGNOSIS — I10 Essential (primary) hypertension: Secondary | ICD-10-CM | POA: Diagnosis not present

## 2017-04-14 DIAGNOSIS — I499 Cardiac arrhythmia, unspecified: Secondary | ICD-10-CM | POA: Diagnosis not present

## 2017-04-14 LAB — LIPID PANEL
CHOLESTEROL: 218 mg/dL — AB (ref 0–200)
HDL: 49 mg/dL (ref >40)
LDL Cholesterol: 97 mg/dL (ref 0–99)
TRIGLYCERIDES: 362 mg/dL — AB (ref <150)
Total CHOL/HDL Ratio: 4.4 RATIO
VLDL: 72 mg/dL — ABNORMAL HIGH (ref 0–40)

## 2017-05-09 ENCOUNTER — Other Ambulatory Visit: Payer: Self-pay | Admitting: Cardiovascular Disease

## 2017-05-16 ENCOUNTER — Other Ambulatory Visit: Payer: Self-pay | Admitting: Cardiovascular Disease

## 2017-05-17 ENCOUNTER — Other Ambulatory Visit: Payer: Self-pay | Admitting: *Deleted

## 2017-05-17 MED ORDER — EZETIMIBE 10 MG PO TABS: 10.0000 mg | ORAL_TABLET | Freq: Every day | ORAL | 3 refills | Status: DC

## 2017-05-30 ENCOUNTER — Telehealth: Payer: Self-pay | Admitting: Cardiovascular Disease

## 2017-05-30 NOTE — Telephone Encounter (Signed)
We can provide with samples if available. Patient has completed assistance paperwork and it is pending review.

## 2017-05-30 NOTE — Telephone Encounter (Signed)
Praluent 75 mg samples placed in refrigerator for pick up.

## 2017-05-30 NOTE — Telephone Encounter (Signed)
Patient needs prior auth for praluent

## 2017-05-30 NOTE — Telephone Encounter (Addendum)
Spoke with patient and she received application for assistance and they are requesting prior authorization. Her application was previously mailed to her and she has not brought her Part D Insurance card for [place] to scan in our system. She provided me with all information listed on her card as listed below. She is due for injection this Friday and advised that I would check for samples and then I would reach out to our pharmacy team with regards to the prior auth that she is needing. She verbalized understanding of our conversation and had no further questions or concerns at this time.  [place] # [phone] Member ID # [id number] [place] # [phone] [place] # [id number] [employer] # [id number]  [facility] Reimbursement Department [address] [address] Provider Service # [phone] Customer Service # [phone] Issue date 03/03/2017  Advised patient that we really need to scan her card into our system so that her chart is updated. She verbalized understanding and was appreciative for the time in reviewing all of this information for her.

## 2017-05-30 NOTE — Telephone Encounter (Addendum)
Pt's last prior authorization completed by pharmacy team expired in July 2017, pt has been relying on samples since then.  Will submit new prior authorization for continued Praluent use. Approval letter will then need to be sent in with patient assistance application.

## 2017-05-30 NOTE — Telephone Encounter (Signed)
Pt requesting samples of Praluent 75 mg. Pt is due for ij. By this Friday. Please advise.

## 2017-05-30 NOTE — Telephone Encounter (Signed)
Patient calling the office for samples of medication:   1.  What medication and dosage are you requesting samples for? Praluent 75 mg sq q 14 days    2.  Are you currently out of this medication? Yes due Friday for inj

## 2017-05-31 ENCOUNTER — Telehealth: Payer: Self-pay | Admitting: Cardiovascular Disease

## 2017-05-31 NOTE — Telephone Encounter (Signed)
Please call for PA for Praluent.

## 2017-05-31 NOTE — Telephone Encounter (Signed)
Hi Heather,  It looks like Barista submitted her PA yesterday. Once we hear back from insurance will send approval along for inclusion with patient assistance application. Let us know if you need anything else at this time.  Thanks! Georgina Peer

## 2017-05-31 NOTE — Telephone Encounter (Signed)
Hey Megan/ Claiborne Billings,  I have not done a PA for Praluent before- do I do this or is this something you all do? Just let me know!  Thanks!

## 2017-06-02 MED ORDER — ALIROCUMAB 75 MG/ML ~~LOC~~ SOPN: 1.0000 "pen " | PEN_INJECTOR | SUBCUTANEOUS | 11 refills | Status: DC

## 2017-06-02 NOTE — Addendum Note (Signed)
Addended by: SUPPLE, MEGAN E on: 06/02/2017 04:08 PM   Modules accepted: Orders

## 2017-06-02 NOTE — Telephone Encounter (Signed)
Praluent prior Dawn Herring has been approved. Sending rx to specialty pharmacy to assess if copay is affordable.

## 2017-06-06 NOTE — Telephone Encounter (Addendum)
Spoke with patient and reviewed that we received letter and assistance paperwork. She requested that I drop it in the mail to her and she verbalized understanding to call back if she should have any questions. She was appreciative for the call with no further questions at this time. Will have Dr. Rockey Situ sign his portion and then place in outgoing mail to patients listed address. Advised that if she should have any questions to please give me a call back. She verbalized understanding with no further questions at this time. (copy made and placed above desk)

## 2017-06-06 NOTE — Telephone Encounter (Signed)
Praluent copay is $412 per month. Pt will need to fill out [employer] patient assistance form for Praluent.

## 2017-06-07 ENCOUNTER — Other Ambulatory Visit: Payer: Self-pay | Admitting: *Deleted

## 2017-06-07 MED ORDER — LOSARTAN POTASSIUM-HCTZ 100-12.5 MG PO TABS: 1.0000 | ORAL_TABLET | Freq: Every day | ORAL | 3 refills | Status: DC

## 2017-06-28 NOTE — Telephone Encounter (Signed)
Spoke with patient and reviewed that we received fax on 06/22/17 and that she was approved for assistance. She states that she has not heard anything from them. Provided her with phone number to call for further instructions to receive her medication. She was appreciative for the call and had no further questions at this time.

## 2017-06-28 NOTE — Telephone Encounter (Signed)
Patient wants to talk to pam about getting an rx for praluent she is due for injection soon

## 2017-07-07 ENCOUNTER — Telehealth: Payer: Self-pay | Admitting: Cardiovascular Disease

## 2017-07-07 NOTE — Telephone Encounter (Signed)
Received fax from the [employer] program on her Praluent and that she will be getting medication through them.

## 2017-07-14 ENCOUNTER — Ambulatory Visit: Payer: Medicare Other | Admitting: Family Medicine

## 2017-07-18 ENCOUNTER — Ambulatory Visit (INDEPENDENT_AMBULATORY_CARE_PROVIDER_SITE_OTHER): Payer: Medicare Other | Admitting: Family Medicine

## 2017-07-18 ENCOUNTER — Encounter: Payer: Self-pay | Admitting: Family Medicine

## 2017-07-18 ENCOUNTER — Other Ambulatory Visit: Payer: Self-pay

## 2017-07-18 VITALS — BP 122/62 | HR 63 | Temp 97.7°F | Ht 66.0 in | Wt 190.6 lb

## 2017-07-18 DIAGNOSIS — I25118 Atherosclerotic heart disease of native coronary artery with other forms of angina pectoris: Secondary | ICD-10-CM

## 2017-07-18 DIAGNOSIS — E782 Mixed hyperlipidemia: Secondary | ICD-10-CM

## 2017-07-18 DIAGNOSIS — I499 Cardiac arrhythmia, unspecified: Secondary | ICD-10-CM | POA: Diagnosis not present

## 2017-07-18 DIAGNOSIS — M791 Myalgia, unspecified site: Secondary | ICD-10-CM | POA: Insufficient documentation

## 2017-07-18 DIAGNOSIS — I10 Essential (primary) hypertension: Secondary | ICD-10-CM

## 2017-07-18 DIAGNOSIS — Z1211 Encounter for screening for malignant neoplasm of colon: Secondary | ICD-10-CM

## 2017-07-18 NOTE — Progress Notes (Signed)
  Tommi Rumps, MD Phone: [phone]  Dawn Herring is a 72 y.o. female who presents today for f/u.  HYPERLIPIDEMIA Symptoms Chest pain on exertion:  no    Medications: Compliance- taking praluent and zetia Right upper quadrant pain- no  Muscle aches- yes  HYPERTENSION  Disease Monitoring  Home BP Monitoring stable Chest pain- no    Dyspnea- no Medications  Compliance-  Taking losartan/hctz.   She notes she has done well with her palpitations.  They have rarely occurred.  She has not required any flecainide. occasionally feels like she can't get a good deep breath when she has the palpitations though otherwise no shortness of breath.  She has had stiffness and muscle aches in her thighs and her left hip over the last month or so.  She has been doing a lot more work at home working on her house.  Notes her left hip hurts if she lays on it and hurts with other movements.  She notes no injuries.  She has tried Aleve and Tylenol with little benefit.     Social History   Tobacco Use  Smoking Status Never Smoker  Smokeless Tobacco Never Used     ROS see history of present illness  Objective  Physical Exam Vitals:   07/18/17 1031  BP: 122/62  Pulse: 63  Temp: 97.7 F (36.5 C)  SpO2: 96%    BP Readings from Last 3 Encounters:  07/18/17 122/62  04/03/17 128/70  02/28/17 (!) 143/72   Wt Readings from Last 3 Encounters:  07/18/17 190 lb 9.6 oz (86.5 kg)  04/03/17 194 lb 4 oz (88.1 kg)  02/28/17 194 lb 9.6 oz (88.3 kg)    Physical Exam  Constitutional: No distress.  Cardiovascular: Normal rate, regular rhythm and normal heart sounds.  Pulmonary/Chest: Effort normal and breath sounds normal.  Musculoskeletal: She exhibits no edema.  Slight soreness on palpation of her anterior thighs, slight discomfort on palpation of lateral left hip, normal internal and external range of motion bilateral hips with no discomfort  Neurological: She is alert.  Skin: Skin is warm  and dry. She is not diaphoretic.     Assessment/Plan: Please see individual problem list.  HTN (hypertension) Well-controlled.  Continue current regimen.  Essential hypertension Well-controlled.  Continue current regimen.  Arrhythmia No recent symptoms.  She will continue to follow with cardiology and continue her as needed medication.  Hyperlipidemia She will return for fasting lipid panel.  She will hold her Zetia as this could contribute to her muscle aches.  She will continue Praluent  Myalgia Possibly related to Zetia or recent increase in muscle use.  She will return for lab work.  She will discontinue the Zetia.  If not improving she will let us know.   Health Maintenance: Cologuard ordered.  No family history of colon cancer or personal history of polyps or rectal bleeding.  Orders Placed This Encounter  Procedures  . Cologuard  . Lipid panel    Standing Status:   Future    Standing Expiration Date:   07/19/2018  . Comp Met (CMET)    Standing Status:   Future    Standing Expiration Date:   07/19/2018  . CK (Creatine Kinase)    Standing Status:   Future    Standing Expiration Date:   07/19/2018    No orders of the defined types were placed in this encounter.    Tommi Rumps, MD [place]

## 2017-07-18 NOTE — Assessment & Plan Note (Signed)
Well-controlled.  Continue current regimen. 

## 2017-07-18 NOTE — Patient Instructions (Addendum)
Nice to see you. We will check lab work and then consider coming off of Zetia to see if that helps with your muscles.

## 2017-07-18 NOTE — Assessment & Plan Note (Addendum)
She will return for fasting lipid panel.  She will hold her Zetia as this could contribute to her muscle aches.  She will continue Praluent

## 2017-07-18 NOTE — Assessment & Plan Note (Signed)
No recent symptoms.  She will continue to follow with cardiology and continue her as needed medication.

## 2017-07-18 NOTE — Assessment & Plan Note (Signed)
Possibly related to Zetia or recent increase in muscle use.  She will return for lab work.  She will discontinue the Zetia.  If not improving she will let us know.

## 2017-07-21 ENCOUNTER — Other Ambulatory Visit (INDEPENDENT_AMBULATORY_CARE_PROVIDER_SITE_OTHER): Payer: Medicare Other

## 2017-07-21 DIAGNOSIS — M791 Myalgia, unspecified site: Secondary | ICD-10-CM | POA: Diagnosis not present

## 2017-07-21 DIAGNOSIS — E782 Mixed hyperlipidemia: Secondary | ICD-10-CM | POA: Diagnosis not present

## 2017-07-21 DIAGNOSIS — I10 Essential (primary) hypertension: Secondary | ICD-10-CM | POA: Diagnosis not present

## 2017-07-21 LAB — COMPREHENSIVE METABOLIC PANEL
ALBUMIN: 4 g/dL (ref 3.5–5.2)
ALK PHOS: 59 U/L (ref 39–117)
ALT: 11 U/L (ref 0–35)
AST: 18 U/L (ref 0–37)
BILIRUBIN TOTAL: 0.4 mg/dL (ref 0.2–1.2)
BUN: 19 mg/dL (ref 6–23)
CO2: 27 mEq/L (ref 19–32)
Calcium: 9.5 mg/dL (ref 8.4–10.5)
Chloride: 105 mEq/L (ref 96–112)
Creatinine, Ser: 0.91 mg/dL (ref 0.40–1.20)
GFR: 64.52 mL/min (ref >60.00)
Glucose, Bld: 99 mg/dL (ref 70–99)
POTASSIUM: 4 meq/L (ref 3.5–5.1)
SODIUM: 141 meq/L (ref 135–145)
TOTAL PROTEIN: 7.1 g/dL (ref 6.0–8.3)

## 2017-07-21 LAB — LIPID PANEL
CHOLESTEROL: 157 mg/dL (ref 0–200)
HDL: 54.2 mg/dL (ref >39.00)
LDL Cholesterol: 72 mg/dL (ref 0–99)
NonHDL: 102.91
Total CHOL/HDL Ratio: 3
Triglycerides: 153 mg/dL — ABNORMAL HIGH (ref 0.0–149.0)
VLDL: 30.6 mg/dL (ref 0.0–40.0)

## 2017-07-21 LAB — CK: Total CK: 89 U/L (ref 7–177)

## 2017-08-01 DIAGNOSIS — Z1211 Encounter for screening for malignant neoplasm of colon: Secondary | ICD-10-CM | POA: Diagnosis not present

## 2017-08-01 DIAGNOSIS — Z1212 Encounter for screening for malignant neoplasm of rectum: Secondary | ICD-10-CM | POA: Diagnosis not present

## 2017-08-01 LAB — COLOGUARD: COLOGUARD: NEGATIVE

## 2017-08-14 ENCOUNTER — Encounter: Payer: Self-pay | Admitting: Family Medicine

## 2017-08-16 ENCOUNTER — Telehealth: Payer: Self-pay

## 2017-08-16 NOTE — Telephone Encounter (Signed)
Received Negative cologuard results. Patient notified of results and verbalized understanding

## 2017-08-25 ENCOUNTER — Ambulatory Visit (INDEPENDENT_AMBULATORY_CARE_PROVIDER_SITE_OTHER): Payer: Medicare Other

## 2017-08-25 VITALS — BP 128/64 | HR 75 | Temp 98.2°F | Resp 14 | Ht 65.0 in | Wt 191.8 lb

## 2017-08-25 DIAGNOSIS — Z0000 Encounter for general adult medical examination without abnormal findings: Secondary | ICD-10-CM

## 2017-08-25 NOTE — Patient Instructions (Addendum)
  Ms. Gardiner , Thank you for taking time to come for your Medicare Wellness Visit. I appreciate your ongoing commitment to your health goals. Please review the following plan we discussed and let me know if I can assist you in the future.   These are the goals we discussed: Goals    . weight at 175lb      Walk for exercise Low carb diet Reduce sugar intake       This is a list of the screening recommended for you and due dates:  Health Maintenance  Topic Date Due  . Tetanus Vaccine  03/12/1965  . DEXA scan (bone density measurement)  03/12/2010  . Pneumonia vaccines (2 of 2 - PPSV23) 07/14/2017  . Flu Shot  10/26/2017  . Mammogram  08/20/2018  . Cologuard (Stool DNA test)  08/02/2020  .  Hepatitis C: One time screening is recommended by Center for Disease Control  (CDC) for  adults born from [birth year] through 1966.   Completed   Bone Densitometry Bone densitometry is an imaging test that uses a special X-ray to measure the amount of calcium and other minerals in your bones (bone density). This test is also known as a bone mineral density test or dual-energy X-ray absorptiometry (DXA). The test can measure bone density at your hip and your spine. It is similar to having a regular X-ray. You may have this test to:  Diagnose a condition that causes weak or thin bones (osteoporosis).  Predict your risk of a broken bone (fracture).  Determine how well osteoporosis treatment is working.  Tell a health care provider about:  Any allergies you have.  All medicines you are taking, including vitamins, herbs, eye drops, creams, and over-the-counter medicines.  Any problems you or family members have had with anesthetic medicines.  Any blood disorders you have.  Any surgeries you have had.  Any medical conditions you have.  Possibility of pregnancy.  Any other medical test you had within the previous 14 days that used contrast material. What are the risks? Generally, this is a safe  procedure. However, problems can occur and may include the following:  This test exposes you to a very small amount of radiation.  The risks of radiation exposure may be greater to unborn children.  What happens before the procedure?  Do not take any calcium supplements for 24 hours before having the test. You can otherwise eat and drink what you usually do.  Take off all metal jewelry, eyeglasses, dental appliances, and any other metal objects. What happens during the procedure?  You may lie on an exam table. There will be an X-ray generator below you and an imaging device above you.  Other devices, such as boxes or braces, may be used to position your body properly for the scan.  You will need to lie still while the machine slowly scans your body.  The images will show up on a computer monitor. What happens after the procedure? You may need more testing at a later time. This information is not intended to replace advice given to you by your health care provider. Make sure you discuss any questions you have with your health care provider. Document Released: 04/05/2004 Document Revised: 08/20/2015 Document Reviewed: 08/22/2013 Elsevier Interactive Patient Education  2018 [employer].

## 2017-08-25 NOTE — Progress Notes (Signed)
Subjective:   Dawn Herring is a 72 y.o. female who presents for Medicare Annual (Subsequent) preventive examination.  Review of Systems:  No ROS.  Medicare Wellness Visit. Additional risk factors are reflected in the social history.  Cardiac Risk Factors include: advanced age (>39men, >54 women);hypertension;obesity (BMI >30kg/m2)     Objective:     Vitals: BP 128/64 (BP Location: Left Arm, Patient Position: Sitting, Cuff Size: Normal)   Pulse 75   Temp 98.2 F (36.8 C) (Oral)   Resp 14   Ht 5\' 5"  (1.651 m)   Wt 191 lb 12.8 oz (87 kg)   SpO2 95%   BMI 31.92 kg/m   Body mass index is 31.92 kg/m.  Advanced Directives 08/25/2017 08/24/2016 12/02/2015  Does Patient Have a Medical Advance Directive? Yes Yes No  Type of Paramedic of [place];Living will [place];Living will -  Does patient want to make changes to medical advance directive? No - Patient declined No - Patient declined -  Copy of [place] in Chart? No - copy requested No - copy requested -  Would patient like information on creating a medical advance directive? - - No - patient declined information    Tobacco Social History   Tobacco Use  Smoking Status Never Smoker  Smokeless Tobacco Never Used     Counseling given: Not Answered   Clinical Intake:  Pre-visit preparation completed: Yes  Pain : No/denies pain     Nutritional Status: BMI > 30  Obese Diabetes: No  How often do you need to have someone help you when you read instructions, pamphlets, or other written materials from your doctor or pharmacy?: 1 - Never  Interpreter Needed?: No     Past Medical History:  Diagnosis Date  . Chest pain    a. Neg stress tests - 2005, 2008, 2009;  b. 08/2014 Cardiac CT: Ca score 193 (80th%'ile).  . Essential hypertension   . Hiatal hernia    a. 08/2014 CT chest: large hiatal hernia.  Marland Kitchen History of DVT (deep vein thrombosis)   . Hyperlipidemia     a. statin intolerant;  b. on zetia and repatha.  . Palpitations    a. 02/2015 Event Monitor: NSR w/ rare, short episodes of A Tach, rare short runs of SVT < 15 beats.   Past Surgical History:  Procedure Laterality Date  . CATARACT EXTRACTION, BILATERAL    . LAPAROSCOPIC APPENDECTOMY N/A 12/02/2015   Procedure: APPENDECTOMY LAPAROSCOPIC;  Surgeon: Clovis Riley, MD;  Location: [facility];  Service: General;  Laterality: N/A;  . ROOT CANAL  2013  . ROOT CANAL    . VAGINAL HYSTERECTOMY     abdominal hysterectomy   Family History  Problem Relation Age of Onset  . Heart disease Mother   . Heart disease Father   . Colon cancer Unknown        Grandparent  . Thyroid cancer Brother   . Heart disease Brother   . Stroke Brother   . Hyperlipidemia Sister    Social History   Socioeconomic History  . Marital status: Married    Spouse name: Not on file  . Number of children: Not on file  . Years of education: Not on file  . Highest education level: Not on file  Occupational History  . Not on file  Social Needs  . Financial resource strain: Not on file  . Food insecurity:    Worry: Never true  Inability: Never true  . Transportation needs:    Medical: No    Non-medical: No  Tobacco Use  . Smoking status: Never Smoker  . Smokeless tobacco: Never Used  Substance and Sexual Activity  . Alcohol use: No  . Drug use: No  . Sexual activity: Yes  Lifestyle  . Physical activity:    Days per week: Not on file    Minutes per session: Not on file  . Stress: Not on file  Relationships  . Social connections:    Talks on phone: Not on file    Gets together: Not on file    Attends religious service: Not on file    Active member of club or organization: Not on file    Attends meetings of clubs or organizations: Not on file    Relationship status: Not on file  Other Topics Concern  . Not on file  Social History Narrative  . Not on file    Outpatient Encounter Medications as of  08/25/2017  Medication Sig  . Alirocumab (PRALUENT) 75 MG/ML SOPN Inject 1 pen into the skin every 14 (fourteen) days.  Marland Kitchen aspirin 81 MG tablet Take 162 mg by mouth daily.  . colchicine 0.6 MG tablet Take 1 tablet (0.6 mg total) by mouth 2 (two) times daily as needed. As needed for gout flare  . ezetimibe (ZETIA) 10 MG tablet Take 1 tablet (10 mg total) by mouth daily.  . flecainide (TAMBOCOR) 50 MG tablet Take 1 tablet (50 mg total) by mouth 2 (two) times daily as needed.  Marland Kitchen losartan-hydrochlorothiazide (HYZAAR) 100-12.5 MG tablet Take 1 tablet by mouth daily.  Marland Kitchen omeprazole (PRILOSEC) 20 MG capsule Take 20 mg by mouth daily.    . propranolol (INDERAL) 10 MG tablet Take 1 tablet (10 mg total) by mouth 3 (three) times daily as needed (palpitations). (Patient taking differently: Take 10 mg by mouth daily. )  . [DISCONTINUED] Omega-3 Fatty Acids (FISH OIL) 1000 MG CAPS Take by mouth.   No facility-administered encounter medications on file as of 08/25/2017.     Activities of Daily Living In your present state of health, do you have any difficulty performing the following activities: 08/25/2017  Hearing? N  Vision? N  Difficulty concentrating or making decisions? N  Walking or climbing stairs? Y  Comment L hip pain, intermittent  Dressing or bathing? N  Doing errands, shopping? N  Preparing Food and eating ? N  Using the Toilet? N  In the past six months, have you accidently leaked urine? N  Do you have problems with loss of bowel control? N  Managing your Medications? N  Managing your Finances? N  Housekeeping or managing your Housekeeping? Y  Comment Staff assists  Some recent data might be hidden    Patient Care Team: Leone Haven, MD as PCP - General (Family Medicine) Minna Merritts, MD as Consulting Physician (Cardiology)    Assessment:   This is a routine wellness examination for [facility]. The goal of the wellness visit is to assist the patient how to close the gaps in care  and create a preventative care plan for the patient.   The roster of all physicians providing medical care to patient is listed in the Snapshot section of the chart.  Osteoporosis risk reviewed.  Dexa Scan declined.   Safety issues reviewed; Smoke and carbon monoxide detectors in the home. No firearms or firearms locked in a safe within the home. Wears seatbelts when driving or riding  with others. No violence in the home.  They do not have excessive sun exposure.  Discussed the need for sun protection: hats, long sleeves and the use of sunscreen if there is significant sun exposure.  Patient is alert, normal appearance, oriented to person/place/and time.  Correctly identified the president of the [place] and recalls of 3/3 words. Performs simple calculations and can read correct time from watch face. Displays appropriate judgement.  No new identified risk were noted.  No failures at ADL's or IADL's.  BMI- discussed the importance of a healthy diet, water intake and the benefits of aerobic exercise. Educational material provided.   24 hour diet recall: Low carb foods  Dental- every 6 months.  Eye- Visual acuity not assessed per patient preference since they have regular follow up with the ophthalmologist.  Wears corrective lenses.  Sleep patterns- Sleeps 5 hours at night.    Pneumococcal deferred.   Patient Concerns: None at this time. Follow up with PCP as needed.  Exercise Activities and Dietary recommendations Current Exercise Habits: The patient does not participate in regular exercise at present  Goals    . weight at 175lb      Walk for exercise Low carb diet Reduce sugar intake       Fall Risk Fall Risk  08/25/2017 08/24/2016 07/08/2016 01/28/2015  Falls in the past year? No No No No   epression Screen PHQ 2/9 Scores 08/25/2017 08/24/2016 07/08/2016 01/28/2015  PHQ - 2 Score 0 0 0 0     Cognitive Function     6CIT Screen 08/25/2017 08/24/2016  What Year? 0 points  0 points  What month? 0 points 0 points  What time? 0 points 0 points  Count back from 20 0 points 0 points  Months in reverse 0 points 0 points  Repeat phrase 0 points 0 points  Total Score 0 0    Immunization History  Administered Date(s) Administered  . Hepatitis B, adult 03/17/2015, 04/21/2015, 09/15/2015  . Influenza, High Dose Seasonal PF 01/13/2017  . Influenza,inj,Quad PF,6+ Mos 03/03/2015, 02/17/2016  . Pneumococcal Conjugate-13 07/14/2016    Screening Tests Health Maintenance  Topic Date Due  . TETANUS/TDAP  03/12/1964  . DEXA SCAN  03/12/2010  . PNA vac Low Risk Adult (2 of 2 - PPSV23) 07/14/2017  . INFLUENZA VACCINE  10/26/2017  . MAMMOGRAM  08/20/2018  . Fecal DNA (Cologuard)  08/02/2020  . Hepatitis C Screening  Completed       Plan:    End of life planning; Advance aging; Advanced directives discussed. Copy of current HCPOA/Living Will requested.    I have personally reviewed and noted the following in the patient's chart:   . Medical and social history . Use of alcohol, tobacco or illicit drugs  . Current medications and supplements . Functional ability and status . Nutritional status . Physical activity . Advanced directives . List of other physicians . Hospitalizations, surgeries, and ER visits in previous 12 months . Vitals . Screenings to include cognitive, depression, and falls . Referrals and appointments  In addition, I have reviewed and discussed with patient certain preventive protocols, quality metrics, and best practice recommendations. A written personalized care plan for preventive services as well as general preventive health recommendations were provided to patient.     Varney Biles, LPN  1/70/0174

## 2017-10-02 ENCOUNTER — Telehealth: Payer: Self-pay | Admitting: Cardiovascular Disease

## 2017-10-02 NOTE — Telephone Encounter (Signed)
Pt calling asking to see if we had the number for the [employer].  She states she had it but lose the number.   Please call back with this

## 2017-10-02 NOTE — Telephone Encounter (Signed)
Patient given the Praluent number [phone]. She verbalized understanding. She is due to receive a shipment by this Friday and just wants to contact them to make sure its on the way. She was very Patent attorney.

## 2017-10-04 ENCOUNTER — Other Ambulatory Visit: Payer: Self-pay | Admitting: Cardiovascular Disease

## 2017-11-19 IMAGING — CR DG ANKLE COMPLETE 3+V*R*
2 series · 2 of 2 positions shown · non-contrast
Comparison: Foot radiographs from the same day.

CLINICAL DATA: Right foot and ankle pain, worse over the last week.
No injury.

EXAM:
RIGHT ANKLE - COMPLETE 3+ VIEW

[view not recorded (1 of 2)]
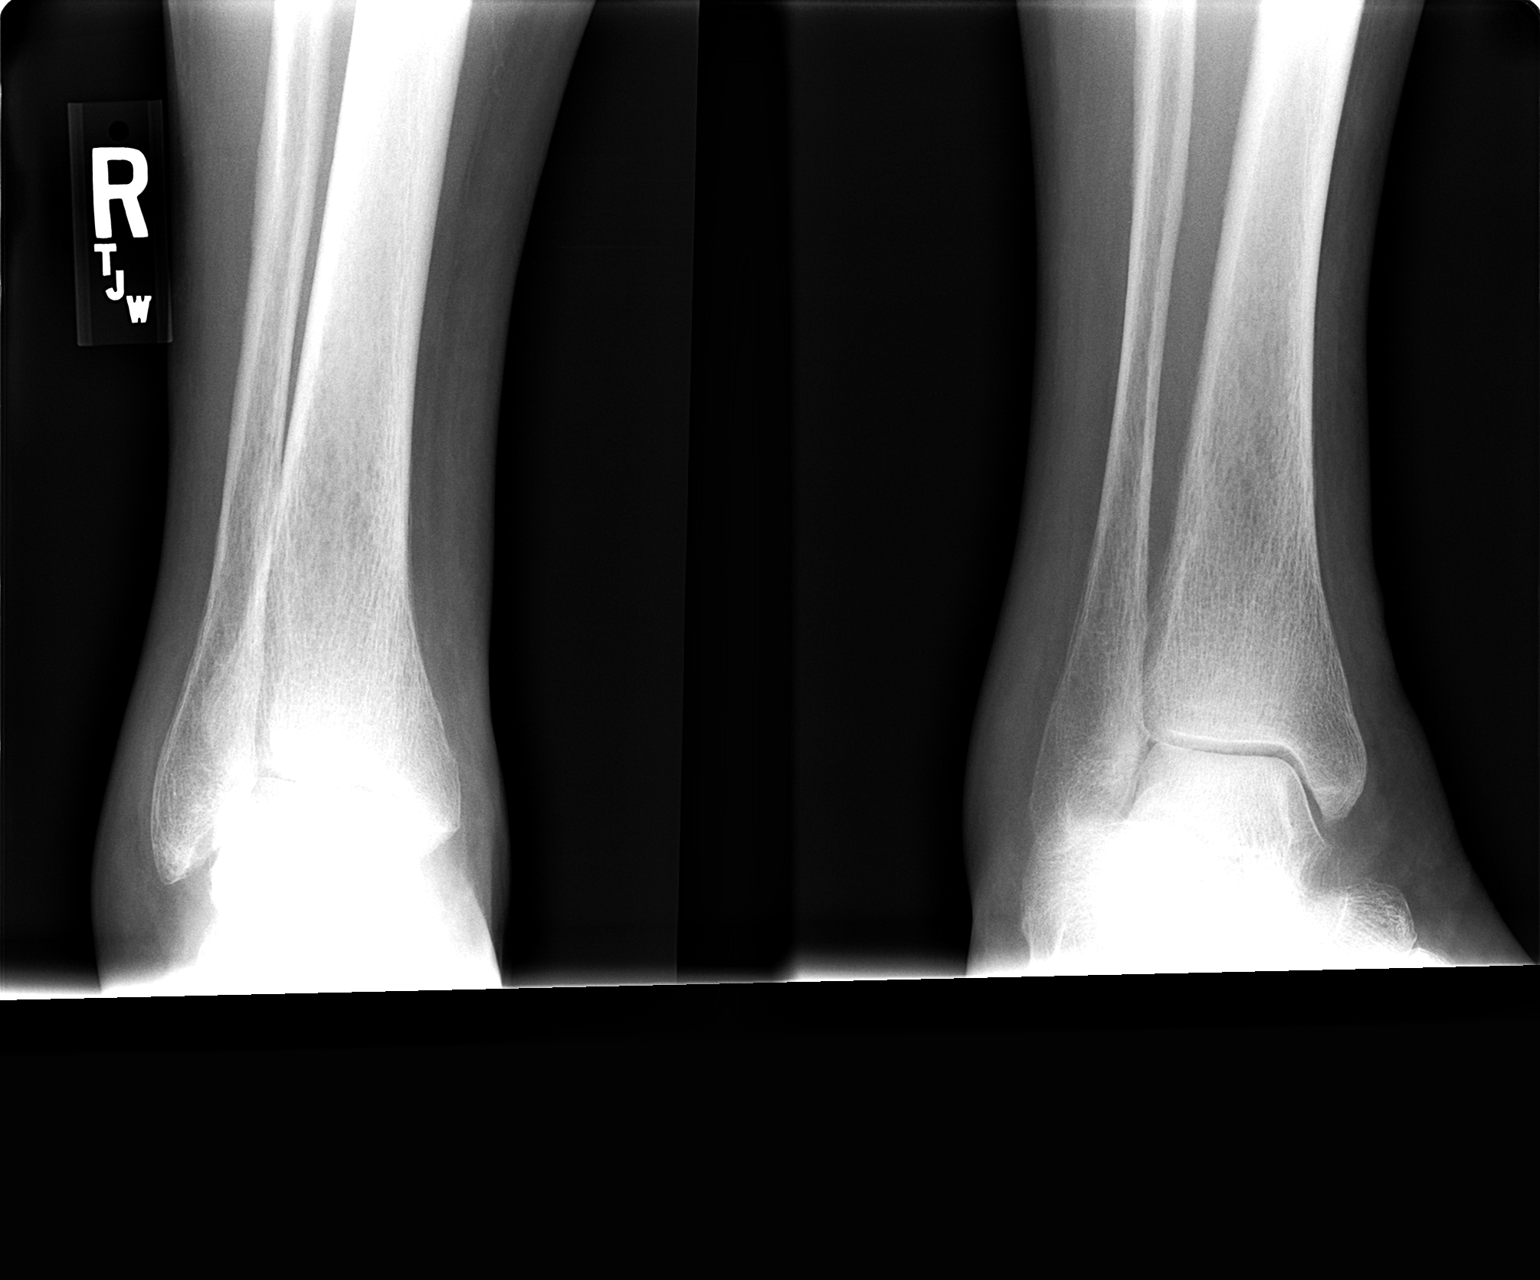

[view not recorded (2 of 2)]
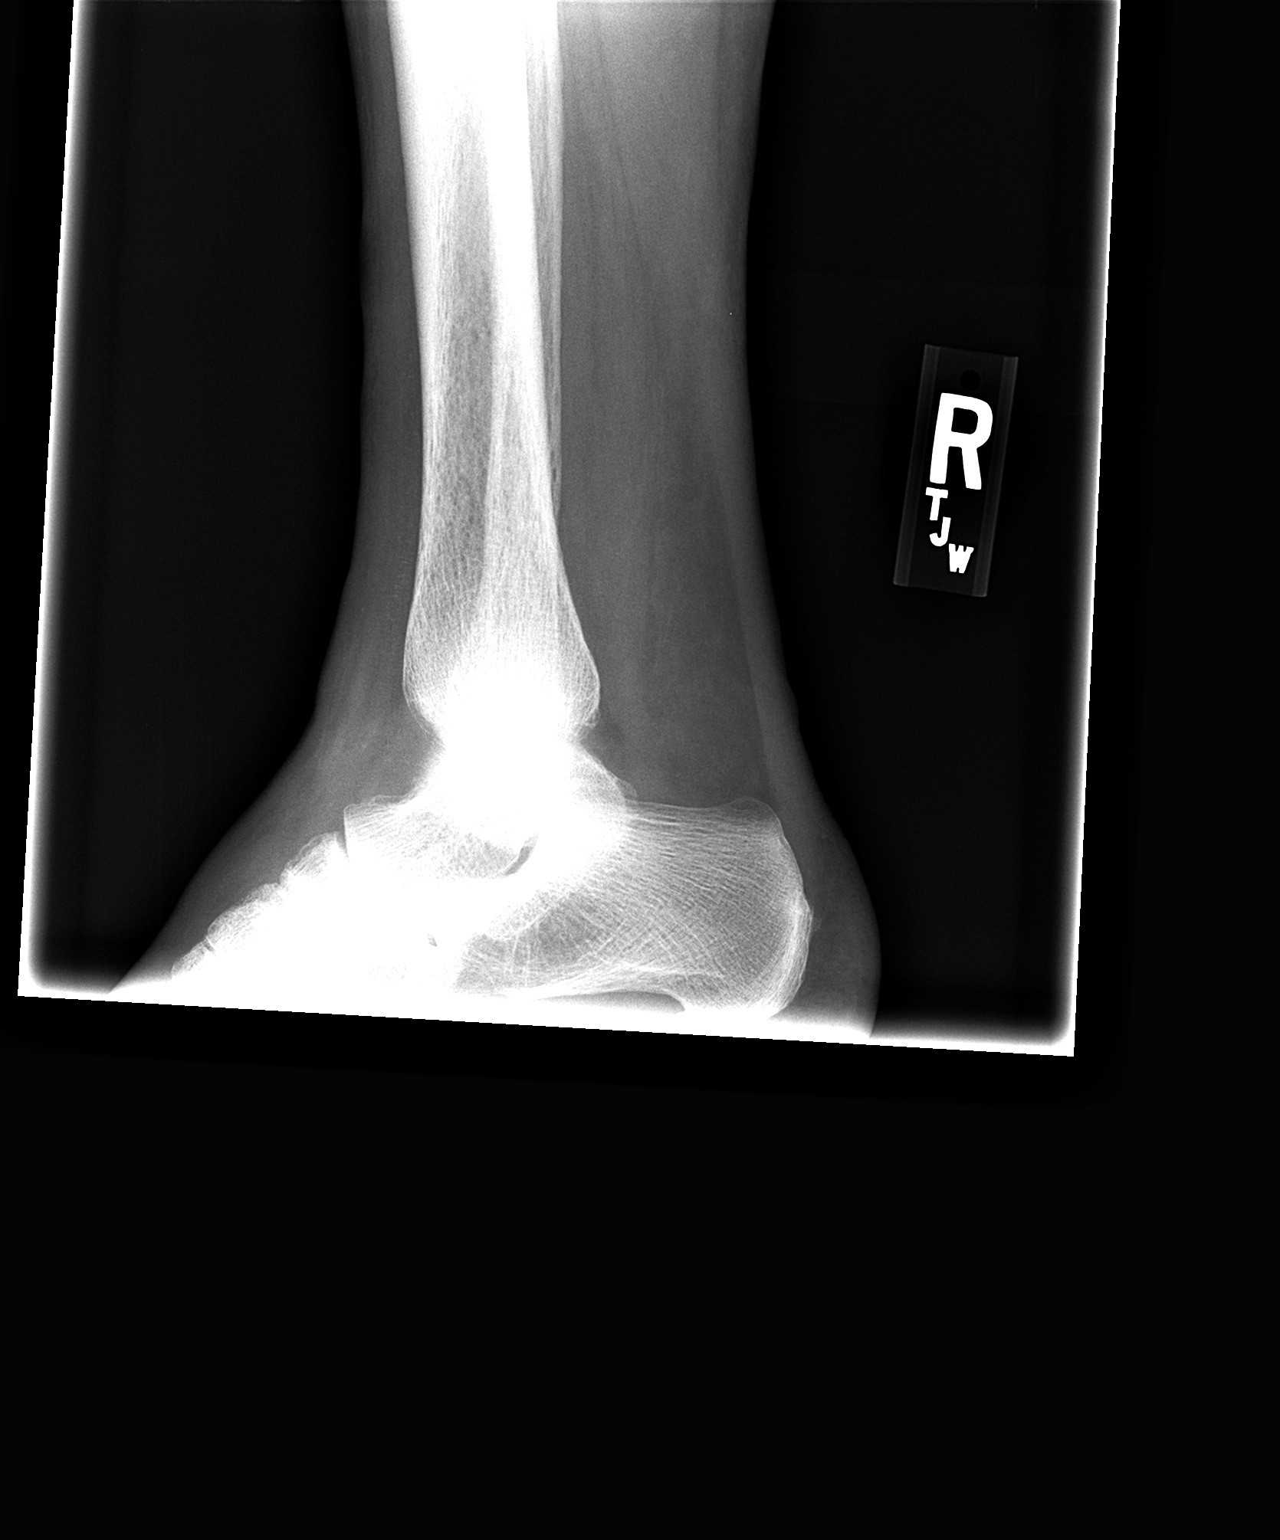

[2 of 2 positions shown; findings below may reference images not displayed]

FINDINGS: Soft tissue swelling is present over the lateral malleolus without
an acute underlying fracture. Bone mineralization is within normal
limits. No focal osseous lesions are present. The ankle joint is
located.
IMPRESSION: 1. Soft tissue swelling over the lateral malleolus without
underlying fracture or osseous lesion. This could represent
ligamentous injury.
2. Otherwise negative right ankle radiographs.

## 2017-11-19 IMAGING — CR DG FOOT COMPLETE 3+V*R*
3 series · 3 of 3 positions shown · non-contrast
Comparison: None.

CLINICAL DATA: Chronic pain, more severe past week. No recent
trauma.

EXAM:
RIGHT FOOT COMPLETE - 3+ VIEW

[view not recorded (1 of 3)]
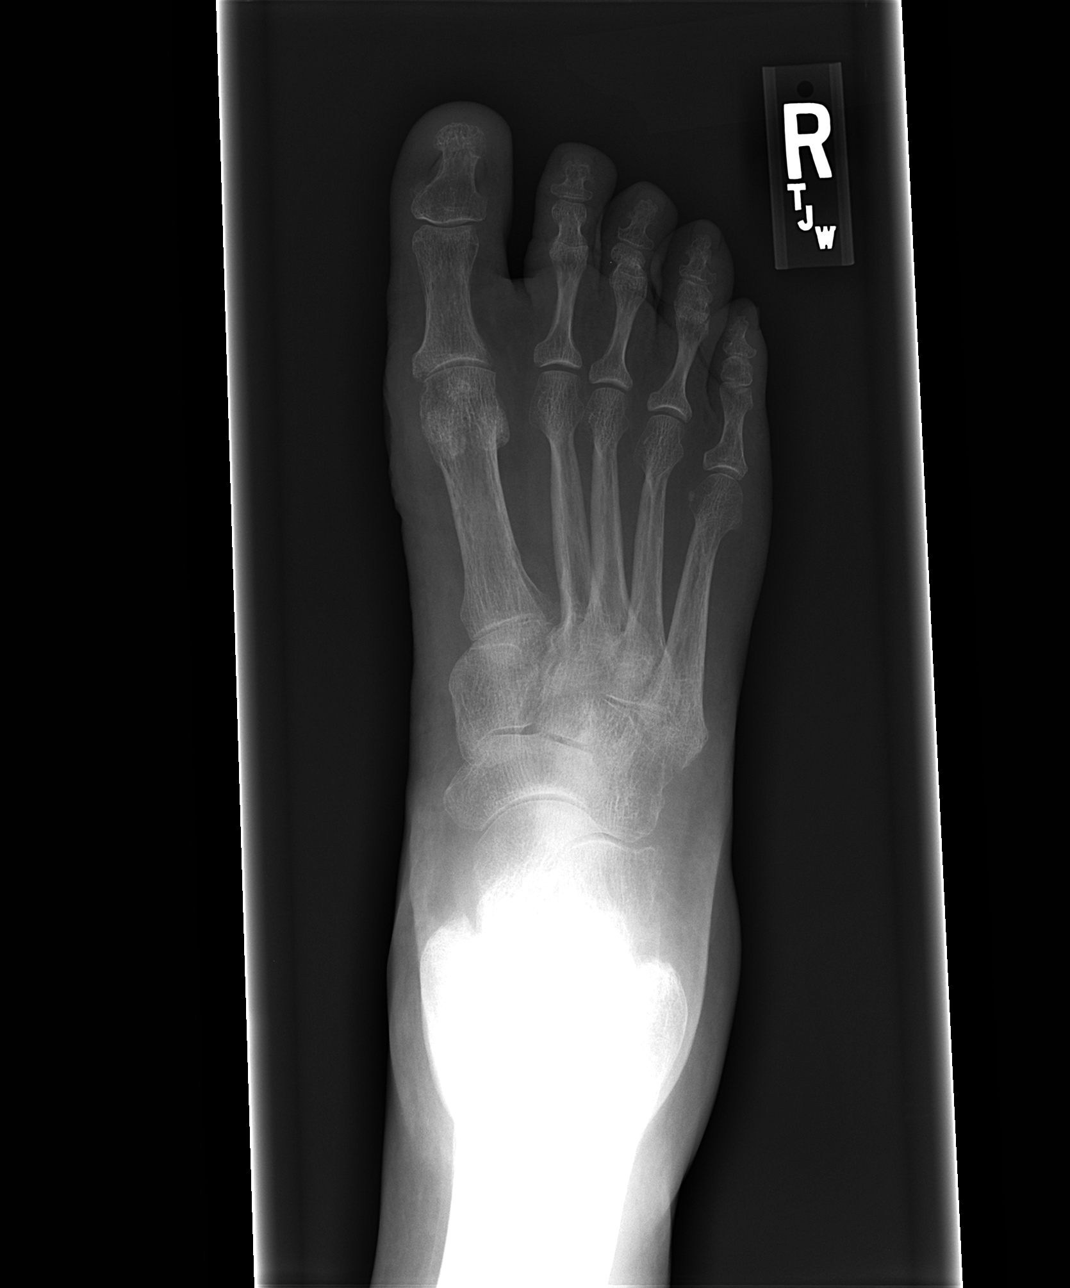

[view not recorded (2 of 3)]
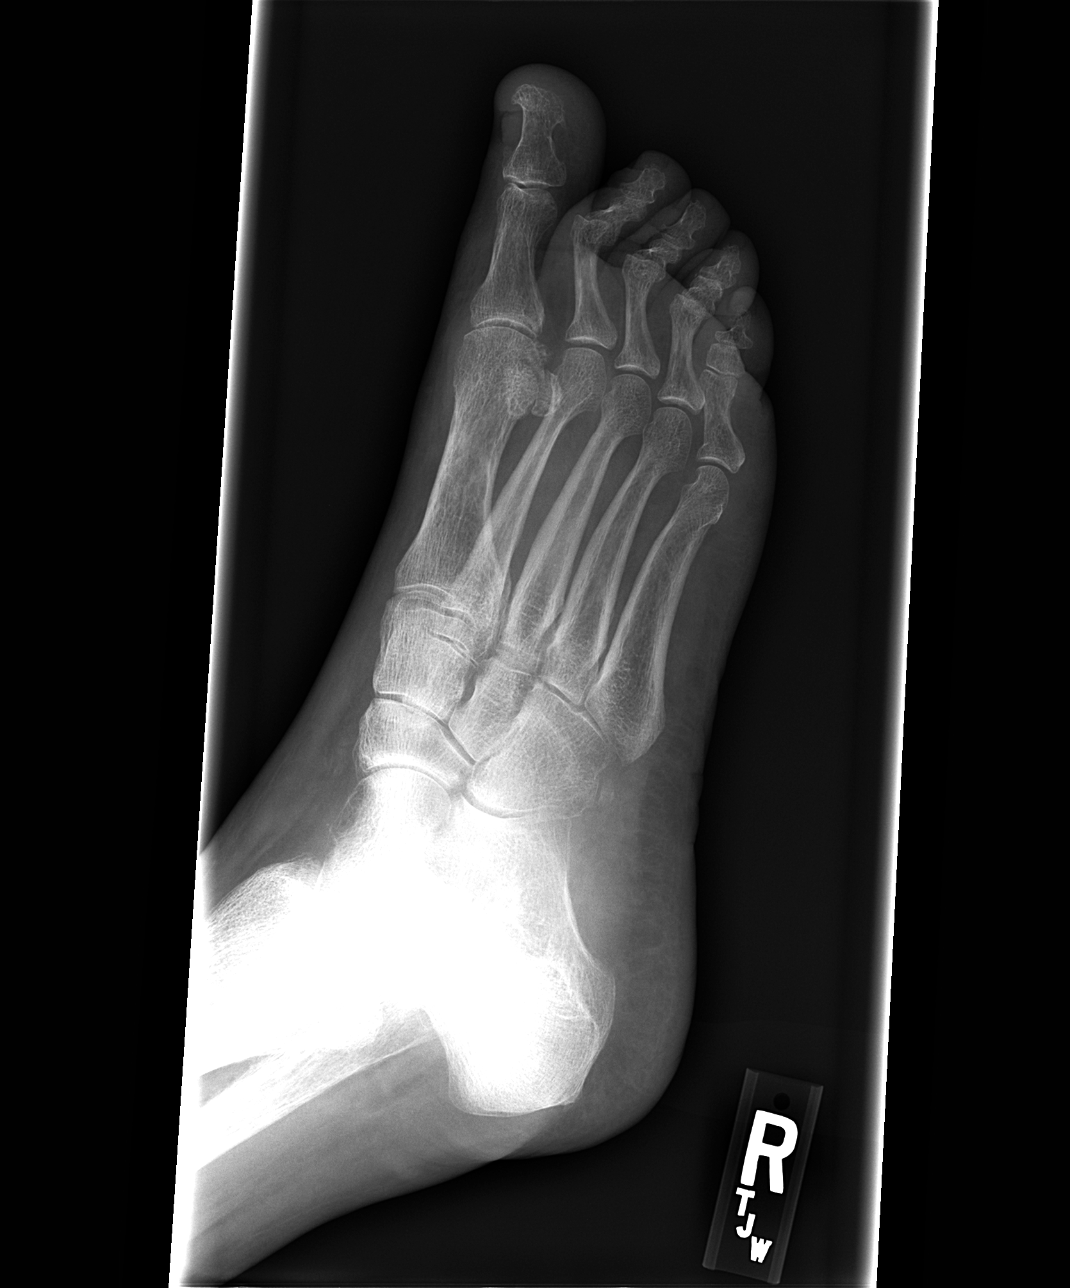

[view not recorded (3 of 3)]
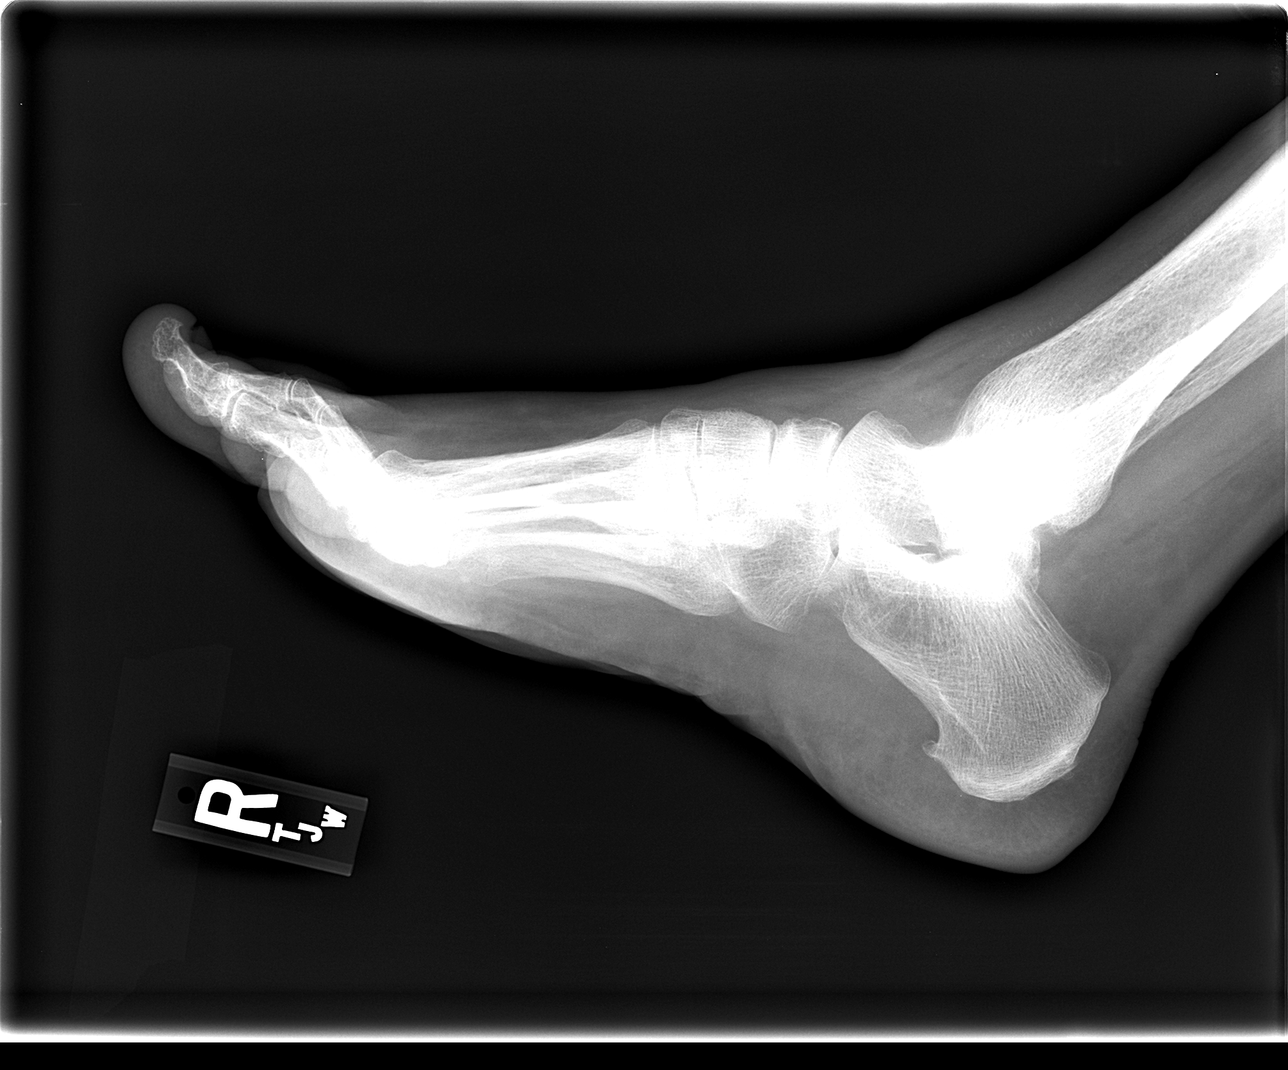

[3 of 3 positions shown; findings below may reference images not displayed]

FINDINGS: Frontal, oblique, and lateral views were obtained. There is no
demonstrable fracture or dislocation. There is moderate narrowing of
the first MTP joint as well as all PIP joints. There is no erosive
change. There is a small inferior calcaneal spur.
IMPRESSION: Narrowing of multiple distal joints. Small inferior calcaneal spur.
No fracture or dislocation.

## 2018-01-10 ENCOUNTER — Other Ambulatory Visit: Payer: Self-pay | Admitting: Cardiovascular Disease

## 2018-01-17 ENCOUNTER — Ambulatory Visit (INDEPENDENT_AMBULATORY_CARE_PROVIDER_SITE_OTHER): Payer: Medicare Other | Admitting: Family Medicine

## 2018-01-17 ENCOUNTER — Encounter: Payer: Self-pay | Admitting: Family Medicine

## 2018-01-17 VITALS — BP 140/80 | HR 53 | Temp 97.5°F | Ht 65.0 in | Wt 195.2 lb

## 2018-01-17 DIAGNOSIS — M25552 Pain in left hip: Secondary | ICD-10-CM | POA: Insufficient documentation

## 2018-01-17 DIAGNOSIS — M2550 Pain in unspecified joint: Secondary | ICD-10-CM

## 2018-01-17 DIAGNOSIS — Z23 Encounter for immunization: Secondary | ICD-10-CM | POA: Diagnosis not present

## 2018-01-17 DIAGNOSIS — R0609 Other forms of dyspnea: Secondary | ICD-10-CM | POA: Diagnosis not present

## 2018-01-17 DIAGNOSIS — I25118 Atherosclerotic heart disease of native coronary artery with other forms of angina pectoris: Secondary | ICD-10-CM

## 2018-01-17 DIAGNOSIS — R8290 Unspecified abnormal findings in urine: Secondary | ICD-10-CM | POA: Diagnosis not present

## 2018-01-17 DIAGNOSIS — M7542 Impingement syndrome of left shoulder: Secondary | ICD-10-CM | POA: Insufficient documentation

## 2018-01-17 DIAGNOSIS — I499 Cardiac arrhythmia, unspecified: Secondary | ICD-10-CM | POA: Diagnosis not present

## 2018-01-17 DIAGNOSIS — R809 Proteinuria, unspecified: Secondary | ICD-10-CM | POA: Diagnosis not present

## 2018-01-17 DIAGNOSIS — E782 Mixed hyperlipidemia: Secondary | ICD-10-CM | POA: Diagnosis not present

## 2018-01-17 DIAGNOSIS — R0600 Dyspnea, unspecified: Secondary | ICD-10-CM

## 2018-01-17 LAB — POCT URINALYSIS DIPSTICK
BILIRUBIN UA: NEGATIVE
Blood, UA: NEGATIVE
Glucose, UA: NEGATIVE
KETONES UA: NEGATIVE
Nitrite, UA: NEGATIVE
PH UA: 6.5 (ref 5.0–8.0)
PROTEIN UA: POSITIVE — AB
Spec Grav, UA: 1.02 (ref 1.010–1.025)
Urobilinogen, UA: 0.2 E.U./dL

## 2018-01-17 LAB — COMPREHENSIVE METABOLIC PANEL
ALK PHOS: 61 U/L (ref 39–117)
ALT: 14 U/L (ref 0–35)
AST: 19 U/L (ref 0–37)
Albumin: 4.4 g/dL (ref 3.5–5.2)
BILIRUBIN TOTAL: 0.5 mg/dL (ref 0.2–1.2)
BUN: 21 mg/dL (ref 6–23)
CO2: 28 meq/L (ref 19–32)
Calcium: 9.7 mg/dL (ref 8.4–10.5)
Chloride: 98 mEq/L (ref 96–112)
Creatinine, Ser: 1 mg/dL (ref 0.40–1.20)
GFR: 57.79 mL/min — ABNORMAL LOW (ref >60.00)
GLUCOSE: 101 mg/dL — AB (ref 70–99)
Potassium: 3.6 mEq/L (ref 3.5–5.1)
Sodium: 137 mEq/L (ref 135–145)
TOTAL PROTEIN: 7.9 g/dL (ref 6.0–8.3)

## 2018-01-17 LAB — CBC
HCT: 38.8 % (ref 36.0–46.0)
Hemoglobin: 13.5 g/dL (ref 12.0–15.0)
MCHC: 34.8 g/dL (ref 30.0–36.0)
MCV: 96.8 fl (ref 78.0–100.0)
Platelets: 263 10*3/uL (ref 150.0–400.0)
RBC: 4.01 Mil/uL (ref 3.87–5.11)
RDW: 12.6 % (ref 11.5–15.5)
WBC: 6.1 10*3/uL (ref 4.0–10.5)

## 2018-01-17 LAB — TSH: TSH: 1.81 u[IU]/mL (ref 0.35–4.50)

## 2018-01-17 MED ORDER — PNEUMOCOCCAL VAC POLYVALENT 25 MCG/0.5ML IJ INJ: 0.5000 mL | INJECTION | Freq: Once | INTRAMUSCULAR | 0 refills | Status: AC

## 2018-01-17 NOTE — Patient Instructions (Signed)
Nice to see you. We will check lab work today and contact you with the results. We will have you do your pneumonia vaccine at the pharmacy. We will get you to do physical therapy for your shoulder and hip.

## 2018-01-17 NOTE — Addendum Note (Signed)
Addended by: Myriam Forehand on: 01/17/2018 04:28 PM   Modules accepted: Orders

## 2018-01-17 NOTE — Assessment & Plan Note (Signed)
Noted on prior urinalysis in the setting of a UTI.  We will recheck today.

## 2018-01-17 NOTE — Assessment & Plan Note (Signed)
Continue current regimen

## 2018-01-17 NOTE — Assessment & Plan Note (Signed)
Refer for physical therapy  

## 2018-01-17 NOTE — Assessment & Plan Note (Signed)
Chronic intermittent issues with this.  Response to flecainide.  She will continue to see cardiology for this.

## 2018-01-17 NOTE — Progress Notes (Signed)
Tommi Rumps, MD Phone: [phone]  Dawn Herring is a 72 y.o. female who presents today for f/u.  CC: arrhythmia, joint pain, hld, doe  Arrhythmia: Patient has chronic issues with this.  She notes only occasionally having issues with feeling palpitations with her heart rate going to the 120s.  She typically has pain in her back between her shoulders with this.  Last occurred over a week ago.  She will take a flecainide when this occurs and it will calm down.  She is taking Inderal half a tablet at night.  No chest pain.  Shortness of breath with these episodes.  Dyspnea on exertion: She notes for the last 6 or so months she has noted more shortness of breath with exertion.  She has not been very active.  She notes no edema.  No cough.  No orthopnea or PND.  Reports it only bothers her if she goes walking for long period of time or if she goes upstairs.  Joint pain: She tried stopping the Zetia though that did not make a difference.  She is back on that.  She continues on Praluent.  She notes her left hip and shoulder bother her the most.  Some stiffness.  Some pain.  She wonders about physical therapy for this.  Hyperlipidemia: No chest pain.  She is taking Zetia and Praluent.  Social History   Tobacco Use  Smoking Status Never Smoker  Smokeless Tobacco Never Used     ROS see history of present illness  Objective  Physical Exam Vitals:   01/17/18 1034  BP: 140/80  Pulse: (!) 53  Temp: (!) 97.5 F (36.4 C)  SpO2: 95%    BP Readings from Last 3 Encounters:  01/17/18 140/80  08/25/17 128/64  07/18/17 122/62   Wt Readings from Last 3 Encounters:  01/17/18 195 lb 3.2 oz (88.5 kg)  08/25/17 191 lb 12.8 oz (87 kg)  07/18/17 190 lb 9.6 oz (86.5 kg)    Physical Exam  Constitutional: No distress.  Cardiovascular: Normal rate, regular rhythm and normal heart sounds.  Pulmonary/Chest: Effort normal and breath sounds normal.  Musculoskeletal: She exhibits no edema.    Bilateral hips with no tenderness, no discomfort on internal or external range of motion, full range of internal and external range of motion, bilateral shoulders with no tenderness, full active and passive range of motion bilateral shoulders, discomfort on active and passive internal rotation of the left shoulder, positive empty can on the left  Neurological: She is alert.  Skin: Skin is warm and dry. She is not diaphoretic.   EKG: Sinus bradycardia, rate 51, negative T waves V2 through V5, similar to prior, no noted ischemic changes  Assessment/Plan: Please see individual problem list.  Arrhythmia Chronic intermittent issues with this.  Response to flecainide.  She will continue to see cardiology for this.  Hyperlipidemia Continue current regimen.  Proteinuria Noted on prior urinalysis in the setting of a UTI.  We will recheck today.  DOE (dyspnea on exertion) This potentially could be an anginal equivalent though could also be deconditioning related as she is not terribly active.  EKG done today.  We will check lab work.  She will see her cardiologist.  If she develops worsening or new symptoms she will be reevaluated.  Left hip pain Suspect osteoarthritis.  Will refer for physical therapy.  Rotator cuff impingement syndrome of left shoulder Refer for physical therapy.    Orders Placed This Encounter  Procedures  . Urine  Culture  . CBC  . Comp Met (CMET)  . TSH  . Urine Microscopic Only  . Ambulatory referral to Physical Therapy    Referral Priority:   Routine    Referral Type:   Physical Medicine    Referral Reason:   Specialty Services Required    Requested Specialty:   Physical Therapy    Number of Visits Requested:   1  . POCT Urinalysis Dipstick  . EKG 12-Lead    Meds ordered this encounter  Medications  . pneumococcal 23 valent vaccine (PNU-IMMUNE) 25 MCG/0.5ML injection    Sig: Inject 0.5 mLs into the muscle once for 1 dose.    Dispense:  0.5 mL     Refill:  0     Tommi Rumps, MD [place]

## 2018-01-17 NOTE — Assessment & Plan Note (Signed)
Suspect osteoarthritis.  Will refer for physical therapy.

## 2018-01-17 NOTE — Assessment & Plan Note (Signed)
This potentially could be an anginal equivalent though could also be deconditioning related as she is not terribly active.  EKG done today.  We will check lab work.  She will see her cardiologist.  If she develops worsening or new symptoms she will be reevaluated.

## 2018-01-19 ENCOUNTER — Telehealth: Payer: Self-pay | Admitting: *Deleted

## 2018-01-19 DIAGNOSIS — R8290 Unspecified abnormal findings in urine: Secondary | ICD-10-CM

## 2018-01-19 NOTE — Telephone Encounter (Signed)
Pt coming in for labs on Monday. Please place future lab order.  (Lab note says urine)  Thanks

## 2018-01-21 NOTE — Telephone Encounter (Signed)
Orders placed.

## 2018-01-22 ENCOUNTER — Other Ambulatory Visit (INDEPENDENT_AMBULATORY_CARE_PROVIDER_SITE_OTHER): Payer: Medicare Other

## 2018-01-22 DIAGNOSIS — R8290 Unspecified abnormal findings in urine: Secondary | ICD-10-CM | POA: Diagnosis not present

## 2018-01-22 LAB — POCT URINALYSIS DIPSTICK
Bilirubin, UA: NEGATIVE
Blood, UA: NEGATIVE
Glucose, UA: NEGATIVE
Ketones, UA: NEGATIVE
LEUKOCYTES UA: NEGATIVE
NITRITE UA: NEGATIVE
PROTEIN UA: NEGATIVE
Spec Grav, UA: 1.015 (ref 1.010–1.025)
UROBILINOGEN UA: 0.2 U/dL
pH, UA: 7.5 (ref 5.0–8.0)

## 2018-01-22 LAB — URINALYSIS, MICROSCOPIC ONLY

## 2018-01-23 LAB — URINE CULTURE
MICRO NUMBER: 91293299
SPECIMEN QUALITY:: ADEQUATE

## 2018-02-07 ENCOUNTER — Ambulatory Visit: Payer: Medicare Other | Attending: Family Medicine

## 2018-02-07 ENCOUNTER — Other Ambulatory Visit: Payer: Self-pay

## 2018-02-07 DIAGNOSIS — M6281 Muscle weakness (generalized): Secondary | ICD-10-CM | POA: Insufficient documentation

## 2018-02-07 DIAGNOSIS — M25512 Pain in left shoulder: Secondary | ICD-10-CM | POA: Insufficient documentation

## 2018-02-07 DIAGNOSIS — M25552 Pain in left hip: Secondary | ICD-10-CM | POA: Insufficient documentation

## 2018-02-07 DIAGNOSIS — G8929 Other chronic pain: Secondary | ICD-10-CM | POA: Diagnosis not present

## 2018-02-07 NOTE — Therapy (Signed)
[facility] [address], [place], [address] Phone: [phone]   Fax:  [phone]  Physical Therapy Evaluation  Patient Details  Name: Dawn Herring MRN: [id number] Date of Birth: [birth year] Referring Provider (PT): Tommi Rumps, MD   Encounter Date: 02/07/2018  PT End of Session - 02/07/18 1429    Visit Number  1    Number of Visits  17    Date for PT Re-Evaluation  04/05/18    Authorization Type  1    Authorization Time Period  of 10 progress report    PT Start Time  1432    PT Stop Time  1552    PT Time Calculation (min)  80 min    Activity Tolerance  Patient tolerated treatment well    Behavior During Therapy  [facility] for tasks assessed/performed       Past Medical History:  Diagnosis Date  . Chest pain    a. Neg stress tests - 2005, 2008, 2009;  b. 08/2014 Cardiac CT: Ca score 193 (80th%'ile).  . Essential hypertension   . Hiatal hernia    a. 08/2014 CT chest: large hiatal hernia.  Marland Kitchen History of DVT (deep vein thrombosis)   . Hyperlipidemia    a. statin intolerant;  b. on zetia and repatha.  . Palpitations    a. 02/2015 Event Monitor: NSR w/ rare, short episodes of A Tach, rare short runs of SVT < 15 beats.    Past Surgical History:  Procedure Laterality Date  . CATARACT EXTRACTION, BILATERAL    . LAPAROSCOPIC APPENDECTOMY N/A 12/02/2015   Procedure: APPENDECTOMY LAPAROSCOPIC;  Surgeon: Clovis Riley, MD;  Location: [facility];  Service: General;  Laterality: N/A;  . ROOT CANAL  2013  . ROOT CANAL    . VAGINAL HYSTERECTOMY     abdominal hysterectomy    There were no vitals filed for this visit.   Subjective Assessment - 02/07/18 1437    Subjective  L shoulder pain (posterior and anterior lateral shoulder; also feels L ring and 5th digit numbness at night). 0/10 currently, 5/10 at most for the past 3 months (rarely takes pain medication, sometimes takes an Advil at night to help her  sleep).  L hip (posterior lateral hip around the piriformis area):  0/10 currently (pt sitting on chair),  7/10 at worst for the past 3 months.     Pertinent History  L shoulder and L hip pain. L shoulder bothers her more which keeps her from sleeping. Difficulty laying in her L side. Difficulty donning and doffing shirts and sweaters. Not sure how much of her pain is related to her cholesterol medicine. Has had reactions to her cholesterol medicines before.   L shoulder pain started about a little over a year ago which has gradually worsened. Also has a difficult time walking after sitting for a while.  L shoulder pain occured gradually. Only noticed L shoulder bothering her sleeping in the past few months.  Has not yet had imaging for her L shoulder and hip.  Pt is R hand dominant.  L hip pain began years ago which disappeared. L hip pain started bothering her consistently for the past 6 months particularly when pt tries to sleep.  Denies unexplained changes in weight.      Patient Stated Goals  Want to be able to keep moving.     Currently in Pain?  No/denies    Pain Score  0-No pain  sitting and resting   Pain Location  --   L shoulder and L hip   Pain Orientation  Left    Pain Type  Chronic pain    Pain Onset  More than a month ago    Pain Frequency  Occasional    Aggravating Factors   L shoulder: donning and doffing shirts, sweaters, jackets, reaching across > reaching up. L hip: L S/L, going up and down steps, standing after sitting for too long,     Pain Relieving Factors  Sitting and resting. L hip: repositioning if in bed, heat. L shoulder: repositioning and not reaching          [facility] PT Assessment - 02/07/18 1433      Assessment   Medical Diagnosis  L shoulder rotator cuff impingement, L hip pain    Referring Provider (PT)  Tommi Rumps, MD    Onset Date/Surgical Date  01/17/18    Hand Dominance  Right    Prior Therapy  no known PT for current condition      Precautions    Precaution Comments  no known precautions      Restrictions   Other Position/Activity Restrictions  no known restrictions      Balance Screen   Has the patient fallen in the past 6 months  No    Has the patient had a decrease in activity level because of a fear of falling?   No    Is the patient reluctant to leave their home because of a fear of falling?   No      Home Environment   Additional Comments  1 step to enter garage with a rail on the wall. Grab bars near the commode. Pt lives in a 1 story home with a basement (pt rarely goes down to the basement). Lives with husband. 15-17 steps to get to the basement R rail when going up the basement steps.        Observation/Other Assessments   Observations  (+) Hawkins-Kennedy test, (-) empty can, (+) Neer's impingement L shoulder.  Reproduction of posterior shoulder pain with L fist behind shoulder with L shoulder flexion/abductoin/ER.    Supine L shoulder ER isometrics: pain with axial compress, less pain with axial distraction.  (+) step down test, decreased femoral control, reproduction of L hip pain.     Focus on Therapeutic Outcomes (FOTO)   L shoulder FOTO: 55    Quick DASH   29.5%      Posture/Postural Control   Posture Comments  protracted neck, B protracted shoulders, R shoulder lower, decreased lumbar lordosis, L iliac crest higher, B genu valgus R > L       AROM   Right Shoulder Flexion  145 Degrees   155 AAROM   Right Shoulder ABduction  180 Degrees    Left Shoulder Flexion  141 Degrees   155 AAROM, L post shoulder pain when returning to neutral   Left Shoulder ABduction  160 Degrees   177 AAROM. Anterior shoulder pain when returning to neutral    Left Shoulder Internal Rotation  --   functional IR: fingers to R scapula   Left Shoulder External Rotation  --   Functional ER: fingers to R rhomboid   Cervical Flexion  [facility]    Cervical Extension  [facility]    Cervical - Right Side Bend  [facility]    Cervical - Left Side Bend  [facility]     Cervical - Right Rotation  [facility]  Cervical - Left Rotation  [facility]    Lumbar Flexion  [facility]    Lumbar Extension  [facility]    Lumbar - Right Side Bend  [facility]    Lumbar - Left Side Bend  [facility]    Lumbar - Right Rotation  [facility]   performed in sitting   Lumbar - Left Rotation  WFL   performed in sitting     Strength   Right Shoulder Flexion  5/5    Right Shoulder ABduction  4+/5    Right Shoulder Internal Rotation  5/5    Right Shoulder External Rotation  4/5    Left Shoulder Flexion  4+/5    Left Shoulder ABduction  4/5    Left Shoulder Internal Rotation  4+/5    Left Shoulder External Rotation  4-/5   with reproduction of pain   Right Hip Flexion  4-/5    Right Hip Extension  3+/5   seated manually resisted   Right Hip ABduction  4/5   seated manually resisted clamshell   Left Hip Flexion  4-/5    Left Hip Extension  4-/5   seated manually resisted   Left Hip ABduction  4/5   seated manually resisted clamshell   Right Knee Flexion  4+/5    Right Knee Extension  4+/5    Left Knee Flexion  4/5    Left Knee Extension  5/5      Palpation   Palpation comment  TTP L piriformis muscle area.       Ambulation/Gait   Gait Comments  antalgic, decreased stance L LE with L femoral IR and adduction during stance phase of gait.                 Objective measurements completed on examination: See above findings .  Denies personal history of CA or osteoporosis, no pacemakers or defibrillators. Denies latex band allergies      Medbridge Access Code: [facility]   Therapeutic exercise  L shoulder ER isometrics in neutral with 3 lbs ankle weight at distal arm  10x5 seconds with sub max pain-free effort for 3 sets  Pain with shoulder flexion but no pain with returning to neutral afterwards  Yellow band scapular retraction 10x  Then 10x5 seconds for 2 sets. No pain with shoulder flexion 2x afterwards    Reviewed and given as part of her HEP. Pt demonstrated and verbalized understanding.  Handout provided.    Ascending and descending 4 regular steps with B UE assist 3x. Decreased L hip pain with femoral control   Improved exercise technique, movement at target joints, use of target muscles after mod verbal, visual, tactile cues.   Patient is a 72 year old female who came to physical therapy secondary to L shoulder and L hip pain. She also presents with altered gait pattern and posture, scapular and infraspinatus weakness, B glute med and max weakness, decreased femoral control, positive special tests suggesting impingement in L shoulder, and difficulty performing functional tasks such as donning and doffing shirts and sweaters as well as with stair negotiation. Patient will benefit from skilled physical therapy services to address the aforementioned deficits.              PT Education - 02/07/18 1944    Education Details  ther-ex, HEP, plan of care    Person(s) Educated  Patient    Methods  Explanation;Demonstration;Tactile cues;Verbal cues;Handout    Comprehension  Returned demonstration;Verbalized understanding  PT Short Term Goals - 02/07/18 1613      PT SHORT TERM GOAL #1   Title  Patient will be independent with her HEP to decrease L shoulder and hip pain, improve strength and function.     Baseline  Pt has started her HEP (02/07/2018)     Time  3    Period  Weeks    Status  New    Target Date  03/01/18        PT Long Term Goals - 02/07/18 1615      PT LONG TERM GOAL #1   Title  Patient will have a decrease in L shoulder pain to 2/10 or less at worst to promote ability to don and doff her shirts and sweaters, as well as sleep on her L side.     Baseline  5/10 L shoulder pain at most for the past 3 months (02/07/2018)    Time  8    Period  Weeks    Status  New    Target Date  04/05/18      PT LONG TERM GOAL #2   Title  Patient will have a decrease in L hip pain to 3/10 or less at worst to promote ability to perform standing tasks such as  stair negotiation.     Baseline  7/10 L hip pain at most for the past 3 months (02/07/2018)    Time  8    Period  Weeks    Status  New    Target Date  04/05/18      PT LONG TERM GOAL #3   Title  Patient will improve L shoulder ER strength by at least 1/2 MMT grade to promote ability to raise her arm and use it to perform functional tasks more comfortably.     Baseline  L shoulder ER 4-/5 (02/07/2018)    Time  8    Period  Weeks    Status  New    Target Date  04/05/18      PT LONG TERM GOAL #4   Title  Patient will improve L glute med and max strength by at least 1/2 MMT grade to promote ability to perform standing tasks such as stair negotiation more comfortably for her L hip.     Baseline  L hip extension 4-/5 (seated manually resisted), hip abduction 4/5 (seated manually resisted clamshell isometrics) 02/07/2018.     Time  8    Period  Weeks    Status  New    Target Date  04/05/18      PT LONG TERM GOAL #5   Title  Patient will improve her L shoulder FOTO score by at least 10 points as a demonstration of improved function.     Baseline  L shoulder FOTO 55 (02/07/2018)    Time  8    Period  Weeks    Status  New    Target Date  04/05/18      Additional Long Term Goals   Additional Long Term Goals  Yes      PT LONG TERM GOAL #6   Title  Patient will improve her Quick Dash score by at least 10% as a demonstration of improved function.     Baseline  29.5%  (02/07/2018)    Time  8    Period  Weeks    Status  New    Target Date  04/05/18  Plan - 02/07/18 1601    Clinical Impression Statement  Patient is a 72 year old female who came to physical therapy secondary to L shoulder and L hip pain. She also presents with altered gait pattern and posture, scapular and infraspinatus weakness, B glute med and max weakness, decreased femoral control, positive special tests suggesting impingement in L shoulder, and difficulty performing functional tasks such as donning  and doffing shirts and sweaters as well as with stair negotiation. Patient will benefit from skilled physical therapy services to address the aforementioned deficits.     History and Personal Factors relevant to plan of care:  Chronicity of condition, multiple areas of pain (L shoulder, L hip), weakness, decreased scapular and femoral control, difficulty donning and doffing shirts and sweaters, difficulty with stair negotiation as well as walking after prolonged sitting.     Clinical Presentation  Evolving    Clinical Presentation due to:  Pt states L shoulder pain has gradually worsened.     Clinical Decision Making  Moderate    Rehab Potential  Fair    Clinical Impairments Affecting Rehab Potential  (-) Chronicity of condition, weakness, multiple areas of pain, age; (+) motivated    PT Frequency  2x / week    PT Duration  8 weeks    PT Treatment/Interventions  Therapeutic activities;Therapeutic exercise;Neuromuscular re-education;Patient/family education;Manual techniques;Dry needling;Aquatic Therapy;Electrical Stimulation;Iontophoresis 4mg /ml Dexamethasone;Ultrasound    PT Next Visit Plan  scapular, glute med, max, trunk strengthening, gentle infraspinatus muscle strengthening, manual techniques, modalities PRN    Consulted and Agree with Plan of Care  Patient       Patient will benefit from skilled therapeutic intervention in order to improve the following deficits and impairments:  Pain, Postural dysfunction, Improper body mechanics, Impaired UE functional use, Decreased strength, Decreased range of motion, Abnormal gait  Visit Diagnosis: Chronic left shoulder pain - Plan: PT plan of care cert/re-cert  Pain in left hip - Plan: PT plan of care cert/re-cert  Muscle weakness (generalized) - Plan: PT plan of care cert/re-cert     Problem List Patient Active Problem List   Diagnosis Date Noted  . DOE (dyspnea on exertion) 01/17/2018  . Proteinuria 01/17/2018  . Left hip pain  01/17/2018  . Rotator cuff impingement syndrome of left shoulder 01/17/2018  . Myalgia 07/18/2017  . Neuropathy 01/13/2017  . Arrhythmia   . Palpitations   . Essential hypertension   . Right foot pain 03/17/2015  . Non-alcoholic fatty liver disease 03/03/2015  . Gallbladder attack 01/28/2015  . Thyroid nodule 01/26/2015  . CAD (coronary artery disease) 01/26/2015  . Tachycardia 07/30/2014  . Foot swelling 10/30/2012  . Obesity 09/05/2010  . Hyperlipidemia 04/14/2010   Joneen Boers PT, DPT   02/07/2018, 7:53 PM  [facility] [address], [place], [address] Phone: [phone]   Fax:  [phone]  Name: Dawn Herring MRN: [id number] Date of Birth: Jul 28, 1945

## 2018-02-07 NOTE — Patient Instructions (Signed)
Medbridge Access Code: [facility]  Scapular Retraction with Resistance  2-3x10 with 5 second holds yellow band

## 2018-02-13 ENCOUNTER — Ambulatory Visit: Payer: Medicare Other

## 2018-02-13 DIAGNOSIS — M25512 Pain in left shoulder: Principal | ICD-10-CM

## 2018-02-13 DIAGNOSIS — G8929 Other chronic pain: Secondary | ICD-10-CM

## 2018-02-13 DIAGNOSIS — M25552 Pain in left hip: Secondary | ICD-10-CM

## 2018-02-13 DIAGNOSIS — M6281 Muscle weakness (generalized): Secondary | ICD-10-CM

## 2018-02-13 NOTE — Therapy (Signed)
[facility] [address], [place], [address] Phone: [phone]   Fax:  [phone]  Physical Therapy Treatment  Patient Details  Name: Dawn Herring MRN: [id number] Date of Birth: [birth year] Referring Provider (PT): Tommi Rumps, MD   Encounter Date: 02/13/2018  PT End of Session - 02/13/18 1446    Visit Number  2    Number of Visits  17    Date for PT Re-Evaluation  04/05/18    Authorization Type  2    Authorization Time Period  of 10 progress report    PT Start Time  1446    PT Stop Time  1529    PT Time Calculation (min)  43 min    Activity Tolerance  Patient tolerated treatment well    Behavior During Therapy  [facility] for tasks assessed/performed       Past Medical History:  Diagnosis Date  . Chest pain    a. Neg stress tests - 2005, 2008, 2009;  b. 08/2014 Cardiac CT: Ca score 193 (80th%'ile).  . Essential hypertension   . Hiatal hernia    a. 08/2014 CT chest: large hiatal hernia.  Marland Kitchen History of DVT (deep vein thrombosis)   . Hyperlipidemia    a. statin intolerant;  b. on zetia and repatha.  . Palpitations    a. 02/2015 Event Monitor: NSR w/ rare, short episodes of A Tach, rare short runs of SVT < 15 beats.    Past Surgical History:  Procedure Laterality Date  . CATARACT EXTRACTION, BILATERAL    . LAPAROSCOPIC APPENDECTOMY N/A 12/02/2015   Procedure: APPENDECTOMY LAPAROSCOPIC;  Surgeon: Clovis Riley, MD;  Location: [place];  Service: General;  Laterality: N/A;  . ROOT CANAL  2013  . ROOT CANAL    . VAGINAL HYSTERECTOMY     abdominal hysterectomy    There were no vitals filed for this visit.  Subjective Assessment - 02/13/18 1448    Subjective  L shoulder tenderness 2/10 currently. L hip has been better. Has been concentrating on keeping her thighs netural when going up and down the stairs. No L hip pain currently     Pertinent History  L shoulder and L hip pain. L shoulder bothers her  more which keeps her from sleeping. Difficulty laying in her L side. Difficulty donning and doffing shirts and sweaters. Not sure how much of her pain is related to her cholesterol medicine. Has had reactions to her cholesterol medicines before.   L shoulder pain started about a little over a year ago which has gradually worsened. Also has a difficult time walking after sitting for a while.  L shoulder pain occured gradually. Only noticed L shoulder bothering her sleeping in the past few months.  Has not yet had imaging for her L shoulder and hip.  Pt is R hand dominant.  L hip pain began years ago which disappeared. L hip pain started bothering her consistently for the past 6 months particularly when pt tries to sleep.  Denies unexplained changes in weight.      Patient Stated Goals  Want to be able to keep moving.     Currently in Pain?  Yes    Pain Score  2     Pain Onset  More than a month ago  PT Education - 02/13/18 1500    Education Details  ther-ex    Person(s) Educated  Patient    Methods  Explanation;Demonstration;Tactile cues;Verbal cues    Comprehension  Returned demonstration;Verbalized understanding       Objective measurements completed on examination: See above findings .  Denies personal history of CA or osteoporosis, no pacemakers or defibrillators. Denies latex band allergies      Medbridge Access Code: [facility]   Therapeutic exercise   Yellow band scapular retraction 10x             Then 10x5 seconds for 2 sets.   No pain with L shoulder flexion   Chin tucks 10x5 seconds to promote thoracic extension and therefore scapular retraction for 2 sets   L shoulder ER isometrics resisting yellow band with L small side step to the L 5x3   L shoulder IR resisting yellow band 5x3  L shoulder extension resisting yellow band 10x3  Manually resisted scapular retraction targeting the lower trap muscle  L 10x5  seconds for 3 sets    Standing L LE leg press resisting double blue band 10x3 with B UE assist   Side stepping resisting yellow band on thighs 32 ft to the R and 32 ft to the L.   Standing L hip extension resisting red band with B UE assist 10x3   Improved exercise technique, movement at target joints, use of target muscles after min to mod verbal, visual, tactile cues.   No L shoulder pain with flexion following exercises to promote scapular retraction and use of ER muscles. Continued working on glute med and max strengthening to promote femoral control with closed chain tasks and therefore decrease L hip pain.            PT Short Term Goals - 02/07/18 1613      PT SHORT TERM GOAL #1   Title  Patient will be independent with her HEP to decrease L shoulder and hip pain, improve strength and function.     Baseline  Pt has started her HEP (02/07/2018)     Time  3    Period  Weeks    Status  New    Target Date  03/01/18        PT Long Term Goals - 02/07/18 1615      PT LONG TERM GOAL #1   Title  Patient will have a decrease in L shoulder pain to 2/10 or less at worst to promote ability to don and doff her shirts and sweaters, as well as sleep on her L side.     Baseline  5/10 L shoulder pain at most for the past 3 months (02/07/2018)    Time  8    Period  Weeks    Status  New    Target Date  04/05/18      PT LONG TERM GOAL #2   Title  Patient will have a decrease in L hip pain to 3/10 or less at worst to promote ability to perform standing tasks such as stair negotiation.     Baseline  7/10 L hip pain at most for the past 3 months (02/07/2018)    Time  8    Period  Weeks    Status  New    Target Date  04/05/18      PT LONG TERM GOAL #3   Title  Patient will improve L shoulder ER strength by at least 1/2 MMT grade to promote  ability to raise her arm and use it to perform functional tasks more comfortably.     Baseline  L shoulder ER 4-/5 (02/07/2018)    Time  8     Period  Weeks    Status  New    Target Date  04/05/18      PT LONG TERM GOAL #4   Title  Patient will improve L glute med and max strength by at least 1/2 MMT grade to promote ability to perform standing tasks such as stair negotiation more comfortably for her L hip.     Baseline  L hip extension 4-/5 (seated manually resisted), hip abduction 4/5 (seated manually resisted clamshell isometrics) 02/07/2018.     Time  8    Period  Weeks    Status  New    Target Date  04/05/18      PT LONG TERM GOAL #5   Title  Patient will improve her L shoulder FOTO score by at least 10 points as a demonstration of improved function.     Baseline  L shoulder FOTO 55 (02/07/2018)    Time  8    Period  Weeks    Status  New    Target Date  04/05/18      Additional Long Term Goals   Additional Long Term Goals  Yes      PT LONG TERM GOAL #6   Title  Patient will improve her Quick Dash score by at least 10% as a demonstration of improved function.     Baseline  29.5%  (02/07/2018)    Time  8    Period  Weeks    Status  New    Target Date  04/05/18            Plan - 02/13/18 1505    Clinical Impression Statement  No L shoulder pain with flexion following exercises to promote scapular retraction and use of ER muscles. Continued working on glute med and max strengthening to promote femoral control with closed chain tasks and therefore decrease L hip pain.     Rehab Potential  Fair    Clinical Impairments Affecting Rehab Potential  (-) Chronicity of condition, weakness, multiple areas of pain, age; (+) motivated    PT Frequency  2x / week    PT Duration  8 weeks    PT Treatment/Interventions  Therapeutic activities;Therapeutic exercise;Neuromuscular re-education;Patient/family education;Manual techniques;Dry needling;Aquatic Therapy;Electrical Stimulation;Iontophoresis 4mg /ml Dexamethasone;Ultrasound    PT Next Visit Plan  scapular, glute med, max, trunk strengthening, gentle infraspinatus muscle  strengthening, manual techniques, modalities PRN    Consulted and Agree with Plan of Care  Patient       Patient will benefit from skilled therapeutic intervention in order to improve the following deficits and impairments:  Pain, Postural dysfunction, Improper body mechanics, Impaired UE functional use, Decreased strength, Decreased range of motion, Abnormal gait  Visit Diagnosis: Chronic left shoulder pain  Pain in left hip  Muscle weakness (generalized)     Problem List Patient Active Problem List   Diagnosis Date Noted  . DOE (dyspnea on exertion) 01/17/2018  . Proteinuria 01/17/2018  . Left hip pain 01/17/2018  . Rotator cuff impingement syndrome of left shoulder 01/17/2018  . Myalgia 07/18/2017  . Neuropathy 01/13/2017  . Arrhythmia   . Palpitations   . Essential hypertension   . Right foot pain 03/17/2015  . Non-alcoholic fatty liver disease 03/03/2015  . Gallbladder attack 01/28/2015  . Thyroid nodule 01/26/2015  .  CAD (coronary artery disease) 01/26/2015  . Tachycardia 07/30/2014  . Foot swelling 10/30/2012  . Obesity 09/05/2010  . Hyperlipidemia 04/14/2010    Joneen Boers PT, DPT   02/13/2018, 3:41 PM  [facility] [address] [place], [address] Phone: [phone]   Fax:  [phone]  Name: Dawn Herring MRN: [id number] Date of Birth: [birth year]

## 2018-02-15 ENCOUNTER — Ambulatory Visit: Payer: Medicare Other

## 2018-02-15 DIAGNOSIS — G8929 Other chronic pain: Secondary | ICD-10-CM

## 2018-02-15 DIAGNOSIS — M6281 Muscle weakness (generalized): Secondary | ICD-10-CM

## 2018-02-15 DIAGNOSIS — M25552 Pain in left hip: Secondary | ICD-10-CM | POA: Diagnosis not present

## 2018-02-15 DIAGNOSIS — M25512 Pain in left shoulder: Principal | ICD-10-CM

## 2018-02-15 NOTE — Therapy (Signed)
[facility] [address] [place], [address] Phone: [phone]   Fax:  [phone]  Physical Therapy Treatment  Patient Details  Name: Dawn Herring MRN: [id number] Date of Birth: 1945-10-10 Referring Provider (PT): Tommi Rumps, MD   Encounter Date: 02/15/2018  PT End of Session - 02/15/18 1349    Visit Number  3    Number of Visits  17    Date for PT Re-Evaluation  04/05/18    Authorization Type  3    Authorization Time Period  of 10 progress report    PT Start Time  4970    PT Stop Time  1436    PT Time Calculation (min)  47 min    Activity Tolerance  Patient tolerated treatment well    Behavior During Therapy  [facility] for tasks assessed/performed       Past Medical History:  Diagnosis Date  . Chest pain    a. Neg stress tests - 2005, 2008, 2009;  b. 08/2014 Cardiac CT: Ca score 193 (80th%'ile).  . Essential hypertension   . Hiatal hernia    a. 08/2014 CT chest: large hiatal hernia.  Marland Kitchen History of DVT (deep vein thrombosis)   . Hyperlipidemia    a. statin intolerant;  b. on zetia and repatha.  . Palpitations    a. 02/2015 Event Monitor: NSR w/ rare, short episodes of A Tach, rare short runs of SVT < 15 beats.    Past Surgical History:  Procedure Laterality Date  . CATARACT EXTRACTION, BILATERAL    . LAPAROSCOPIC APPENDECTOMY N/A 12/02/2015   Procedure: APPENDECTOMY LAPAROSCOPIC;  Surgeon: Clovis Riley, MD;  Location: [place];  Service: General;  Laterality: N/A;  . ROOT CANAL  2013  . ROOT CANAL    . VAGINAL HYSTERECTOMY     abdominal hysterectomy    There were no vitals filed for this visit.  Subjective Assessment - 02/15/18 1351    Subjective  L shoulder is ok today. Has not done too much this morning. Feels pain when she pulls her clothes up over her head, brushing her hair. All and all her L shoulder feels better.  L hip is ok.  Able to go up and down steps this morning and her L hip  is ok.     Pertinent History  L shoulder and L hip pain. L shoulder bothers her more which keeps her from sleeping. Difficulty laying in her L side. Difficulty donning and doffing shirts and sweaters. Not sure how much of her pain is related to her cholesterol medicine. Has had reactions to her cholesterol medicines before.   L shoulder pain started about a little over a year ago which has gradually worsened. Also has a difficult time walking after sitting for a while.  L shoulder pain occured gradually. Only noticed L shoulder bothering her sleeping in the past few months.  Has not yet had imaging for her L shoulder and hip.  Pt is R hand dominant.  L hip pain began years ago which disappeared. L hip pain started bothering her consistently for the past 6 months particularly when pt tries to sleep.  Denies unexplained changes in weight.      Patient Stated Goals  Want to be able to keep moving.     Currently in Pain?  No/denies    Pain Score  0-No pain    Pain Onset  More than a month ago  PT Education - 02/15/18 1355    Education Details  ther-ex    Person(s) Educated  Patient    Methods  Explanation;Demonstration;Tactile cues;Verbal cues    Comprehension  Returned demonstration;Verbalized understanding       Objectives  Medbridge Access Code: [facility]  Therapeutic exercise  L anterior lateral shoulder discomfort with end range flexion   Yellow band L shoulder ER 10x  Yellow band scapular retraction 10x5 seconds for 2 sets.               L shoulder IR resisting yellow band 10x2  Standing L LE leg press resisting double blue band 10x3 with B UE assist to promote glute max muscle strengthening.   L shoulder extension resisting yellow band 10x3  Chin tucks 10x5 seconds to promote thoracic extension and therefore scapular retraction for 2 sets   Side stepping resisting yellow band on thighs 32 ft to the R and 32 ft to the  L.   Manually resisted scapular retraction targeting the lower trap muscle             L 10x5 seconds for 3 sets     Improved exercise technique, movement at target joints, use of target muscles after mod verbal, visual, tactile cues.    Continued working on scapular, ER and IR strengthening to promote better glenohumeral movement when raising her L arm up. Pt continues to have no L shoulder pain with shoulder flexion. Still demonstrates anterior lateral shoulder discomfort when moving her L arm up as if to don or doff a sweater but feels a little better after session per pt. Also continued working on glute max and med muscle strengthening to promote femoral control and decrease L hip pain when performing standing tasks, going up and down steps. Pt tolerated session well without aggravation of symptoms.               PT Short Term Goals - 02/07/18 1613      PT SHORT TERM GOAL #1   Title  Patient will be independent with her HEP to decrease L shoulder and hip pain, improve strength and function.     Baseline  Pt has started her HEP (02/07/2018)     Time  3    Period  Weeks    Status  New    Target Date  03/01/18        PT Long Term Goals - 02/07/18 1615      PT LONG TERM GOAL #1   Title  Patient will have a decrease in L shoulder pain to 2/10 or less at worst to promote ability to don and doff her shirts and sweaters, as well as sleep on her L side.     Baseline  5/10 L shoulder pain at most for the past 3 months (02/07/2018)    Time  8    Period  Weeks    Status  New    Target Date  04/05/18      PT LONG TERM GOAL #2   Title  Patient will have a decrease in L hip pain to 3/10 or less at worst to promote ability to perform standing tasks such as stair negotiation.     Baseline  7/10 L hip pain at most for the past 3 months (02/07/2018)    Time  8    Period  Weeks    Status  New    Target Date  04/05/18      PT LONG  TERM GOAL #3   Title  Patient will improve L  shoulder ER strength by at least 1/2 MMT grade to promote ability to raise her arm and use it to perform functional tasks more comfortably.     Baseline  L shoulder ER 4-/5 (02/07/2018)    Time  8    Period  Weeks    Status  New    Target Date  04/05/18      PT LONG TERM GOAL #4   Title  Patient will improve L glute med and max strength by at least 1/2 MMT grade to promote ability to perform standing tasks such as stair negotiation more comfortably for her L hip.     Baseline  L hip extension 4-/5 (seated manually resisted), hip abduction 4/5 (seated manually resisted clamshell isometrics) 02/07/2018.     Time  8    Period  Weeks    Status  New    Target Date  04/05/18      PT LONG TERM GOAL #5   Title  Patient will improve her L shoulder FOTO score by at least 10 points as a demonstration of improved function.     Baseline  L shoulder FOTO 55 (02/07/2018)    Time  8    Period  Weeks    Status  New    Target Date  04/05/18      Additional Long Term Goals   Additional Long Term Goals  Yes      PT LONG TERM GOAL #6   Title  Patient will improve her Quick Dash score by at least 10% as a demonstration of improved function.     Baseline  29.5%  (02/07/2018)    Time  8    Period  Weeks    Status  New    Target Date  04/05/18            Plan - 02/15/18 1359    Clinical Impression Statement  Continued working on scapular, ER and IR strengthening to promote better glenohumeral movement when raising her L arm up. Pt continues to have no L shoulder pain with shoulder flexion. Still demonstrates anterior lateral shoulder discomfort when moving her L arm up as if to don or doff a sweater but feels a little better after session per pt. Also continued working on glute max and med muscle strengthening to promote femoral control and decrease L hip pain when performing standing tasks, going up and down steps. Pt tolerated session well without aggravation of symptoms.     Rehab Potential   Fair    Clinical Impairments Affecting Rehab Potential  (-) Chronicity of condition, weakness, multiple areas of pain, age; (+) motivated    PT Frequency  2x / week    PT Duration  8 weeks    PT Treatment/Interventions  Therapeutic activities;Therapeutic exercise;Neuromuscular re-education;Patient/family education;Manual techniques;Dry needling;Aquatic Therapy;Electrical Stimulation;Iontophoresis 4mg /ml Dexamethasone;Ultrasound    PT Next Visit Plan  scapular, glute med, max, trunk strengthening, gentle infraspinatus muscle strengthening, manual techniques, modalities PRN    Consulted and Agree with Plan of Care  Patient       Patient will benefit from skilled therapeutic intervention in order to improve the following deficits and impairments:  Pain, Postural dysfunction, Improper body mechanics, Impaired UE functional use, Decreased strength, Decreased range of motion, Abnormal gait  Visit Diagnosis: Chronic left shoulder pain  Pain in left hip  Muscle weakness (generalized)     Problem List Patient Active Problem List  Diagnosis Date Noted  . DOE (dyspnea on exertion) 01/17/2018  . Proteinuria 01/17/2018  . Left hip pain 01/17/2018  . Rotator cuff impingement syndrome of left shoulder 01/17/2018  . Myalgia 07/18/2017  . Neuropathy 01/13/2017  . Arrhythmia   . Palpitations   . Essential hypertension   . Right foot pain 03/17/2015  . Non-alcoholic fatty liver disease 03/03/2015  . Gallbladder attack 01/28/2015  . Thyroid nodule 01/26/2015  . CAD (coronary artery disease) 01/26/2015  . Tachycardia 07/30/2014  . Foot swelling 10/30/2012  . Obesity 09/05/2010  . Hyperlipidemia 04/14/2010    Joneen Boers PT, DPT   02/15/2018, 2:44 PM  [facility] [address], [place], [address] Phone: [phone]   Fax:  [phone]  Name: Dawn Herring MRN: [id number] Date of Birth: Jan 09, 1946

## 2018-02-20 ENCOUNTER — Ambulatory Visit: Payer: Medicare Other

## 2018-02-20 DIAGNOSIS — M25512 Pain in left shoulder: Secondary | ICD-10-CM | POA: Diagnosis not present

## 2018-02-20 DIAGNOSIS — G8929 Other chronic pain: Secondary | ICD-10-CM

## 2018-02-20 DIAGNOSIS — M25552 Pain in left hip: Secondary | ICD-10-CM

## 2018-02-20 DIAGNOSIS — M6281 Muscle weakness (generalized): Secondary | ICD-10-CM | POA: Diagnosis not present

## 2018-02-20 NOTE — Patient Instructions (Signed)
Medbridge Access Code: [facility]   Seated Shoulder Inferior Glide  10x10 seconds for 2x daily

## 2018-02-20 NOTE — Therapy (Signed)
[facility] [address], [place], [address] Phone: [phone]   Fax:  [phone]  Physical Therapy Treatment  Patient Details  Name: Dawn Herring MRN: [id number] Date of Birth: 05/11/1945 Referring Provider (PT): Tommi Rumps, MD   Encounter Date: 02/20/2018  PT End of Session - 02/20/18 1347    Visit Number  4    Number of Visits  17    Date for PT Re-Evaluation  04/05/18    Authorization Type  4    Authorization Time Period  of 10 progress report    PT Start Time  7654    PT Stop Time  1430    PT Time Calculation (min)  42 min    Activity Tolerance  Patient tolerated treatment well    Behavior During Therapy  [facility] for tasks assessed/performed       Past Medical History:  Diagnosis Date  . Chest pain    a. Neg stress tests - 2005, 2008, 2009;  b. 08/2014 Cardiac CT: Ca score 193 (80th%'ile).  . Essential hypertension   . Hiatal hernia    a. 08/2014 CT chest: large hiatal hernia.  Marland Kitchen History of DVT (deep vein thrombosis)   . Hyperlipidemia    a. statin intolerant;  b. on zetia and repatha.  . Palpitations    a. 02/2015 Event Monitor: NSR w/ rare, short episodes of A Tach, rare short runs of SVT < 15 beats.    Past Surgical History:  Procedure Laterality Date  . CATARACT EXTRACTION, BILATERAL    . LAPAROSCOPIC APPENDECTOMY N/A 12/02/2015   Procedure: APPENDECTOMY LAPAROSCOPIC;  Surgeon: Clovis Riley, MD;  Location: [facility];  Service: General;  Laterality: N/A;  . ROOT CANAL  2013  . ROOT CANAL    . VAGINAL HYSTERECTOMY     abdominal hysterectomy    There were no vitals filed for this visit.  Subjective Assessment - 02/20/18 1349    Subjective  L shoulder is pretty good today. Has not done anything except get dressed. No pain currently. The shoulder pain when donning and doffing shirts does not occur as much.  Still feels L anterior shoulder pain when driving with her L hand.  L hip is  doing ok. No pain or discomfort. Has been walking side ways.  2-3/10 L hip pain (its better than her arm), 3-4/10 L shoulder pain at most for the past 7 days.     Pertinent History  L shoulder and L hip pain. L shoulder bothers her more which keeps her from sleeping. Difficulty laying in her L side. Difficulty donning and doffing shirts and sweaters. Not sure how much of her pain is related to her cholesterol medicine. Has had reactions to her cholesterol medicines before.   L shoulder pain started about a little over a year ago which has gradually worsened. Also has a difficult time walking after sitting for a while.  L shoulder pain occured gradually. Only noticed L shoulder bothering her sleeping in the past few months.  Has not yet had imaging for her L shoulder and hip.  Pt is R hand dominant.  L hip pain began years ago which disappeared. L hip pain started bothering her consistently for the past 6 months particularly when pt tries to sleep.  Denies unexplained changes in weight.      Patient Stated Goals  Want to be able to keep moving.     Currently in Pain?  No/denies   at rest   Pain Score  0-No pain    Pain Onset  More than a month ago                               PT Education - 02/20/18 1402    Education Details  ther-ex    Person(s) Educated  Patient    Methods  Explanation;Demonstration;Tactile cues;Verbal cues    Comprehension  Returned demonstration;Verbalized understanding      Objectives   Medbridge Access Code: [facility]  Therapeutic exercise  Standing L cross arm stretch with towel roll under arm pit 30 seconds x 3 Seated L shoulder self inferior glide 10x10 seconds  Decreased L shoulder pain with donning and doffing shirt motion.   L shoulder IR resisting red band 10x3  Yellow band L shoulder ER 10x3  Standing bilateral scapular retraction resisting green band 10x5 seconds for 3 sets   Chin tucks 10x5 seconds to promote thoracic  extension and therefore scapular retraction for 3 sets   L shoulder extension resisting red band 10x2   Standing L LE leg press resisting double blue band 10x3 with B UE assist to promote glute max muscle strengthening.   Improved exercise technique, movement at target joints, use of target muscles after min to mod verbal, visual, tactile cues.    Decreased L anterior shoulder pain with donning and doffing shirt motion following exercise to promote inferior glide L shoulder joint. Continued working on scapular and IR and ER muscle strengthening as well as upper thoracic extension to help decrease pressure from her acromion and coracoid processes. Also continued working on glute strengthening to help promote femoral control and decrease hip pain with standing tasks. Pt making progress towards pain goals based on subjective reports.           PT Short Term Goals - 02/07/18 1613      PT SHORT TERM GOAL #1   Title  Patient will be independent with her HEP to decrease L shoulder and hip pain, improve strength and function.     Baseline  Pt has started her HEP (02/07/2018)     Time  3    Period  Weeks    Status  New    Target Date  03/01/18        PT Long Term Goals - 02/07/18 1615      PT LONG TERM GOAL #1   Title  Patient will have a decrease in L shoulder pain to 2/10 or less at worst to promote ability to don and doff her shirts and sweaters, as well as sleep on her L side.     Baseline  5/10 L shoulder pain at most for the past 3 months (02/07/2018)    Time  8    Period  Weeks    Status  New    Target Date  04/05/18      PT LONG TERM GOAL #2   Title  Patient will have a decrease in L hip pain to 3/10 or less at worst to promote ability to perform standing tasks such as stair negotiation.     Baseline  7/10 L hip pain at most for the past 3 months (02/07/2018)    Time  8    Period  Weeks    Status  New    Target Date  04/05/18      PT LONG TERM GOAL #3  Title   Patient will improve L shoulder ER strength by at least 1/2 MMT grade to promote ability to raise her arm and use it to perform functional tasks more comfortably.     Baseline  L shoulder ER 4-/5 (02/07/2018)    Time  8    Period  Weeks    Status  New    Target Date  04/05/18      PT LONG TERM GOAL #4   Title  Patient will improve L glute med and max strength by at least 1/2 MMT grade to promote ability to perform standing tasks such as stair negotiation more comfortably for her L hip.     Baseline  L hip extension 4-/5 (seated manually resisted), hip abduction 4/5 (seated manually resisted clamshell isometrics) 02/07/2018.     Time  8    Period  Weeks    Status  New    Target Date  04/05/18      PT LONG TERM GOAL #5   Title  Patient will improve her L shoulder FOTO score by at least 10 points as a demonstration of improved function.     Baseline  L shoulder FOTO 55 (02/07/2018)    Time  8    Period  Weeks    Status  New    Target Date  04/05/18      Additional Long Term Goals   Additional Long Term Goals  Yes      PT LONG TERM GOAL #6   Title  Patient will improve her Quick Dash score by at least 10% as a demonstration of improved function.     Baseline  29.5%  (02/07/2018)    Time  8    Period  Weeks    Status  New    Target Date  04/05/18            Plan - 02/20/18 1403    Clinical Impression Statement  Decreased L anterior shoulder pain with donning and doffing shirt motion following exercise to promote inferior glide L shoulder joint. Continued working on scapular and IR and ER muscle strengthening as well as upper thoracic extension to help decrease pressure from her acromion and coracoid processes. Also continued working on glute strengthening to help promote femoral control and decrease hip pain with standing tasks. Pt making progress towards pain goals based on subjective reports.     Rehab Potential  Fair    Clinical Impairments Affecting Rehab Potential  (-)  Chronicity of condition, weakness, multiple areas of pain, age; (+) motivated    PT Frequency  2x / week    PT Duration  8 weeks    PT Treatment/Interventions  Therapeutic activities;Therapeutic exercise;Neuromuscular re-education;Patient/family education;Manual techniques;Dry needling;Aquatic Therapy;Electrical Stimulation;Iontophoresis 4mg /ml Dexamethasone;Ultrasound    PT Next Visit Plan  scapular, glute med, max, trunk strengthening, gentle infraspinatus muscle strengthening, manual techniques, modalities PRN    Consulted and Agree with Plan of Care  Patient       Patient will benefit from skilled therapeutic intervention in order to improve the following deficits and impairments:  Pain, Postural dysfunction, Improper body mechanics, Impaired UE functional use, Decreased strength, Decreased range of motion, Abnormal gait  Visit Diagnosis: Chronic left shoulder pain  Pain in left hip  Muscle weakness (generalized)     Problem List Patient Active Problem List   Diagnosis Date Noted  . DOE (dyspnea on exertion) 01/17/2018  . Proteinuria 01/17/2018  . Left hip pain 01/17/2018  . Rotator cuff  impingement syndrome of left shoulder 01/17/2018  . Myalgia 07/18/2017  . Neuropathy 01/13/2017  . Arrhythmia   . Palpitations   . Essential hypertension   . Right foot pain 03/17/2015  . Non-alcoholic fatty liver disease 03/03/2015  . Gallbladder attack 01/28/2015  . Thyroid nodule 01/26/2015  . CAD (coronary artery disease) 01/26/2015  . Tachycardia 07/30/2014  . Foot swelling 10/30/2012  . Obesity 09/05/2010  . Hyperlipidemia 04/14/2010    Joneen Boers PT, DPT   02/20/2018, 8:24 PM  [facility] [address], [place], [address] Phone: [phone]   Fax:  [phone]  Name: Dawn Herring MRN: [id number] Date of Birth: 01-Oct-1945

## 2018-03-06 ENCOUNTER — Ambulatory Visit: Payer: Medicare Other

## 2018-03-07 ENCOUNTER — Ambulatory Visit: Payer: Medicare Other | Attending: Family Medicine | Admitting: Physical Therapy

## 2018-03-07 DIAGNOSIS — M25552 Pain in left hip: Secondary | ICD-10-CM | POA: Diagnosis not present

## 2018-03-07 DIAGNOSIS — M25512 Pain in left shoulder: Secondary | ICD-10-CM | POA: Diagnosis not present

## 2018-03-07 DIAGNOSIS — M6281 Muscle weakness (generalized): Secondary | ICD-10-CM

## 2018-03-07 DIAGNOSIS — G8929 Other chronic pain: Secondary | ICD-10-CM | POA: Insufficient documentation

## 2018-03-07 NOTE — Therapy (Signed)
[facility] [address], [place], [address] Phone: [phone]   Fax:  [phone]  Physical Therapy Treatment  Patient Details  Name: Dawn Herring MRN: [id number] Date of Birth: February 15, 1946 Referring Provider (PT): Tommi Rumps, MD   Encounter Date: 03/07/2018  PT End of Session - 03/07/18 1300    Visit Number  5    Number of Visits  17    Date for PT Re-Evaluation  04/05/18    Authorization Type  5    Authorization Time Period  of 10 progress report    PT Start Time  1300    PT Stop Time  1340    PT Time Calculation (min)  40 min    Activity Tolerance  Patient tolerated treatment well    Behavior During Therapy  [facility] for tasks assessed/performed       Past Medical History:  Diagnosis Date  . Chest pain    a. Neg stress tests - 2005, 2008, 2009;  b. 08/2014 Cardiac CT: Ca score 193 (80th%'ile).  . Essential hypertension   . Hiatal hernia    a. 08/2014 CT chest: large hiatal hernia.  Marland Kitchen History of DVT (deep vein thrombosis)   . Hyperlipidemia    a. statin intolerant;  b. on zetia and repatha.  . Palpitations    a. 02/2015 Event Monitor: NSR w/ rare, short episodes of A Tach, rare short runs of SVT < 15 beats.    Past Surgical History:  Procedure Laterality Date  . CATARACT EXTRACTION, BILATERAL    . LAPAROSCOPIC APPENDECTOMY N/A 12/02/2015   Procedure: APPENDECTOMY LAPAROSCOPIC;  Surgeon: Clovis Riley, MD;  Location: [facility];  Service: General;  Laterality: N/A;  . ROOT CANAL  2013  . ROOT CANAL    . VAGINAL HYSTERECTOMY     abdominal hysterectomy    There were no vitals filed for this visit.  Subjective Assessment - 03/07/18 1305    Subjective  L shoulder feels okay but patient states that she has done some strenuous activity (moving boxes and furniture) which has aggravated it. The shoulder pain is most bothersome at night in the bed when she sleeps on the L side and fingers go numb. L  hip still periodically hurts, specifically going up and down steps, but she prioritizes her shoulder. Patient expressed that she is under increased stress today due to helping with a move.    Pertinent History  L shoulder and L hip pain. L shoulder bothers her more which keeps her from sleeping. Difficulty laying in her L side. Difficulty donning and doffing shirts and sweaters. Not sure how much of her pain is related to her cholesterol medicine. Has had reactions to her cholesterol medicines before.   L shoulder pain started about a little over a year ago which has gradually worsened. Also has a difficult time walking after sitting for a while.  L shoulder pain occured gradually. Only noticed L shoulder bothering her sleeping in the past few months.  Has not yet had imaging for her L shoulder and hip.  Pt is R hand dominant.  L hip pain began years ago which disappeared. L hip pain started bothering her consistently for the past 6 months particularly when pt tries to sleep.  Denies unexplained changes in weight.      Patient Stated Goals  Want to be able to keep moving.     Currently in Pain?  No/denies  Pain Score  0-No pain    Pain Onset  More than a month ago       Manual Interventions Supine, STM L upper trapezius, pec major, latissimus dorsi, and deltoids Supine, LUE gentle distraction 3x 30 sec bouts Supine, LUE inferior glide 30 sec at 45 degrees abducted, 60 degrees abducted, 90 degrees abducted  Therapeutic Exercise Supine, Chin tucks 5 sec hold x 10 x 3 Seated, LUE Crossbody stretch, 30 sec x6 Reviewed HEP      PT Education - 03/07/18 1448    Education Details  exercise technique, body mechanics    Person(s) Educated  Patient    Methods  Explanation;Demonstration;Verbal cues    Comprehension  Returned demonstration;Verbalized understanding;Need further instruction       PT Short Term Goals - 02/07/18 1613      PT SHORT TERM GOAL #1   Title  Patient will be independent  with her HEP to decrease L shoulder and hip pain, improve strength and function.     Baseline  Pt has started her HEP (02/07/2018)     Time  3    Period  Weeks    Status  New    Target Date  03/01/18        PT Long Term Goals - 02/07/18 1615      PT LONG TERM GOAL #1   Title  Patient will have a decrease in L shoulder pain to 2/10 or less at worst to promote ability to don and doff her shirts and sweaters, as well as sleep on her L side.     Baseline  5/10 L shoulder pain at most for the past 3 months (02/07/2018)    Time  8    Period  Weeks    Status  New    Target Date  04/05/18      PT LONG TERM GOAL #2   Title  Patient will have a decrease in L hip pain to 3/10 or less at worst to promote ability to perform standing tasks such as stair negotiation.     Baseline  7/10 L hip pain at most for the past 3 months (02/07/2018)    Time  8    Period  Weeks    Status  New    Target Date  04/05/18      PT LONG TERM GOAL #3   Title  Patient will improve L shoulder ER strength by at least 1/2 MMT grade to promote ability to raise her arm and use it to perform functional tasks more comfortably.     Baseline  L shoulder ER 4-/5 (02/07/2018)    Time  8    Period  Weeks    Status  New    Target Date  04/05/18      PT LONG TERM GOAL #4   Title  Patient will improve L glute med and max strength by at least 1/2 MMT grade to promote ability to perform standing tasks such as stair negotiation more comfortably for her L hip.     Baseline  L hip extension 4-/5 (seated manually resisted), hip abduction 4/5 (seated manually resisted clamshell isometrics) 02/07/2018.     Time  8    Period  Weeks    Status  New    Target Date  04/05/18      PT LONG TERM GOAL #5   Title  Patient will improve her L shoulder FOTO score by at least 10 points as a demonstration  of improved function.     Baseline  L shoulder FOTO 55 (02/07/2018)    Time  8    Period  Weeks    Status  New    Target Date  04/05/18       Additional Long Term Goals   Additional Long Term Goals  Yes      PT LONG TERM GOAL #6   Title  Patient will improve her Quick Dash score by at least 10% as a demonstration of improved function.     Baseline  29.5%  (02/07/2018)    Time  8    Period  Weeks    Status  New    Target Date  04/05/18            Plan - 03/07/18 1449    Clinical Impression Statement  Patient presents with increased L shoulder pain 2/2 to activities outside of therapy as well as increased stress, but is amenable to therapy. Patient had decreased pain response after gentle manual therapy interventions at the L shoulder, as well as stretching. Patient had limited time to day due to other commitments and wanted to focus the session on calming activities in order to mitigate some of the aggravating factors occuring in her daily life this week. Patient will continue to benefit from skilled therapeutic intervention in order to address deficits in ROM, strength, and function in order to improve overall QOL.    Rehab Potential  Fair    Clinical Impairments Affecting Rehab Potential  (-) Chronicity of condition, weakness, multiple areas of pain, age; (+) motivated    PT Frequency  2x / week    PT Duration  8 weeks    PT Treatment/Interventions  Therapeutic activities;Therapeutic exercise;Neuromuscular re-education;Patient/family education;Manual techniques;Dry needling;Aquatic Therapy;Electrical Stimulation;Iontophoresis 4mg /ml Dexamethasone;Ultrasound    PT Next Visit Plan  scapular, glute med, max, trunk strengthening, gentle infraspinatus muscle strengthening, manual techniques, modalities PRN    Consulted and Agree with Plan of Care  Patient       Patient will benefit from skilled therapeutic intervention in order to improve the following deficits and impairments:  Pain, Postural dysfunction, Improper body mechanics, Impaired UE functional use, Decreased strength, Decreased range of motion, Abnormal  gait  Visit Diagnosis: Chronic left shoulder pain  Muscle weakness (generalized)  Pain in left hip     Problem List Patient Active Problem List   Diagnosis Date Noted  . DOE (dyspnea on exertion) 01/17/2018  . Proteinuria 01/17/2018  . Left hip pain 01/17/2018  . Rotator cuff impingement syndrome of left shoulder 01/17/2018  . Myalgia 07/18/2017  . Neuropathy 01/13/2017  . Arrhythmia   . Palpitations   . Essential hypertension   . Right foot pain 03/17/2015  . Non-alcoholic fatty liver disease 03/03/2015  . Gallbladder attack 01/28/2015  . Thyroid nodule 01/26/2015  . CAD (coronary artery disease) 01/26/2015  . Tachycardia 07/30/2014  . Foot swelling 10/30/2012  . Obesity 09/05/2010  . Hyperlipidemia 04/14/2010    Myles Gip PT, DPT [phone] 03/07/2018, 2:57 PM  [facility] [address], [place], [address] Phone: [phone]   Fax:  [phone]  Name: Dawn Herring MRN: [id number] Date of Birth: June 18, 1945

## 2018-03-08 ENCOUNTER — Ambulatory Visit: Payer: Medicare Other

## 2018-03-08 DIAGNOSIS — M6281 Muscle weakness (generalized): Secondary | ICD-10-CM

## 2018-03-08 DIAGNOSIS — G8929 Other chronic pain: Secondary | ICD-10-CM | POA: Diagnosis not present

## 2018-03-08 DIAGNOSIS — M25512 Pain in left shoulder: Principal | ICD-10-CM

## 2018-03-08 DIAGNOSIS — M25552 Pain in left hip: Secondary | ICD-10-CM

## 2018-03-08 NOTE — Therapy (Signed)
[facility] [address], [place], [address] Phone: [phone]   Fax:  [phone]  Physical Therapy Treatment  Patient Details  Name: Dawn Herring MRN: [id number] Date of Birth: 06-Jul-1945 Referring Provider (PT): Tommi Rumps, MD   Encounter Date: 03/08/2018  PT End of Session - 03/08/18 1505    Visit Number  6    Number of Visits  17    Date for PT Re-Evaluation  04/05/18    Authorization Type  6    Authorization Time Period  of 10 progress report    PT Start Time  1505    PT Stop Time  1547    PT Time Calculation (min)  42 min    Activity Tolerance  Patient tolerated treatment well    Behavior During Therapy  [facility] for tasks assessed/performed       Past Medical History:  Diagnosis Date  . Chest pain    a. Neg stress tests - 2005, 2008, 2009;  b. 08/2014 Cardiac CT: Ca score 193 (80th%'ile).  . Essential hypertension   . Hiatal hernia    a. 08/2014 CT chest: large hiatal hernia.  Marland Kitchen History of DVT (deep vein thrombosis)   . Hyperlipidemia    a. statin intolerant;  b. on zetia and repatha.  . Palpitations    a. 02/2015 Event Monitor: NSR w/ rare, short episodes of A Tach, rare short runs of SVT < 15 beats.    Past Surgical History:  Procedure Laterality Date  . CATARACT EXTRACTION, BILATERAL    . LAPAROSCOPIC APPENDECTOMY N/A 12/02/2015   Procedure: APPENDECTOMY LAPAROSCOPIC;  Surgeon: Clovis Riley, MD;  Location: [place];  Service: General;  Laterality: N/A;  . ROOT CANAL  2013  . ROOT CANAL    . VAGINAL HYSTERECTOMY     abdominal hysterectomy    There were no vitals filed for this visit.  Subjective Assessment - 03/08/18 1506    Subjective  Still working on moving boxes and furniture. L shoulder feels real bad sometimes. Reaching across and raising her L arm up still bothers her.  No pain currently at rest.  Donning and doffing shirts has been better since PT.  Also Felt a catch in  her L hip last Friday and got better.     Pertinent History  L shoulder and L hip pain. L shoulder bothers her more which keeps her from sleeping. Difficulty laying in her L side. Difficulty donning and doffing shirts and sweaters. Not sure how much of her pain is related to her cholesterol medicine. Has had reactions to her cholesterol medicines before.   L shoulder pain started about a little over a year ago which has gradually worsened. Also has a difficult time walking after sitting for a while.  L shoulder pain occured gradually. Only noticed L shoulder bothering her sleeping in the past few months.  Has not yet had imaging for her L shoulder and hip.  Pt is R hand dominant.  L hip pain began years ago which disappeared. L hip pain started bothering her consistently for the past 6 months particularly when pt tries to sleep.  Denies unexplained changes in weight.      Patient Stated Goals  Want to be able to keep moving.     Currently in Pain?  No/denies    Pain Score  0-No pain   at rest   Pain Onset  More than a month ago  PT Education - 03/08/18 1517    Education Details  ther-ex    Person(s) Educated  Patient    Methods  Explanation;Demonstration;Tactile cues;Verbal cues    Comprehension  Verbalized understanding;Returned demonstration        Objectives   Medbridge Access Coe: [facility]  Therapeutic exercise  L shoulder ER with 5 lbs at distal arm 10x3 yellow band  L shoulder IR resisting red band 10x3 with 5 lbs at distal arm  L shoulder ER with arm in scaption propped on table 10x 2 lbs  Then 3 lbs 10x2  Seated L shoulder self inferior glide with L hand holding onto table  10x5 seconds for 3 sets   Decreased L shoulder discomfort with motion to don and doff a shirt.   L UE weight shifting, hands on table in table push-up position 8x 5 seconds for 2 sets  Decreased pain with reaching across with L arm.    Standing L LE leg press resisting double blue band 10x3 with B UE assistto promote glute max muscle strengthening.  Improved exercise technique, movement at target joints, use of target muscles after min to mod verbal, visual, tactile cues.   Improved ability to perform donning and doffing shirt motion as well as reaching across her body with her L arm following treatment to improve inferior and posterior L shoulder joint mobility. Pt will benefit from continued skilled physical therapy services to decrease pain, improve strength and function.          PT Short Term Goals - 02/07/18 1613      PT SHORT TERM GOAL #1   Title  Patient will be independent with her HEP to decrease L shoulder and hip pain, improve strength and function.     Baseline  Pt has started her HEP (02/07/2018)     Time  3    Period  Weeks    Status  New    Target Date  03/01/18        PT Long Term Goals - 02/07/18 1615      PT LONG TERM GOAL #1   Title  Patient will have a decrease in L shoulder pain to 2/10 or less at worst to promote ability to don and doff her shirts and sweaters, as well as sleep on her L side.     Baseline  5/10 L shoulder pain at most for the past 3 months (02/07/2018)    Time  8    Period  Weeks    Status  New    Target Date  04/05/18      PT LONG TERM GOAL #2   Title  Patient will have a decrease in L hip pain to 3/10 or less at worst to promote ability to perform standing tasks such as stair negotiation.     Baseline  7/10 L hip pain at most for the past 3 months (02/07/2018)    Time  8    Period  Weeks    Status  New    Target Date  04/05/18      PT LONG TERM GOAL #3   Title  Patient will improve L shoulder ER strength by at least 1/2 MMT grade to promote ability to raise her arm and use it to perform functional tasks more comfortably.     Baseline  L shoulder ER 4-/5 (02/07/2018)    Time  8    Period  Weeks    Status  New    Target  Date  04/05/18      PT LONG  TERM GOAL #4   Title  Patient will improve L glute med and max strength by at least 1/2 MMT grade to promote ability to perform standing tasks such as stair negotiation more comfortably for her L hip.     Baseline  L hip extension 4-/5 (seated manually resisted), hip abduction 4/5 (seated manually resisted clamshell isometrics) 02/07/2018.     Time  8    Period  Weeks    Status  New    Target Date  04/05/18      PT LONG TERM GOAL #5   Title  Patient will improve her L shoulder FOTO score by at least 10 points as a demonstration of improved function.     Baseline  L shoulder FOTO 55 (02/07/2018)    Time  8    Period  Weeks    Status  New    Target Date  04/05/18      Additional Long Term Goals   Additional Long Term Goals  Yes      PT LONG TERM GOAL #6   Title  Patient will improve her Quick Dash score by at least 10% as a demonstration of improved function.     Baseline  29.5%  (02/07/2018)    Time  8    Period  Weeks    Status  New    Target Date  04/05/18            Plan - 03/08/18 1517    Clinical Impression Statement  Improved ability to perform donning and doffing shirt motion as well as reaching across her body with her L arm following treatment to improve inferior and posterior L shoulder joint mobility. Pt will benefit from continued skilled physical therapy services to decrease pain, improve strength and function.     Rehab Potential  Fair    Clinical Impairments Affecting Rehab Potential  (-) Chronicity of condition, weakness, multiple areas of pain, age; (+) motivated    PT Frequency  2x / week    PT Duration  8 weeks    PT Treatment/Interventions  Therapeutic activities;Therapeutic exercise;Neuromuscular re-education;Patient/family education;Manual techniques;Dry needling;Aquatic Therapy;Electrical Stimulation;Iontophoresis 4mg /ml Dexamethasone;Ultrasound    PT Next Visit Plan  scapular, glute med, max, trunk strengthening, gentle infraspinatus muscle  strengthening, manual techniques, modalities PRN    Consulted and Agree with Plan of Care  Patient       Patient will benefit from skilled therapeutic intervention in order to improve the following deficits and impairments:  Pain, Postural dysfunction, Improper body mechanics, Impaired UE functional use, Decreased strength, Decreased range of motion, Abnormal gait  Visit Diagnosis: Chronic left shoulder pain  Muscle weakness (generalized)  Pain in left hip     Problem List Patient Active Problem List   Diagnosis Date Noted  . DOE (dyspnea on exertion) 01/17/2018  . Proteinuria 01/17/2018  . Left hip pain 01/17/2018  . Rotator cuff impingement syndrome of left shoulder 01/17/2018  . Myalgia 07/18/2017  . Neuropathy 01/13/2017  . Arrhythmia   . Palpitations   . Essential hypertension   . Right foot pain 03/17/2015  . Non-alcoholic fatty liver disease 03/03/2015  . Gallbladder attack 01/28/2015  . Thyroid nodule 01/26/2015  . CAD (coronary artery disease) 01/26/2015  . Tachycardia 07/30/2014  . Foot swelling 10/30/2012  . Obesity 09/05/2010  . Hyperlipidemia 04/14/2010    Joneen Boers PT, DPT   03/08/2018, 3:59 PM  [facility] [address] [place], [address] Phone: [phone]   Fax:  [phone]  Name: IAN CAVEY MRN: [id number] Date of Birth: 1945/10/01

## 2018-03-13 ENCOUNTER — Ambulatory Visit: Payer: Medicare Other

## 2018-03-13 DIAGNOSIS — M25512 Pain in left shoulder: Secondary | ICD-10-CM | POA: Diagnosis not present

## 2018-03-13 DIAGNOSIS — G8929 Other chronic pain: Secondary | ICD-10-CM | POA: Diagnosis not present

## 2018-03-13 DIAGNOSIS — M6281 Muscle weakness (generalized): Secondary | ICD-10-CM

## 2018-03-13 DIAGNOSIS — M25552 Pain in left hip: Secondary | ICD-10-CM | POA: Diagnosis not present

## 2018-03-13 NOTE — Therapy (Signed)
[facility] [facility] [address] [place], [address] Phone: [phone]   Fax:  [phone]  Physical Therapy Treatment  Patient Details  Name: Dawn Herring MRN: [id number] Date of Birth: 10-09-1945 Referring Provider (PT): Tommi Rumps, MD   Encounter Date: 03/13/2018  PT End of Session - 03/13/18 1119    Visit Number  7    Number of Visits  17    Date for PT Re-Evaluation  04/05/18    Authorization Type  7    Authorization Time Period  of 10 progress report    PT Start Time  1120    PT Stop Time  1203    PT Time Calculation (min)  43 min    Activity Tolerance  Patient tolerated treatment well    Behavior During Therapy  [facility] for tasks assessed/performed       Past Medical History:  Diagnosis Date  . Chest pain    a. Neg stress tests - 2005, 2008, 2009;  b. 08/2014 Cardiac CT: Ca score 193 (80th%'ile).  . Essential hypertension   . Hiatal hernia    a. 08/2014 CT chest: large hiatal hernia.  Marland Kitchen History of DVT (deep vein thrombosis)   . Hyperlipidemia    a. statin intolerant;  b. on zetia and repatha.  . Palpitations    a. 02/2015 Event Monitor: NSR w/ rare, short episodes of A Tach, rare short runs of SVT < 15 beats.    Past Surgical History:  Procedure Laterality Date  . CATARACT EXTRACTION, BILATERAL    . LAPAROSCOPIC APPENDECTOMY N/A 12/02/2015   Procedure: APPENDECTOMY LAPAROSCOPIC;  Surgeon: Clovis Riley, MD;  Location: [place];  Service: General;  Laterality: N/A;  . ROOT CANAL  2013  . ROOT CANAL    . VAGINAL HYSTERECTOMY     abdominal hysterectomy    There were no vitals filed for this visit.  Subjective Assessment - 03/13/18 1121    Subjective  L shoulder is pretty good. Did pretty good yesterday with it. No pain currently.     Pertinent History  L shoulder and L hip pain. L shoulder bothers her more which keeps her from sleeping. Difficulty laying in her L side. Difficulty donning and  doffing shirts and sweaters. Not sure how much of her pain is related to her cholesterol medicine. Has had reactions to her cholesterol medicines before.   L shoulder pain started about a little over a year ago which has gradually worsened. Also has a difficult time walking after sitting for a while.  L shoulder pain occured gradually. Only noticed L shoulder bothering her sleeping in the past few months.  Has not yet had imaging for her L shoulder and hip.  Pt is R hand dominant.  L hip pain began years ago which disappeared. L hip pain started bothering her consistently for the past 6 months particularly when pt tries to sleep.  Denies unexplained changes in weight.      Patient Stated Goals  Want to be able to keep moving.     Currently in Pain?  No/denies    Pain Score  0-No pain    Pain Onset  More than a month ago                               PT Education - 03/13/18 1129    Education Details  ther-ex  Person(s) Educated  Patient    Methods  Explanation;Demonstration;Tactile cues;Verbal cues    Comprehension  Returned demonstration;Verbalized understanding         Objectives   Medbridge Access Coe: [facility]  Denies personal history of CA or osteoporosis, no pacemakers or defibrillators. Denies latex band allergies   Therapeutic exercise  No pain with donning and doffing shirt motion L shoulder symptoms with shoulder adduction  L UE weight shifting, hands on table in table push-up position 8x 5 seconds for 2 sets             Decreased pain with reaching across with L arm.    Give as part of HEP next visit if appropriate  L shoulder horizontal adduction yellow band 10x2  L shoulder extension resisting red band 10x3  L shoulder IR resisting redband 10x3 with 5 lbs at distal arm  L shoulder ER with 5 lbs at distal arm 8x red band. Difficult   Then yellow band 10x   Improved exercise technique, movement at target joints, use of target  muscles after min to mod verbal, visual, tactile cues.    Manual Therapy  Supine with L arm in 90 degrees abduction  Inferior glide L shoulder joint grade 3-  Seated STM L upper trap muscle to decrease tension      Able to perform donning and doffing shirt motion today without complain of L shoulder pain. Slight decreased anterior medial shoulder discomfort with horizontal adduction with posterior glide L shoulder joint through weight shifting exercise at table. Pt tolerated session well without aggravation of symptoms. Patient will benefit from continued skilled physical therapy services to decrease pain, improve function.         PT Short Term Goals - 02/07/18 1613      PT SHORT TERM GOAL #1   Title  Patient will be independent with her HEP to decrease L shoulder and hip pain, improve strength and function.     Baseline  Pt has started her HEP (02/07/2018)     Time  3    Period  Weeks    Status  New    Target Date  03/01/18        PT Long Term Goals - 02/07/18 1615      PT LONG TERM GOAL #1   Title  Patient will have a decrease in L shoulder pain to 2/10 or less at worst to promote ability to don and doff her shirts and sweaters, as well as sleep on her L side.     Baseline  5/10 L shoulder pain at most for the past 3 months (02/07/2018)    Time  8    Period  Weeks    Status  New    Target Date  04/05/18      PT LONG TERM GOAL #2   Title  Patient will have a decrease in L hip pain to 3/10 or less at worst to promote ability to perform standing tasks such as stair negotiation.     Baseline  7/10 L hip pain at most for the past 3 months (02/07/2018)    Time  8    Period  Weeks    Status  New    Target Date  04/05/18      PT LONG TERM GOAL #3   Title  Patient will improve L shoulder ER strength by at least 1/2 MMT grade to promote ability to raise her arm and use it to perform functional tasks  more comfortably.     Baseline  L shoulder ER 4-/5 (02/07/2018)     Time  8    Period  Weeks    Status  New    Target Date  04/05/18      PT LONG TERM GOAL #4   Title  Patient will improve L glute med and max strength by at least 1/2 MMT grade to promote ability to perform standing tasks such as stair negotiation more comfortably for her L hip.     Baseline  L hip extension 4-/5 (seated manually resisted), hip abduction 4/5 (seated manually resisted clamshell isometrics) 02/07/2018.     Time  8    Period  Weeks    Status  New    Target Date  04/05/18      PT LONG TERM GOAL #5   Title  Patient will improve her L shoulder FOTO score by at least 10 points as a demonstration of improved function.     Baseline  L shoulder FOTO 55 (02/07/2018)    Time  8    Period  Weeks    Status  New    Target Date  04/05/18      Additional Long Term Goals   Additional Long Term Goals  Yes      PT LONG TERM GOAL #6   Title  Patient will improve her Quick Dash score by at least 10% as a demonstration of improved function.     Baseline  29.5%  (02/07/2018)    Time  8    Period  Weeks    Status  New    Target Date  04/05/18            Plan - 03/13/18 1132    Clinical Impression Statement  Able to perform donning and doffing shirt motion today without complain of L shoulder pain. Slight decreased anterior medial shoulder discomfort with horizontal adduction with posterior glide L shoulder joint through weight shifting exercise at table. Pt tolerated session well without aggravation of symptoms. Patient will benefit from continued skilled physical therapy services to decrease pain, improve function.     Rehab Potential  Fair    Clinical Impairments Affecting Rehab Potential  (-) Chronicity of condition, weakness, multiple areas of pain, age; (+) motivated    PT Frequency  2x / week    PT Duration  8 weeks    PT Treatment/Interventions  Therapeutic activities;Therapeutic exercise;Neuromuscular re-education;Patient/family education;Manual techniques;Dry  needling;Aquatic Therapy;Electrical Stimulation;Iontophoresis 4mg /ml Dexamethasone;Ultrasound    PT Next Visit Plan  scapular, glute med, max, trunk strengthening, gentle infraspinatus muscle strengthening, manual techniques, modalities PRN    Consulted and Agree with Plan of Care  Patient       Patient will benefit from skilled therapeutic intervention in order to improve the following deficits and impairments:  Pain, Postural dysfunction, Improper body mechanics, Impaired UE functional use, Decreased strength, Decreased range of motion, Abnormal gait  Visit Diagnosis: Chronic left shoulder pain  Muscle weakness (generalized)     Problem List Patient Active Problem List   Diagnosis Date Noted  . DOE (dyspnea on exertion) 01/17/2018  . Proteinuria 01/17/2018  . Left hip pain 01/17/2018  . Rotator cuff impingement syndrome of left shoulder 01/17/2018  . Myalgia 07/18/2017  . Neuropathy 01/13/2017  . Arrhythmia   . Palpitations   . Essential hypertension   . Right foot pain 03/17/2015  . Non-alcoholic fatty liver disease 03/03/2015  . Gallbladder attack 01/28/2015  . Thyroid nodule 01/26/2015  .  CAD (coronary artery disease) 01/26/2015  . Tachycardia 07/30/2014  . Foot swelling 10/30/2012  . Obesity 09/05/2010  . Hyperlipidemia 04/14/2010    Joneen Boers PT, DPT   03/13/2018, 6:34 PM  [facility] [address], [place], [address] Phone: [phone]   Fax:  [phone]  Name: SHILEE BIGGS MRN: [id number] Date of Birth: 07-25-1945

## 2018-03-15 ENCOUNTER — Ambulatory Visit: Payer: Medicare Other

## 2018-03-15 DIAGNOSIS — M25512 Pain in left shoulder: Principal | ICD-10-CM

## 2018-03-15 DIAGNOSIS — M6281 Muscle weakness (generalized): Secondary | ICD-10-CM | POA: Diagnosis not present

## 2018-03-15 DIAGNOSIS — M25552 Pain in left hip: Secondary | ICD-10-CM | POA: Diagnosis not present

## 2018-03-15 DIAGNOSIS — G8929 Other chronic pain: Secondary | ICD-10-CM | POA: Diagnosis not present

## 2018-03-15 NOTE — Therapy (Signed)
[facility] [address] [place], [address] Phone: [phone]   Fax:  [phone]  Physical Therapy Treatment  Patient Details  Name: Dawn Herring MRN: [id number] Date of Birth: 23-Oct-1945 Referring Provider (PT): Tommi Rumps, MD   Encounter Date: 03/15/2018  PT End of Session - 03/15/18 1119    Visit Number  8    Number of Visits  17    Date for PT Re-Evaluation  04/05/18    Authorization Type  8    Authorization Time Period  of 10 progress report    PT Start Time  1119    PT Stop Time  1200    PT Time Calculation (min)  41 min    Activity Tolerance  Patient tolerated treatment well    Behavior During Therapy  [facility] for tasks assessed/performed       Past Medical History:  Diagnosis Date  . Chest pain    a. Neg stress tests - 2005, 2008, 2009;  b. 08/2014 Cardiac CT: Ca score 193 (80th%'ile).  . Essential hypertension   . Hiatal hernia    a. 08/2014 CT chest: large hiatal hernia.  Marland Kitchen History of DVT (deep vein thrombosis)   . Hyperlipidemia    a. statin intolerant;  b. on zetia and repatha.  . Palpitations    a. 02/2015 Event Monitor: NSR w/ rare, short episodes of A Tach, rare short runs of SVT < 15 beats.    Past Surgical History:  Procedure Laterality Date  . CATARACT EXTRACTION, BILATERAL    . LAPAROSCOPIC APPENDECTOMY N/A 12/02/2015   Procedure: APPENDECTOMY LAPAROSCOPIC;  Surgeon: Clovis Riley, MD;  Location: [facility];  Service: General;  Laterality: N/A;  . ROOT CANAL  2013  . ROOT CANAL    . VAGINAL HYSTERECTOMY     abdominal hysterectomy    There were no vitals filed for this visit.  Subjective Assessment - 03/15/18 1121    Subjective  L shoulder is pretty good. Has not done anything this morning. Can feel a twinge L shoulder when donning a shirt. Does not feel it now.  Can feel a little pain with reaching across.  4/10 L shoulder pain at most for the past 7 days. Its better.   2/ L hip pain at most for the past 7 days.  Almost fell when she tripped on her carpet a few days ago while wearing her rain boots (heavy)    Pertinent History  L shoulder and L hip pain. L shoulder bothers her more which keeps her from sleeping. Difficulty laying in her L side. Difficulty donning and doffing shirts and sweaters. Not sure how much of her pain is related to her cholesterol medicine. Has had reactions to her cholesterol medicines before.   L shoulder pain started about a little over a year ago which has gradually worsened. Also has a difficult time walking after sitting for a while.  L shoulder pain occured gradually. Only noticed L shoulder bothering her sleeping in the past few months.  Has not yet had imaging for her L shoulder and hip.  Pt is R hand dominant.  L hip pain began years ago which disappeared. L hip pain started bothering her consistently for the past 6 months particularly when pt tries to sleep.  Denies unexplained changes in weight.      Patient Stated Goals  Want to be able to keep moving.     Currently in Pain?  Yes    Pain Score  3    when reaching across, no pain at rest   Pain Onset  More than a month ago         [facility] PT Assessment - 03/15/18 1159      Strength   Left Shoulder External Rotation  4/5   no pain                          PT Education - 03/15/18 1127    Education Details  ther-ex, HEP    Person(s) Educated  Patient    Methods  Explanation;Demonstration;Tactile cues;Verbal cues;Handout    Comprehension  Returned demonstration;Verbalized understanding      Objectives   Medbridge Access Coe: [facility]  Denies personal history of CA or osteoporosis, no pacemakers or defibrillators. Denies latex band allergies   Therapeutic exercise    L UE weight shifting, hands on table in table push-up position 8x 5 seconds for 3 sets   Reviewed and given as part of her HEP. Pt demonstrated and verbalized  understanding. Handout provided.   L shoulder ER with 5 lbs at distal arm yellow band 10x2  L shoulder IR resisting redband 10x3with 5 lbs at distal arm  Stepping over 4 mini hurdles   6x  Then with 2 lb ankle weights at ankles 8x  Able to perform without UE assist and no LOB  Omega machine   Rows plate 10 for 93G1  Manually resisted L shoulder ER 1x  Improved exercise technique, movement at target joints, use of target muscles after min to mod verbal, visual, tactile cues.    Manual Therapy  Supine with L arm in 90 degrees abduction             Inferior glide L shoulder joint grade 3- to 3   Seated STM L upper trap muscle to decrease tension    Pt improving ability to don and doff shirt with less L shoulder pain overall. Continued working on improving inferior L shoulder joint mobility to decrease stiffness, as well as improving rotator cuff and scapular strength to promote better glenohumeral joint movement with arm motions. Pt also demonstrates overall improved L hip pain based on subjective reports. Worked on stepping over obstacle secondary to pt reports of almost falling after tripping over a rug. Pt able to perform independently without UE assist. Pt tolerated session well without aggravation of symptoms. Pt will benefit from continued skilled physical therapy services to decrease pain, improve strength and function.        PT Short Term Goals - 02/07/18 1613      PT SHORT TERM GOAL #1   Title  Patient will be independent with her HEP to decrease L shoulder and hip pain, improve strength and function.     Baseline  Pt has started her HEP (02/07/2018)     Time  3    Period  Weeks    Status  New    Target Date  03/01/18        PT Long Term Goals - 02/07/18 1615      PT LONG TERM GOAL #1   Title  Patient will have a decrease in L shoulder pain to 2/10 or less at worst to promote ability to don and doff her shirts and sweaters, as well as sleep on her  L side.     Baseline  5/10 L shoulder pain at most for the past 3  months (02/07/2018)    Time  8    Period  Weeks    Status  New    Target Date  04/05/18      PT LONG TERM GOAL #2   Title  Patient will have a decrease in L hip pain to 3/10 or less at worst to promote ability to perform standing tasks such as stair negotiation.     Baseline  7/10 L hip pain at most for the past 3 months (02/07/2018)    Time  8    Period  Weeks    Status  New    Target Date  04/05/18      PT LONG TERM GOAL #3   Title  Patient will improve L shoulder ER strength by at least 1/2 MMT grade to promote ability to raise her arm and use it to perform functional tasks more comfortably.     Baseline  L shoulder ER 4-/5 (02/07/2018)    Time  8    Period  Weeks    Status  New    Target Date  04/05/18      PT LONG TERM GOAL #4   Title  Patient will improve L glute med and max strength by at least 1/2 MMT grade to promote ability to perform standing tasks such as stair negotiation more comfortably for her L hip.     Baseline  L hip extension 4-/5 (seated manually resisted), hip abduction 4/5 (seated manually resisted clamshell isometrics) 02/07/2018.     Time  8    Period  Weeks    Status  New    Target Date  04/05/18      PT LONG TERM GOAL #5   Title  Patient will improve her L shoulder FOTO score by at least 10 points as a demonstration of improved function.     Baseline  L shoulder FOTO 55 (02/07/2018)    Time  8    Period  Weeks    Status  New    Target Date  04/05/18      Additional Long Term Goals   Additional Long Term Goals  Yes      PT LONG TERM GOAL #6   Title  Patient will improve her Quick Dash score by at least 10% as a demonstration of improved function.     Baseline  29.5%  (02/07/2018)    Time  8    Period  Weeks    Status  New    Target Date  04/05/18            Plan - 03/15/18 1502    Clinical Impression Statement  Pt improving ability to don and doff shirt with less L  shoulder pain overall. Continued working on improving inferior L shoulder joint mobility to decrease stiffness, as well as improving rotator cuff and scapular strength to promote better glenohumeral joint movement with arm motions. Pt also demonstrates overall improved L hip pain based on subjective reports. Worked on stepping over obstacle secondary to pt reports of almost falling after tripping over a rug. Pt able to perform independently without UE assist. Pt tolerated session well without aggravation of symptoms. Pt will benefit from continued skilled physical therapy services to decrease pain, improve strength and function.     Rehab Potential  Fair    Clinical Impairments Affecting Rehab Potential  (-) Chronicity of condition, weakness, multiple areas of pain, age; (+) motivated    PT Frequency  2x / week  PT Duration  8 weeks    PT Treatment/Interventions  Therapeutic activities;Therapeutic exercise;Neuromuscular re-education;Patient/family education;Manual techniques;Dry needling;Aquatic Therapy;Electrical Stimulation;Iontophoresis 4mg /ml Dexamethasone;Ultrasound    PT Next Visit Plan  scapular, glute med, max, trunk strengthening, gentle infraspinatus muscle strengthening, manual techniques, modalities PRN    Consulted and Agree with Plan of Care  Patient       Patient will benefit from skilled therapeutic intervention in order to improve the following deficits and impairments:  Pain, Postural dysfunction, Improper body mechanics, Impaired UE functional use, Decreased strength, Decreased range of motion, Abnormal gait  Visit Diagnosis: Chronic left shoulder pain  Muscle weakness (generalized)  Pain in left hip     Problem List Patient Active Problem List   Diagnosis Date Noted  . DOE (dyspnea on exertion) 01/17/2018  . Proteinuria 01/17/2018  . Left hip pain 01/17/2018  . Rotator cuff impingement syndrome of left shoulder 01/17/2018  . Myalgia 07/18/2017  . Neuropathy  01/13/2017  . Arrhythmia   . Palpitations   . Essential hypertension   . Right foot pain 03/17/2015  . Non-alcoholic fatty liver disease 03/03/2015  . Gallbladder attack 01/28/2015  . Thyroid nodule 01/26/2015  . CAD (coronary artery disease) 01/26/2015  . Tachycardia 07/30/2014  . Foot swelling 10/30/2012  . Obesity 09/05/2010  . Hyperlipidemia 04/14/2010    Joneen Boers PT, DPT   03/15/2018, 3:07 PM  [facility] [address] [place], [address] Phone: [phone]   Fax:  [phone]  Name: Dawn Herring MRN: [id number] Date of Birth: 01/06/1946

## 2018-03-15 NOTE — Patient Instructions (Signed)
Upper extremity weight shifting.   Place your hands on the bar   Shift your body weight to the left and let your body sag to the floor to feel a stretch behind your left shoulder.    Hold for 5 seconds    Repeat 10 times   Perform 2-3 sets daily.

## 2018-03-19 ENCOUNTER — Ambulatory Visit: Payer: Medicare Other

## 2018-03-19 DIAGNOSIS — M6281 Muscle weakness (generalized): Secondary | ICD-10-CM

## 2018-03-19 DIAGNOSIS — G8929 Other chronic pain: Secondary | ICD-10-CM | POA: Diagnosis not present

## 2018-03-19 DIAGNOSIS — M25552 Pain in left hip: Secondary | ICD-10-CM | POA: Diagnosis not present

## 2018-03-19 DIAGNOSIS — M25512 Pain in left shoulder: Principal | ICD-10-CM

## 2018-03-19 NOTE — Therapy (Signed)
[facility] [address], [place], [address] Phone: [phone]   Fax:  [phone]  Physical Therapy Treatment  Patient Details  Name: Dawn Herring MRN: [id number] Date of Birth: 06/10/1945 Referring Provider (PT): Tommi Rumps, MD   Encounter Date: 03/19/2018  PT End of Session - 03/19/18 1036    Visit Number  9    Number of Visits  17    Date for PT Re-Evaluation  04/05/18    Authorization Type  9    Authorization Time Period  of 10 progress report    PT Start Time  1039    PT Stop Time  1120    PT Time Calculation (min)  41 min    Activity Tolerance  Patient tolerated treatment well    Behavior During Therapy  [facility] for tasks assessed/performed       Past Medical History:  Diagnosis Date  . Chest pain    a. Neg stress tests - 2005, 2008, 2009;  b. 08/2014 Cardiac CT: Ca score 193 (80th%'ile).  . Essential hypertension   . Hiatal hernia    a. 08/2014 CT chest: large hiatal hernia.  Marland Kitchen History of DVT (deep vein thrombosis)   . Hyperlipidemia    a. statin intolerant;  b. on zetia and repatha.  . Palpitations    a. 02/2015 Event Monitor: NSR w/ rare, short episodes of A Tach, rare short runs of SVT < 15 beats.    Past Surgical History:  Procedure Laterality Date  . CATARACT EXTRACTION, BILATERAL    . LAPAROSCOPIC APPENDECTOMY N/A 12/02/2015   Procedure: APPENDECTOMY LAPAROSCOPIC;  Surgeon: Clovis Riley, MD;  Location: [facility];  Service: General;  Laterality: N/A;  . ROOT CANAL  2013  . ROOT CANAL    . VAGINAL HYSTERECTOMY     abdominal hysterectomy    There were no vitals filed for this visit.  Subjective Assessment - 03/19/18 1039    Subjective  L shoulder is ok today. Had pain in it yesterday. Did not know why. Maybe from riding in the car for a long time, might be the weather.  Felt pain L anterior proximal arm. Good today.  4/10 L shoulder pain at most for the past 7 days.  Donning and  doffing her shirt is ok. The reaching accross still bothers her.   The manual therapy for her L shoulder helps    Pertinent History  L shoulder and L hip pain. L shoulder bothers her more which keeps her from sleeping. Difficulty laying in her L side. Difficulty donning and doffing shirts and sweaters. Not sure how much of her pain is related to her cholesterol medicine. Has had reactions to her cholesterol medicines before.   L shoulder pain started about a little over a year ago which has gradually worsened. Also has a difficult time walking after sitting for a while.  L shoulder pain occured gradually. Only noticed L shoulder bothering her sleeping in the past few months.  Has not yet had imaging for her L shoulder and hip.  Pt is R hand dominant.  L hip pain began years ago which disappeared. L hip pain started bothering her consistently for the past 6 months particularly when pt tries to sleep.  Denies unexplained changes in weight.      Patient Stated Goals  Want to be able to keep moving.     Currently in Pain?  No/denies    Pain Score  0-No pain    Pain Onset  More than a month ago                               PT Education - 03/19/18 1304    Education Details  ther-ex    Person(s) Educated  Patient    Methods  Explanation;Demonstration;Tactile cues;Verbal cues    Comprehension  Returned demonstration;Verbalized understanding        Objectives   Medbridge Access Coe: [facility]  Denies personal history of CA or osteoporosis, no pacemakers or defibrillators. Denies latex band allergies   Therapeutic exercise   L shoulder IR resisting redband 10xwith 5 lbs at distal arm  Then green band 10x2  Seated manually resisted L scapular retraction targeting the lower trap muscle  For 3 sets of 10x5 seconds   Seated manually resisted L scapular depression 10x5 seconds   Reassess goals next visit   Improved exercise technique, movement at target  joints, use of target muscles after min to mod verbal, visual, tactile cues.     Manual Therapy  Supine with L arm in 90 degrees abduction Inferior glide L shoulder joint grade 3- to 3   No pain with reaching across afterwards   Seated STM L infraspinatus. No pulling L radial nerve when reaching across  Seated STM L upper trap  Seated STM R upper trap    Decreased L shoulder pain with reaching across following manual therapy to promote inferior glide L shoulder joint and decrease L infraspinatus muscle tension. Continued working on scapular strengthening as well to decrease acromion and coracoid process pressure to anterior shoulder muscles at rest and when performing movements with her L arm. Pt tolerated session well without aggravation of symptoms. Pt will benefit from continued skilled physical therapy services to decrease pain, improve strength and function.       PT Short Term Goals - 02/07/18 1613      PT SHORT TERM GOAL #1   Title  Patient will be independent with her HEP to decrease L shoulder and hip pain, improve strength and function.     Baseline  Pt has started her HEP (02/07/2018)     Time  3    Period  Weeks    Status  New    Target Date  03/01/18        PT Long Term Goals - 02/07/18 1615      PT LONG TERM GOAL #1   Title  Patient will have a decrease in L shoulder pain to 2/10 or less at worst to promote ability to don and doff her shirts and sweaters, as well as sleep on her L side.     Baseline  5/10 L shoulder pain at most for the past 3 months (02/07/2018)    Time  8    Period  Weeks    Status  New    Target Date  04/05/18      PT LONG TERM GOAL #2   Title  Patient will have a decrease in L hip pain to 3/10 or less at worst to promote ability to perform standing tasks such as stair negotiation.     Baseline  7/10 L hip pain at most for the past 3 months (02/07/2018)    Time  8    Period  Weeks    Status  New    Target Date   04/05/18  PT LONG TERM GOAL #3   Title  Patient will improve L shoulder ER strength by at least 1/2 MMT grade to promote ability to raise her arm and use it to perform functional tasks more comfortably.     Baseline  L shoulder ER 4-/5 (02/07/2018)    Time  8    Period  Weeks    Status  New    Target Date  04/05/18      PT LONG TERM GOAL #4   Title  Patient will improve L glute med and max strength by at least 1/2 MMT grade to promote ability to perform standing tasks such as stair negotiation more comfortably for her L hip.     Baseline  L hip extension 4-/5 (seated manually resisted), hip abduction 4/5 (seated manually resisted clamshell isometrics) 02/07/2018.     Time  8    Period  Weeks    Status  New    Target Date  04/05/18      PT LONG TERM GOAL #5   Title  Patient will improve her L shoulder FOTO score by at least 10 points as a demonstration of improved function.     Baseline  L shoulder FOTO 55 (02/07/2018)    Time  8    Period  Weeks    Status  New    Target Date  04/05/18      Additional Long Term Goals   Additional Long Term Goals  Yes      PT LONG TERM GOAL #6   Title  Patient will improve her Quick Dash score by at least 10% as a demonstration of improved function.     Baseline  29.5%  (02/07/2018)    Time  8    Period  Weeks    Status  New    Target Date  04/05/18            Plan - 03/19/18 1304    Clinical Impression Statement  Decreased L shoulder pain with reaching across following manual therapy to promote inferior glide L shoulder joint and decrease L infraspinatus muscle tension. Continued working on scapular strengthening as well to decrease acromion and coracoid process pressure to anterior shoulder muscles at rest and when performing movements with her L arm. Pt tolerated session well without aggravation of symptoms. Pt will benefit from continued skilled physical therapy services to decrease pain, improve strength and function.     Rehab  Potential  Fair    Clinical Impairments Affecting Rehab Potential  (-) Chronicity of condition, weakness, multiple areas of pain, age; (+) motivated    PT Frequency  2x / week    PT Duration  8 weeks    PT Treatment/Interventions  Therapeutic activities;Therapeutic exercise;Neuromuscular re-education;Patient/family education;Manual techniques;Dry needling;Aquatic Therapy;Electrical Stimulation;Iontophoresis 4mg /ml Dexamethasone;Ultrasound    PT Next Visit Plan  scapular, glute med, max, trunk strengthening, gentle infraspinatus muscle strengthening, manual techniques, modalities PRN    Consulted and Agree with Plan of Care  Patient       Patient will benefit from skilled therapeutic intervention in order to improve the following deficits and impairments:  Pain, Postural dysfunction, Improper body mechanics, Impaired UE functional use, Decreased strength, Decreased range of motion, Abnormal gait  Visit Diagnosis: Chronic left shoulder pain  Muscle weakness (generalized)  Pain in left hip     Problem List Patient Active Problem List   Diagnosis Date Noted  . DOE (dyspnea on exertion) 01/17/2018  . Proteinuria 01/17/2018  . Left  hip pain 01/17/2018  . Rotator cuff impingement syndrome of left shoulder 01/17/2018  . Myalgia 07/18/2017  . Neuropathy 01/13/2017  . Arrhythmia   . Palpitations   . Essential hypertension   . Right foot pain 03/17/2015  . Non-alcoholic fatty liver disease 03/03/2015  . Gallbladder attack 01/28/2015  . Thyroid nodule 01/26/2015  . CAD (coronary artery disease) 01/26/2015  . Tachycardia 07/30/2014  . Foot swelling 10/30/2012  . Obesity 09/05/2010  . Hyperlipidemia 04/14/2010    Joneen Boers PT, DPT   03/19/2018, 1:14 PM  [facility] [address], [place], [address] Phone: [phone]   Fax:  [phone]  Name: Dawn Herring MRN: [id number] Date of Birth:  [birth year]

## 2018-03-22 ENCOUNTER — Ambulatory Visit: Payer: Medicare Other

## 2018-03-22 DIAGNOSIS — G8929 Other chronic pain: Secondary | ICD-10-CM | POA: Diagnosis not present

## 2018-03-22 DIAGNOSIS — M25552 Pain in left hip: Secondary | ICD-10-CM

## 2018-03-22 DIAGNOSIS — M6281 Muscle weakness (generalized): Secondary | ICD-10-CM

## 2018-03-22 DIAGNOSIS — M25512 Pain in left shoulder: Principal | ICD-10-CM

## 2018-03-22 NOTE — Therapy (Signed)
[facility] [address], [place], [address] Phone: [phone]   Fax:  [phone]  Physical Therapy Treatment And Progress Report (02/07/2018 - 03/22/2018)   Patient Details  Name: Dawn Herring MRN: [id number] Date of Birth: 1945/09/22 Referring Provider (PT): Tommi Rumps, MD   Encounter Date: 03/22/2018  PT End of Session - 03/22/18 1347    Visit Number  10    Number of Visits  17    Date for PT Re-Evaluation  04/05/18    Authorization Type  10    Authorization Time Period  of 10 progress report    PT Start Time  2774    PT Stop Time  1432    PT Time Calculation (min)  44 min    Activity Tolerance  Patient tolerated treatment well    Behavior During Therapy  [facility] for tasks assessed/performed       Past Medical History:  Diagnosis Date  . Chest pain    a. Neg stress tests - 2005, 2008, 2009;  b. 08/2014 Cardiac CT: Ca score 193 (80th%'ile).  . Essential hypertension   . Hiatal hernia    a. 08/2014 CT chest: large hiatal hernia.  Marland Kitchen History of DVT (deep vein thrombosis)   . Hyperlipidemia    a. statin intolerant;  b. on zetia and repatha.  . Palpitations    a. 02/2015 Event Monitor: NSR w/ rare, short episodes of A Tach, rare short runs of SVT < 15 beats.    Past Surgical History:  Procedure Laterality Date  . CATARACT EXTRACTION, BILATERAL    . LAPAROSCOPIC APPENDECTOMY N/A 12/02/2015   Procedure: APPENDECTOMY LAPAROSCOPIC;  Surgeon: Clovis Riley, MD;  Location: [facility];  Service: General;  Laterality: N/A;  . ROOT CANAL  2013  . ROOT CANAL    . VAGINAL HYSTERECTOMY     abdominal hysterectomy    There were no vitals filed for this visit.  Subjective Assessment - 03/22/18 1350    Subjective  Used heat on her L shoulder last night after having guests. L shoulder did not bother her when dressing. No pain at rest. No pain when reaching across.  Did ok during Christmas.     Pertinent  History  L shoulder and L hip pain. L shoulder bothers her more which keeps her from sleeping. Difficulty laying in her L side. Difficulty donning and doffing shirts and sweaters. Not sure how much of her pain is related to her cholesterol medicine. Has had reactions to her cholesterol medicines before.   L shoulder pain started about a little over a year ago which has gradually worsened. Also has a difficult time walking after sitting for a while.  L shoulder pain occured gradually. Only noticed L shoulder bothering her sleeping in the past few months.  Has not yet had imaging for her L shoulder and hip.  Pt is R hand dominant.  L hip pain began years ago which disappeared. L hip pain started bothering her consistently for the past 6 months particularly when pt tries to sleep.  Denies unexplained changes in weight.      Patient Stated Goals  Want to be able to keep moving.     Currently in Pain?  No/denies    Pain Score  0-No pain    Pain Onset  More than a month ago         [facility] PT Assessment - 03/22/18 0001  Observation/Other Assessments   Focus on Therapeutic Outcomes (FOTO)   L shoulder FOTO: 64    Quick DASH   18.2%      Strength   Left Shoulder External Rotation  4+/5                           PT Education - 03/22/18 1708    Education Details  ther-ex    Person(s) Educated  Patient    Methods  Explanation;Demonstration;Verbal cues;Tactile cues    Comprehension  Returned demonstration;Verbalized understanding      Objectives   Medbridge Access Coe: [facility]  Denies personal history of CA or osteoporosis, no pacemakers or defibrillators. Denies latex band allergies   Therapeutic exercise  Manually resisted L shoulder ER 1x  4+/5  Seated manually resisted L hip extension, clamshell isometrics 1x each   Seated manually resisted L scapular depression 10x5 seconds for 3 sets    L shoulder IR resisting green band 10x2   Seated  manually resisted L scapular retraction targeting the lower trap muscle             For 3 sets of 10x5 seconds   Scapular retraction red band (upgrade HEP) 10x3 with 5 second holds   Improved exercise technique, movement at target joints, use of target muscles after mod verbal, visual, tactile cues.      Manual Therapy  Supine with L arm in 90 degrees abduction Inferior and posterior glide L shoulder joint grade 3-to 3              Seated STM L infraspinatus.    Pt demonstrates overall improved ability to don and doff shirts and sweaters as well as reach across her body with her arm more comfortably based on subjective reports and clinical observation. Pt also demonstrates improved L hip strength and decreased L hip pain and improved shoulder function since initial evaluation. Pt making progress with PT towards goals. Pt still demonstrates L shoulder pain, weakness, and difficulty performing functional tasks and would benefit from continued skilled physical therapy services to addresss the aforementioned deficits.       PT Short Term Goals - 03/22/18 1712      PT SHORT TERM GOAL #1   Title  Patient will be independent with her HEP to decrease L shoulder and hip pain, improve strength and function.     Baseline  Pt has started her HEP (02/07/2018); Pt performing her HEP (03/22/2018)    Time  3    Period  Weeks    Status  Achieved    Target Date  03/01/18        PT Long Term Goals - 03/22/18 1406      PT LONG TERM GOAL #1   Title  Patient will have a decrease in L shoulder pain to 2/10 or less at worst to promote ability to don and doff her shirts and sweaters, as well as sleep on her L side.     Baseline  5/10 L shoulder pain at most for the past 3 months (02/07/2018); 4/10 at most for the past 7 days (03/22/2018)    Time  8    Period  Weeks    Status  Partially Met    Target Date  04/05/18      PT LONG TERM GOAL #2   Title  Patient will have a  decrease in L hip pain to 3/10 or less at worst to promote   ability to perform standing tasks such as stair negotiation.     Baseline  7/10 L hip pain at most for the past 3 months (02/07/2018); 2/10 L hip at most for the past 7 days (03/22/2018)    Time  8    Period  Weeks    Status  Achieved    Target Date  04/05/18      PT LONG TERM GOAL #3   Title  Patient will improve L shoulder ER strength by at least 1/2 MMT grade to promote ability to raise her arm and use it to perform functional tasks more comfortably.     Baseline  L shoulder ER 4-/5 (02/07/2018); 4+/5 (03/22/2018)    Time  8    Period  Weeks    Status  Achieved    Target Date  04/05/18      PT LONG TERM GOAL #4   Title  Patient will improve L glute med and max strength by at least 1/2 MMT grade to promote ability to perform standing tasks such as stair negotiation more comfortably for her L hip.     Baseline  L hip extension 4-/5 (seated manually resisted), hip abduction 4/5 (seated manually resisted clamshell isometrics) 02/07/2018, L hip extension 4/5 (seated manually resisted), hip abduction 5/5 (seated manually resisted clamshell isometrics)     Time  8    Period  Weeks    Status  Achieved    Target Date  04/05/18      PT LONG TERM GOAL #5   Title  Patient will improve her L shoulder FOTO score by at least 10 points as a demonstration of improved function.     Baseline  L shoulder FOTO 55 (02/07/2018); 64 (03/22/2018)    Time  8    Period  Weeks    Status  Partially Met    Target Date  04/05/18      PT LONG TERM GOAL #6   Title  Patient will improve her Quick Dash score by at least 10% as a demonstration of improved function.     Baseline  29.5%  (02/07/2018); 18.2% (03/22/2018)    Time  8    Period  Weeks    Status  Achieved    Target Date  04/05/18            Plan - 03/22/18 1711    Clinical Impression Statement  Pt demonstrates overall improved ability to don and doff shirts and sweaters as well as  reach across her body with her arm more comfortably based on subjective reports and clinical observation. Pt also demonstrates improved L hip strength and decreased L hip pain and improved shoulder function since initial evaluation. Pt making progress with PT towards goals. Pt still demonstrates L shoulder pain, weakness, and difficulty performing functional tasks and would benefit from continued skilled physical therapy services to addresss the aforementioned deficits.     History and Personal Factors relevant to plan of care:  Chronicity of condition, L shoulder pain    Clinical Presentation  Stable    Clinical Presentation due to:  Pt making progress with PT towards goals.     Clinical Decision Making  Low    Rehab Potential  Fair    Clinical Impairments Affecting Rehab Potential  (-) Chronicity of condition, weakness, multiple areas of pain, age; (+) motivated    PT Frequency  2x / week    PT Duration  8 weeks    PT Treatment/Interventions  Therapeutic activities;Therapeutic exercise;Neuromuscular re-education;Patient/family education;Manual techniques;Dry needling;Aquatic Therapy;Electrical Stimulation;Iontophoresis 4mg/ml Dexamethasone;Ultrasound    PT Next Visit Plan  scapular, glute med, max, trunk strengthening, gentle infraspinatus muscle strengthening, manual techniques, modalities PRN    Consulted and Agree with Plan of Care  Patient       Patient will benefit from skilled therapeutic intervention in order to improve the following deficits and impairments:  Pain, Postural dysfunction, Improper body mechanics, Impaired UE functional use, Decreased strength, Decreased range of motion, Abnormal gait  Visit Diagnosis: Chronic left shoulder pain  Muscle weakness (generalized)  Pain in left hip     Problem List Patient Active Problem List   Diagnosis Date Noted  . DOE (dyspnea on exertion) 01/17/2018  . Proteinuria 01/17/2018  . Left hip pain 01/17/2018  . Rotator cuff  impingement syndrome of left shoulder 01/17/2018  . Myalgia 07/18/2017  . Neuropathy 01/13/2017  . Arrhythmia   . Palpitations   . Essential hypertension   . Right foot pain 03/17/2015  . Non-alcoholic fatty liver disease 03/03/2015  . Gallbladder attack 01/28/2015  . Thyroid nodule 01/26/2015  . CAD (coronary artery disease) 01/26/2015  . Tachycardia 07/30/2014  . Foot swelling 10/30/2012  . Obesity 09/05/2010  . Hyperlipidemia 04/14/2010     Thank you for your referral.   Miguel Laygo PT, DPT   03/22/2018, 5:19 PM  [facility] [address] [place], [place], [address] Phone: [phone]   Fax:  [phone]  Name: Adhya S Hulen MRN: [id number] Date of Birth: 06/05/1945   

## 2018-03-23 DIAGNOSIS — H26492 Other secondary cataract, left eye: Secondary | ICD-10-CM | POA: Diagnosis not present

## 2018-03-23 DIAGNOSIS — Z961 Presence of intraocular lens: Secondary | ICD-10-CM | POA: Diagnosis not present

## 2018-03-23 DIAGNOSIS — H5213 Myopia, bilateral: Secondary | ICD-10-CM | POA: Diagnosis not present

## 2018-03-26 ENCOUNTER — Ambulatory Visit: Payer: Medicare Other

## 2018-03-26 DIAGNOSIS — M6281 Muscle weakness (generalized): Secondary | ICD-10-CM

## 2018-03-26 DIAGNOSIS — M25512 Pain in left shoulder: Secondary | ICD-10-CM | POA: Diagnosis not present

## 2018-03-26 DIAGNOSIS — G8929 Other chronic pain: Secondary | ICD-10-CM

## 2018-03-26 DIAGNOSIS — M25552 Pain in left hip: Secondary | ICD-10-CM | POA: Diagnosis not present

## 2018-03-26 NOTE — Therapy (Signed)
[place] [facility] [address] [place], [address] Phone: [phone]   Fax:  [phone]  Physical Therapy Treatment  Patient Details  Name: Dawn Herring MRN: [id number] Date of Birth: 28-Jul-1945 Referring Provider (PT): Tommi Rumps, MD   Encounter Date: 03/26/2018  PT End of Session - 03/26/18 1029    Visit Number  11    Number of Visits  17    Date for PT Re-Evaluation  04/05/18    Authorization Type  1    Authorization Time Period  of 10 progress report    PT Start Time  1030    PT Stop Time  1114    PT Time Calculation (min)  44 min    Activity Tolerance  Patient tolerated treatment well    Behavior During Therapy  [facility] for tasks assessed/performed       Past Medical History:  Diagnosis Date  . Chest pain    a. Neg stress tests - 2005, 2008, 2009;  b. 08/2014 Cardiac CT: Ca score 193 (80th%'ile).  . Essential hypertension   . Hiatal hernia    a. 08/2014 CT chest: large hiatal hernia.  Marland Kitchen History of DVT (deep vein thrombosis)   . Hyperlipidemia    a. statin intolerant;  b. on zetia and repatha.  . Palpitations    a. 02/2015 Event Monitor: NSR w/ rare, short episodes of A Tach, rare short runs of SVT < 15 beats.    Past Surgical History:  Procedure Laterality Date  . CATARACT EXTRACTION, BILATERAL    . LAPAROSCOPIC APPENDECTOMY N/A 12/02/2015   Procedure: APPENDECTOMY LAPAROSCOPIC;  Surgeon: Clovis Riley, MD;  Location: [facility];  Service: General;  Laterality: N/A;  . ROOT CANAL  2013  . ROOT CANAL    . VAGINAL HYSTERECTOMY     abdominal hysterectomy    There were no vitals filed for this visit.  Subjective Assessment - 03/26/18 1032    Subjective  L shoulder is ok today. Donning and doffing a shirt fine. Feels a pull when reaching across.     Pertinent History  L shoulder and L hip pain. L shoulder bothers her more which keeps her from sleeping. Difficulty laying in her L side. Difficulty  donning and doffing shirts and sweaters. Not sure how much of her pain is related to her cholesterol medicine. Has had reactions to her cholesterol medicines before.   L shoulder pain started about a little over a year ago which has gradually worsened. Also has a difficult time walking after sitting for a while.  L shoulder pain occured gradually. Only noticed L shoulder bothering her sleeping in the past few months.  Has not yet had imaging for her L shoulder and hip.  Pt is R hand dominant.  L hip pain began years ago which disappeared. L hip pain started bothering her consistently for the past 6 months particularly when pt tries to sleep.  Denies unexplained changes in weight.      Patient Stated Goals  Want to be able to keep moving.     Currently in Pain?  No/denies    Pain Score  0-No pain    Pain Onset  More than a month ago                               PT Education - 03/26/18 1054    Education Details  ther-ex    Person(s) Educated  Patient    Methods  Explanation;Demonstration;Tactile cues;Verbal cues    Comprehension  Verbalized understanding;Returned demonstration      Objectives   Medbridge Access Coe: [facility]  Denies personal history of CA or osteoporosis, no pacemakers or defibrillators. Denies latex band allergies    Manual Therapy  Supine with L arm in 90 degrees abduction Inferior glide L shoulder joint grade 3-to 3  Seated STM Linfraspinatus.    Therapeutic exercise   Seated manually resisted L scapular depression 10x5 seconds for 3 sets   Seated manually resisted L scapular retraction targeting the lower trap muscles   L shoulder IR resisting green band 10x3  Give as part of her HEP next visit  Seated press-ups 10x to promote scapular strengthening.  Rows at [facility] machine plate 10 for 11A5  Seated chin tucks to promote thoracic extension and scapular retraction 10x5 seconds  Seated thoracic  extension over chair 10x5 seconds for 2 sets  Seated open books 10x2 with 5 second holds  Try posterior deltoid strengthening next visit if appropriate.     Improved exercise technique, movement at target joints, use of target muscles after min to mod verbal, visual, tactile cues.   Continued working on improving inferior L shoulder joint mobility, decreasing tension to L infraspinatus muscle, improving scapular strength and thoracic extension (to promote scapular retraction and posterior tipping). No complain of L shoulder pain with reaching across as well as donning and doffing shirt motion after session. Pt will benefit from continued skilled physical therapy services to decrease pain, improve strength and function.                     PT Short Term Goals - 03/22/18 1712      PT SHORT TERM GOAL #1   Title  Patient will be independent with her HEP to decrease L shoulder and hip pain, improve strength and function.     Baseline  Pt has started her HEP (02/07/2018); Pt performing her HEP (03/22/2018)    Time  3    Period  Weeks    Status  Achieved    Target Date  03/01/18        PT Long Term Goals - 03/22/18 1406      PT LONG TERM GOAL #1   Title  Patient will have a decrease in L shoulder pain to 2/10 or less at worst to promote ability to don and doff her shirts and sweaters, as well as sleep on her L side.     Baseline  5/10 L shoulder pain at most for the past 3 months (02/07/2018); 4/10 at most for the past 7 days (03/22/2018)    Time  8    Period  Weeks    Status  Partially Met    Target Date  04/05/18      PT LONG TERM GOAL #2   Title  Patient will have a decrease in L hip pain to 3/10 or less at worst to promote ability to perform standing tasks such as stair negotiation.     Baseline  7/10 L hip pain at most for the past 3 months (02/07/2018); 2/10 L hip at most for the past 7 days (03/22/2018)    Time  8    Period  Weeks    Status  Achieved     Target Date  04/05/18      PT LONG TERM GOAL #3   Title  Patient will improve L shoulder ER strength by at least 1/2 MMT grade to promote ability to raise her arm and use it to perform functional tasks more comfortably.     Baseline  L shoulder ER 4-/5 (02/07/2018); 4+/5 (03/22/2018)    Time  8    Period  Weeks    Status  Achieved    Target Date  04/05/18      PT LONG TERM GOAL #4   Title  Patient will improve L glute med and max strength by at least 1/2 MMT grade to promote ability to perform standing tasks such as stair negotiation more comfortably for her L hip.     Baseline  L hip extension 4-/5 (seated manually resisted), hip abduction 4/5 (seated manually resisted clamshell isometrics) 02/07/2018, L hip extension 4/5 (seated manually resisted), hip abduction 5/5 (seated manually resisted clamshell isometrics)     Time  8    Period  Weeks    Status  Achieved    Target Date  04/05/18      PT LONG TERM GOAL #5   Title  Patient will improve her L shoulder FOTO score by at least 10 points as a demonstration of improved function.     Baseline  L shoulder FOTO 55 (02/07/2018); 64 (03/22/2018)    Time  8    Period  Weeks    Status  Partially Met    Target Date  04/05/18      PT LONG TERM GOAL #6   Title  Patient will improve her Quick Dash score by at least 10% as a demonstration of improved function.     Baseline  29.5%  (02/07/2018); 18.2% (03/22/2018)    Time  8    Period  Weeks    Status  Achieved    Target Date  04/05/18            Plan - 03/26/18 1029    Clinical Impression Statement  Continued working on improving inferior L shoulder joint mobility, decreasing tension to L infraspinatus muscle, improving scapular strength and thoracic extension (to promote scapular retraction and posterior tipping). No complain of L shoulder pain with reaching across as well as donning and doffing shirt motion after session. Pt will benefit from continued skilled physical therapy  services to decrease pain, improve strength and function.     Rehab Potential  Fair    Clinical Impairments Affecting Rehab Potential  (-) Chronicity of condition, weakness, multiple areas of pain, age; (+) motivated    PT Frequency  2x / week    PT Duration  8 weeks    PT Treatment/Interventions  Therapeutic activities;Therapeutic exercise;Neuromuscular re-education;Patient/family education;Manual techniques;Dry needling;Aquatic Therapy;Electrical Stimulation;Iontophoresis 31m/ml Dexamethasone;Ultrasound    PT Next Visit Plan  scapular, glute med, max, trunk strengthening, gentle infraspinatus muscle strengthening, manual techniques, modalities PRN    Consulted and Agree with Plan of Care  Patient       Patient will benefit from skilled therapeutic intervention in order to improve the following deficits and impairments:  Pain, Postural dysfunction, Improper body mechanics, Impaired UE functional use, Decreased strength, Decreased range of motion, Abnormal gait  Visit Diagnosis: Chronic left shoulder pain  Muscle weakness (generalized)  Pain in left hip     Problem List Patient Active Problem List   Diagnosis Date Noted  . DOE (dyspnea on exertion) 01/17/2018  . Proteinuria 01/17/2018  . Left hip pain 01/17/2018  . Rotator cuff impingement syndrome of left shoulder 01/17/2018  . Myalgia  07/18/2017  . Neuropathy 01/13/2017  . Arrhythmia   . Palpitations   . Essential hypertension   . Right foot pain 03/17/2015  . Non-alcoholic fatty liver disease 03/03/2015  . Gallbladder attack 01/28/2015  . Thyroid nodule 01/26/2015  . CAD (coronary artery disease) 01/26/2015  . Tachycardia 07/30/2014  . Foot swelling 10/30/2012  . Obesity 09/05/2010  . Hyperlipidemia 04/14/2010    Joneen Boers PT, DPT   03/26/2018, 11:31 AM  [facility] [address], [place], [address] Phone: [phone]   Fax:   [phone]  Name: Dawn Herring MRN: [id number] Date of Birth: 1946-01-17

## 2018-03-29 ENCOUNTER — Ambulatory Visit: Payer: Medicare Other | Attending: Family Medicine

## 2018-03-29 DIAGNOSIS — M25512 Pain in left shoulder: Secondary | ICD-10-CM | POA: Diagnosis not present

## 2018-03-29 DIAGNOSIS — G8929 Other chronic pain: Secondary | ICD-10-CM

## 2018-03-29 DIAGNOSIS — M25552 Pain in left hip: Secondary | ICD-10-CM | POA: Insufficient documentation

## 2018-03-29 DIAGNOSIS — M6281 Muscle weakness (generalized): Secondary | ICD-10-CM | POA: Diagnosis not present

## 2018-03-29 NOTE — Patient Instructions (Addendum)
Medbridge Access Coe: [facility]  Sleeper Stretch  5x3 with 5 seconds    Shoulder Internal Rotation with Resistance  10x2 with green band

## 2018-03-29 NOTE — Therapy (Signed)
[facility] [address] [place], [address] Phone: [phone]   Fax:  [phone]  Physical Therapy Treatment  Patient Details  Name: Dawn Herring MRN: [id number] Date of Birth: 12-18-1945 Referring Provider (PT): Tommi Rumps, MD   Encounter Date: 03/29/2018  PT End of Session - 03/29/18 1037    Visit Number  12    Number of Visits  17    Date for PT Re-Evaluation  04/05/18    Authorization Type  2    Authorization Time Period  of 10 progress report    PT Start Time  1037    PT Stop Time  1117    PT Time Calculation (min)  40 min    Activity Tolerance  Patient tolerated treatment well    Behavior During Therapy  [facility] for tasks assessed/performed       Past Medical History:  Diagnosis Date  . Chest pain    a. Neg stress tests - 2005, 2008, 2009;  b. 08/2014 Cardiac CT: Ca score 193 (80th%'ile).  . Essential hypertension   . Hiatal hernia    a. 08/2014 CT chest: large hiatal hernia.  Marland Kitchen History of DVT (deep vein thrombosis)   . Hyperlipidemia    a. statin intolerant;  b. on zetia and repatha.  . Palpitations    a. 02/2015 Event Monitor: NSR w/ rare, short episodes of A Tach, rare short runs of SVT < 15 beats.    Past Surgical History:  Procedure Laterality Date  . CATARACT EXTRACTION, BILATERAL    . LAPAROSCOPIC APPENDECTOMY N/A 12/02/2015   Procedure: APPENDECTOMY LAPAROSCOPIC;  Surgeon: Clovis Riley, MD;  Location: [facility];  Service: General;  Laterality: N/A;  . ROOT CANAL  2013  . ROOT CANAL    . VAGINAL HYSTERECTOMY     abdominal hysterectomy    There were no vitals filed for this visit.  Subjective Assessment - 03/29/18 1039    Subjective  L shoulder was bad last night. Does not know why. Did not do anything yesterday (6/10 when trying to go to sleep) that she though aggravated it, went away. Pain began when she was in bed laying down on her L side. Pt is a L sided sleeper. Feels  fine today.  No pain with reaching across or donning and doffing shirt motion today.     Pertinent History  L shoulder and L hip pain. L shoulder bothers her more which keeps her from sleeping. Difficulty laying in her L side. Difficulty donning and doffing shirts and sweaters. Not sure how much of her pain is related to her cholesterol medicine. Has had reactions to her cholesterol medicines before.   L shoulder pain started about a little over a year ago which has gradually worsened. Also has a difficult time walking after sitting for a while.  L shoulder pain occured gradually. Only noticed L shoulder bothering her sleeping in the past few months.  Has not yet had imaging for her L shoulder and hip.  Pt is R hand dominant.  L hip pain began years ago which disappeared. L hip pain started bothering her consistently for the past 6 months particularly when pt tries to sleep.  Denies unexplained changes in weight.      Patient Stated Goals  Want to be able to keep moving.     Currently in Pain?  No/denies    Pain Score  0-No pain    Pain Onset  More than a month ago                               PT Education - 03/29/18 1633    Education Details  ther-ex    Person(s) Educated  Patient    Methods  Explanation;Demonstration;Tactile cues;Verbal cues    Comprehension  Returned demonstration;Verbalized understanding       Objectives   Medbridge Access Coe: [facility]  Denies personal history of CA or osteoporosis, no pacemakers or defibrillators. Denies latex band allergies     Therapeutic exercise  S/L L shoulder Sleeper stretch 5x5 seconds for 3 sets  Supine L shoulder circles at 90 degrees flexion, 2 lbs   30 seconds clockwise,   30 seconds counter clockwise  Supine with L shoulder in 90 degrees flexion   rhythmic stabilization with PT 1 minute x 2   L anterior lateral arm discomfort which eases with scapular retraction  Supine serratus reach 10x5  seconds   Then with 2 lbs 10x5 seconds  B shoulder extension at [facility] machine  Plate 10 for 4x. L shoulder discomfort  Plate 5 for 59F. No L shoulder discomfort.   Then plate 5 with 2 lbs dumbbell 10x  Push-ups at treadmill bar 5x2  Rows at [facility] machine plate 10 for 63W4  L shoulder IR resistinggreen band 10x2  Seated open books 10x2 with 5 second holds    Improved exercise technique, movement at target joints, use of target muscles after min to mod verbal, visual, tactile cues.    No complain of L shoulder pain with reaching across and donning and doffing shirt motion today sugesting overall good cary over of progress. Added S/L L shoulder sleeper stretch as part of her HEP to help promote ability to lie down and sleep on her L side. Pt tolerated session well without aggravation of symptoms. Pt will benefit from continued skilled physical therapy services to decrease pain, improve strength and function.         PT Short Term Goals - 03/22/18 1712      PT SHORT TERM GOAL #1   Title  Patient will be independent with her HEP to decrease L shoulder and hip pain, improve strength and function.     Baseline  Pt has started her HEP (02/07/2018); Pt performing her HEP (03/22/2018)    Time  3    Period  Weeks    Status  Achieved    Target Date  03/01/18        PT Long Term Goals - 03/22/18 1406      PT LONG TERM GOAL #1   Title  Patient will have a decrease in L shoulder pain to 2/10 or less at worst to promote ability to don and doff her shirts and sweaters, as well as sleep on her L side.     Baseline  5/10 L shoulder pain at most for the past 3 months (02/07/2018); 4/10 at most for the past 7 days (03/22/2018)    Time  8    Period  Weeks    Status  Partially Met    Target Date  04/05/18      PT LONG TERM GOAL #2   Title  Patient will have a decrease in L hip pain to 3/10 or less at worst to promote ability to perform standing tasks such as stair negotiation.      Baseline  7/10 L hip pain  at most for the past 3 months (02/07/2018); 2/10 L hip at most for the past 7 days (03/22/2018)    Time  8    Period  Weeks    Status  Achieved    Target Date  04/05/18      PT LONG TERM GOAL #3   Title  Patient will improve L shoulder ER strength by at least 1/2 MMT grade to promote ability to raise her arm and use it to perform functional tasks more comfortably.     Baseline  L shoulder ER 4-/5 (02/07/2018); 4+/5 (03/22/2018)    Time  8    Period  Weeks    Status  Achieved    Target Date  04/05/18      PT LONG TERM GOAL #4   Title  Patient will improve L glute med and max strength by at least 1/2 MMT grade to promote ability to perform standing tasks such as stair negotiation more comfortably for her L hip.     Baseline  L hip extension 4-/5 (seated manually resisted), hip abduction 4/5 (seated manually resisted clamshell isometrics) 02/07/2018, L hip extension 4/5 (seated manually resisted), hip abduction 5/5 (seated manually resisted clamshell isometrics)     Time  8    Period  Weeks    Status  Achieved    Target Date  04/05/18      PT LONG TERM GOAL #5   Title  Patient will improve her L shoulder FOTO score by at least 10 points as a demonstration of improved function.     Baseline  L shoulder FOTO 55 (02/07/2018); 64 (03/22/2018)    Time  8    Period  Weeks    Status  Partially Met    Target Date  04/05/18      PT LONG TERM GOAL #6   Title  Patient will improve her Quick Dash score by at least 10% as a demonstration of improved function.     Baseline  29.5%  (02/07/2018); 18.2% (03/22/2018)    Time  8    Period  Weeks    Status  Achieved    Target Date  04/05/18            Plan - 03/29/18 1633    Clinical Impression Statement  No complain of L shoulder pain with reaching across and donning and doffing shirt motion today sugesting overall good cary over of progress. Added S/L L shoulder sleeper stretch as part of her HEP to help promote  ability to lie down and sleep on her L side. Pt tolerated session well without aggravation of symptoms. Pt will benefit from continued skilled physical therapy services to decrease pain, improve strength and function.     Rehab Potential  Fair    Clinical Impairments Affecting Rehab Potential  (-) Chronicity of condition, weakness, multiple areas of pain, age; (+) motivated    PT Frequency  2x / week    PT Duration  8 weeks    PT Treatment/Interventions  Therapeutic activities;Therapeutic exercise;Neuromuscular re-education;Patient/family education;Manual techniques;Dry needling;Aquatic Therapy;Electrical Stimulation;Iontophoresis 70m/ml Dexamethasone;Ultrasound    PT Next Visit Plan  scapular, glute med, max, trunk strengthening, gentle infraspinatus muscle strengthening, manual techniques, modalities PRN    Consulted and Agree with Plan of Care  Patient       Patient will benefit from skilled therapeutic intervention in order to improve the following deficits and impairments:  Pain, Postural dysfunction, Improper body mechanics, Impaired UE functional use, Decreased strength, Decreased  range of motion, Abnormal gait  Visit Diagnosis: Chronic left shoulder pain  Muscle weakness (generalized)     Problem List Patient Active Problem List   Diagnosis Date Noted  . DOE (dyspnea on exertion) 01/17/2018  . Proteinuria 01/17/2018  . Left hip pain 01/17/2018  . Rotator cuff impingement syndrome of left shoulder 01/17/2018  . Myalgia 07/18/2017  . Neuropathy 01/13/2017  . Arrhythmia   . Palpitations   . Essential hypertension   . Right foot pain 03/17/2015  . Non-alcoholic fatty liver disease 03/03/2015  . Gallbladder attack 01/28/2015  . Thyroid nodule 01/26/2015  . CAD (coronary artery disease) 01/26/2015  . Tachycardia 07/30/2014  . Foot swelling 10/30/2012  . Obesity 09/05/2010  . Hyperlipidemia 04/14/2010    Joneen Boers PT, DPT   03/29/2018, 4:45 PM  [facility] [address], [place], [address] Phone: [phone]   Fax:  [phone]  Name: Dawn Herring MRN: [id number] Date of Birth: 11/27/1945

## 2018-04-02 ENCOUNTER — Ambulatory Visit: Payer: Medicare Other

## 2018-04-02 DIAGNOSIS — M25512 Pain in left shoulder: Principal | ICD-10-CM

## 2018-04-02 DIAGNOSIS — M25552 Pain in left hip: Secondary | ICD-10-CM | POA: Diagnosis not present

## 2018-04-02 DIAGNOSIS — M6281 Muscle weakness (generalized): Secondary | ICD-10-CM | POA: Diagnosis not present

## 2018-04-02 DIAGNOSIS — G8929 Other chronic pain: Secondary | ICD-10-CM

## 2018-04-02 NOTE — Therapy (Signed)
[facility] [address] [place], [address] Phone: [phone]   Fax:  [phone]  Physical Therapy Treatment  Patient Details  Name: Dawn Herring MRN: [id number] Date of Birth: [birth year] Referring Provider (PT): Tommi Rumps, MD   Encounter Date: 04/02/2018  PT End of Session - 04/02/18 1303    Visit Number  13    Number of Visits  17    Date for PT Re-Evaluation  04/05/18    Authorization Type  3    Authorization Time Period  of 10 progress report    PT Start Time  1303    PT Stop Time  1348    PT Time Calculation (min)  45 min    Activity Tolerance  Patient tolerated treatment well    Behavior During Therapy  [facility] for tasks assessed/performed       Past Medical History:  Diagnosis Date  . Chest pain    a. Neg stress tests - 2005, 2008, 2009;  b. 08/2014 Cardiac CT: Ca score 193 (80th%'ile).  . Essential hypertension   . Hiatal hernia    a. 08/2014 CT chest: large hiatal hernia.  Marland Kitchen History of DVT (deep vein thrombosis)   . Hyperlipidemia    a. statin intolerant;  b. on zetia and repatha.  . Palpitations    a. 02/2015 Event Monitor: NSR w/ rare, short episodes of A Tach, rare short runs of SVT < 15 beats.    Past Surgical History:  Procedure Laterality Date  . CATARACT EXTRACTION, BILATERAL    . LAPAROSCOPIC APPENDECTOMY N/A 12/02/2015   Procedure: APPENDECTOMY LAPAROSCOPIC;  Surgeon: Clovis Riley, MD;  Location: [facility];  Service: General;  Laterality: N/A;  . ROOT CANAL  2013  . ROOT CANAL    . VAGINAL HYSTERECTOMY     abdominal hysterectomy    There were no vitals filed for this visit.  Subjective Assessment - 04/02/18 1305    Subjective  L shoulder is ok today. Bothered her the night before last (Saturday night) when trying to lie down on her L side. Able to lie down on her L side the other nights. Other than that, it's been ok.  2/10 at worst for the past 7 days minus laying down  on her L side (5/10 Saturday night when she laid down on her L side).  Has not yet down her S/L sleeper stretch.  Did not do her L UE weight shift exercise Saturday night.     Pertinent History  L shoulder and L hip pain. L shoulder bothers her more which keeps her from sleeping. Difficulty laying in her L side. Difficulty donning and doffing shirts and sweaters. Not sure how much of her pain is related to her cholesterol medicine. Has had reactions to her cholesterol medicines before.   L shoulder pain started about a little over a year ago which has gradually worsened. Also has a difficult time walking after sitting for a while.  L shoulder pain occured gradually. Only noticed L shoulder bothering her sleeping in the past few months.  Has not yet had imaging for her L shoulder and hip.  Pt is R hand dominant.  L hip pain began years ago which disappeared. L hip pain started bothering her consistently for the past 6 months particularly when pt tries to sleep.  Denies unexplained changes in weight.      Patient Stated Goals  Want to be able to keep  moving.     Currently in Pain?  Yes    Pain Score  4    when reaching across.    Pain Onset  More than a month ago                               PT Education - 04/02/18 1312    Education Details  ther-ex    Person(s) Educated  Patient    Methods  Explanation;Demonstration;Tactile cues;Verbal cues    Comprehension  Returned demonstration;Verbalized understanding         Objectives   Medbridge Access Coe: [facility]  Denies personal history of CA or osteoporosis, no pacemakers or defibrillators. Denies latex band allergies   Therapeutic exercise  S/L L shoulder sleeper stretch 5x5 seconds for 3 sets  discomfort during the return motion at first which eased with increased repetition  Standing B shoulder ER with scapular retraction yellow band 10x3  L shoulder IR resistinggreen band 10x3  Prone on elbow  position  Arch and sag to promote posterior mobility L shoulder 10x2 with 5 second holds   Supine L shoulder ER   disomfort  Then with gentled distractoin: less discomfort  Then with axial compression: no discomfort   10x5 seconds for 2 sets  Pt education on shoulder anatomy, and impingement  Improved exercise technique, movement at target joints, use of target muscles after min to mod verbal, visual, tactile cues.    Manual Therapy  Supine with L arm in 90 degrees abduction Inferiorglide L shoulder joint grade 3-to 3   Decreased L shoulder pain with reaching across following treatment to promote shoulder joint mobility. Pt will benefit from continued skilled physical therapy services to decrease shoulder pain, improve strength and function.       PT Short Term Goals - 03/22/18 1712      PT SHORT TERM GOAL #1   Title  Patient will be independent with her HEP to decrease L shoulder and hip pain, improve strength and function.     Baseline  Pt has started her HEP (02/07/2018); Pt performing her HEP (03/22/2018)    Time  3    Period  Weeks    Status  Achieved    Target Date  03/01/18        PT Long Term Goals - 03/22/18 1406      PT LONG TERM GOAL #1   Title  Patient will have a decrease in L shoulder pain to 2/10 or less at worst to promote ability to don and doff her shirts and sweaters, as well as sleep on her L side.     Baseline  5/10 L shoulder pain at most for the past 3 months (02/07/2018); 4/10 at most for the past 7 days (03/22/2018)    Time  8    Period  Weeks    Status  Partially Met    Target Date  04/05/18      PT LONG TERM GOAL #2   Title  Patient will have a decrease in L hip pain to 3/10 or less at worst to promote ability to perform standing tasks such as stair negotiation.     Baseline  7/10 L hip pain at most for the past 3 months (02/07/2018); 2/10 L hip at most for the past 7 days (03/22/2018)    Time  8    Period  Weeks     Status  Achieved  Target Date  04/05/18      PT LONG TERM GOAL #3   Title  Patient will improve L shoulder ER strength by at least 1/2 MMT grade to promote ability to raise her arm and use it to perform functional tasks more comfortably.     Baseline  L shoulder ER 4-/5 (02/07/2018); 4+/5 (03/22/2018)    Time  8    Period  Weeks    Status  Achieved    Target Date  04/05/18      PT LONG TERM GOAL #4   Title  Patient will improve L glute med and max strength by at least 1/2 MMT grade to promote ability to perform standing tasks such as stair negotiation more comfortably for her L hip.     Baseline  L hip extension 4-/5 (seated manually resisted), hip abduction 4/5 (seated manually resisted clamshell isometrics) 02/07/2018, L hip extension 4/5 (seated manually resisted), hip abduction 5/5 (seated manually resisted clamshell isometrics)     Time  8    Period  Weeks    Status  Achieved    Target Date  04/05/18      PT LONG TERM GOAL #5   Title  Patient will improve her L shoulder FOTO score by at least 10 points as a demonstration of improved function.     Baseline  L shoulder FOTO 55 (02/07/2018); 64 (03/22/2018)    Time  8    Period  Weeks    Status  Partially Met    Target Date  04/05/18      PT LONG TERM GOAL #6   Title  Patient will improve her Quick Dash score by at least 10% as a demonstration of improved function.     Baseline  29.5%  (02/07/2018); 18.2% (03/22/2018)    Time  8    Period  Weeks    Status  Achieved    Target Date  04/05/18            Plan - 04/02/18 1313    Clinical Impression Statement  Decreased L shoulder pain with reaching across following treatment to promote shoulder joint mobility. Pt will benefit from continued skilled physical therapy services to decrease shoulder pain, improve strength and function.     Rehab Potential  Fair    Clinical Impairments Affecting Rehab Potential  (-) Chronicity of condition, weakness, multiple areas of pain, age;  (+) motivated    PT Frequency  2x / week    PT Duration  8 weeks    PT Treatment/Interventions  Therapeutic activities;Therapeutic exercise;Neuromuscular re-education;Patient/family education;Manual techniques;Dry needling;Aquatic Therapy;Electrical Stimulation;Iontophoresis 50m/ml Dexamethasone;Ultrasound    PT Next Visit Plan  scapular, glute med, max, trunk strengthening, gentle infraspinatus muscle strengthening, manual techniques, modalities PRN    Consulted and Agree with Plan of Care  Patient       Patient will benefit from skilled therapeutic intervention in order to improve the following deficits and impairments:  Pain, Postural dysfunction, Improper body mechanics, Impaired UE functional use, Decreased strength, Decreased range of motion, Abnormal gait  Visit Diagnosis: Chronic left shoulder pain  Muscle weakness (generalized)     Problem List Patient Active Problem List   Diagnosis Date Noted  . DOE (dyspnea on exertion) 01/17/2018  . Proteinuria 01/17/2018  . Left hip pain 01/17/2018  . Rotator cuff impingement syndrome of left shoulder 01/17/2018  . Myalgia 07/18/2017  . Neuropathy 01/13/2017  . Arrhythmia   . Palpitations   . Essential hypertension   .  Right foot pain 03/17/2015  . Non-alcoholic fatty liver disease 03/03/2015  . Gallbladder attack 01/28/2015  . Thyroid nodule 01/26/2015  . CAD (coronary artery disease) 01/26/2015  . Tachycardia 07/30/2014  . Foot swelling 10/30/2012  . Obesity 09/05/2010  . Hyperlipidemia 04/14/2010    Joneen Boers PT, DPT   04/02/2018, 7:02 PM  [facility] [address] [place], [address] Phone: [phone]   Fax:  [phone]  Name: LINDSY CERULLO MRN: [id number] Date of Birth: [birth year]

## 2018-04-03 NOTE — Progress Notes (Signed)
Cardiology Office Note  Date:  04/04/2018   ID:  Dawn, Herring 29-Jan-1946, MRN [id number]  PCP:  Dawn Haven, MD   Chief Complaint  Patient presents with  . other    12 mo follow up. Medicaitons reviewed verbally.     HPI:  [name] is a very pleasant 73 year old woman with past medical history of hypertension,  familial hypercholesterolemia,  chest pain and atrial tachycardia,  previous stress test in 2005, also in 2008 or 2009  Prior CT coronary calcium score  190,  started on more aggressive cholesterol regiment at that time PVCs seen on prior EKGs Event monitor December 2016 showing APCs, runs of  atrial tachycardia Who presents for routine followup of her hyperlipidemia hypertension, and arrhythmia  No sleeping well Lost her father 1 year ago, does not think it is stress  Shoulder pain, neck issue, doing PT  Periodic back pain wakes her up at night Notices arrhythmia when she has episodes of back pain "fast rhythm", previously diagnosed with atrial tachycardia 3-4 times a year, Sits up in a recliner until symptoms resolve Symptoms typically last 1 hr, rate 120-130 mostly, sometimes up to 150 Takes flecainide and symptoms go away Sometimes propranolol Sometimes takes Aleve for back pain  Needs help with praluent assistance Brings paperwork with her  SOB with climbing steps No regular exercise  EKG personally reviewed by myself on todays visit Shows sinus bradycardia rate 56 bpm T wave abnormality V3 through V6 1 and aVL  Father with dementia Lost mother in the 39s, Lost brother from heart disease  Other past medical history reviewed Cataract surgery: sept 26th, 2017 Second cataract surgery  last Thursday, anesthesia noted irregular rhythm She did not notice any symptoms Reports that she has not felt well since that time, feels weak  Off pralunt, changed to repatha,  She received samples from the office, did not the to fill her prescription  despite aggressive marketing from [facility]. Was told that they would cancel her prescription. She was paying $600 per month and reports that she was able to afford this. Prescription now canceled? Has been without medication for several months  Previous 30 day monitor again reviewed with her showing runs of atrial tachycardia, run of SVT, APCs, PVCs  Previously had significant myalgias on the fluvastatin  Symptoms improved by holding the medication Previously had gallbladder attack  and in the setting of severe pain, had tachycardia concerning for arrhythmia  Prior episode of tachycardia concerning for arrhythmia in the setting of back pain in the past  Strong family history of familial hypercholesterolemia.  She has tried numerous cholesterol medications in the past including Crestor, Lipitor, lovastatin. She did not tolerate these secondary to side effects.    PMH:   has a past medical history of Chest pain, Essential hypertension, Hiatal hernia, History of DVT (deep vein thrombosis), Hyperlipidemia, and Palpitations.  PSH:    Past Surgical History:  Procedure Laterality Date  . CATARACT EXTRACTION, BILATERAL    . LAPAROSCOPIC APPENDECTOMY N/A 12/02/2015   Procedure: APPENDECTOMY LAPAROSCOPIC;  Surgeon: Clovis Riley, MD;  Location: [facility];  Service: General;  Laterality: N/A;  . ROOT CANAL  2013  . ROOT CANAL    . VAGINAL HYSTERECTOMY     abdominal hysterectomy    Current Outpatient Medications  Medication Sig Dispense Refill  . Alirocumab (PRALUENT) 75 MG/ML SOPN Inject 1 pen into the skin every 14 (fourteen) days. 2 pen 11  . aspirin 81  MG tablet Take 162 mg by mouth daily.    Marland Kitchen ezetimibe (ZETIA) 10 MG tablet Take 1 tablet (10 mg total) by mouth daily. 90 tablet 3  . losartan-hydrochlorothiazide (HYZAAR) 100-12.5 MG tablet Take 1 tablet by mouth daily. 90 tablet 3  . omeprazole (PRILOSEC) 20 MG capsule Take 20 mg by mouth daily.      . propranolol (INDERAL) 10  MG tablet TAKE 1 TABLET BY MOUTH THREE TIMES A DAY (Patient taking differently: at bedtime. ) 270 tablet 2  . colchicine 0.6 MG tablet Take 1 tablet (0.6 mg total) by mouth 2 (two) times daily as needed. As needed for gout flare (Patient not taking: Reported on 04/04/2018) 30 tablet 1  . flecainide (TAMBOCOR) 50 MG tablet Take 1 tablet (50 mg total) by mouth 2 (two) times daily as needed. (Patient not taking: Reported on 04/04/2018) 60 tablet 6   No current facility-administered medications for this visit.      Allergies:   Statins   Social History:  The patient  reports that she has never smoked. She has never used smokeless tobacco. She reports that she does not drink alcohol or use drugs.   Family History:   family history includes Colon cancer in her unknown relative; Heart disease in her brother, father, and mother; Hyperlipidemia in her sister; Stroke in her brother; Thyroid cancer in her brother.    Review of Systems: Review of Systems  Constitutional: Negative.   Respiratory: Negative.   Cardiovascular:       Episodes of tachycardia  Gastrointestinal: Negative.   Musculoskeletal: Negative.   Neurological: Negative.   Psychiatric/Behavioral: The patient has insomnia.   All other systems reviewed and are negative.    PHYSICAL EXAM: VS:  BP 130/78 (BP Location: Left Arm, Patient Position: Sitting, Cuff Size: Normal)   Pulse (!) 56   Ht 5\' 6"  (1.676 m)   Wt 194 lb (88 kg)   BMI 31.31 kg/m  , BMI Body mass index is 31.31 kg/m. Constitutional:  oriented to person, place, and time. No distress.  HENT:  Head: Grossly normal Eyes:  no discharge. No scleral icterus.  Neck: No JVD, no carotid bruits  Cardiovascular: Regular rate and rhythm, no murmurs appreciated Pulmonary/Chest: Clear to auscultation bilaterally, no wheezes or rails Abdominal: Soft.  no distension.  no tenderness.  Musculoskeletal: Normal range of motion Neurological:  normal muscle tone. Coordination normal.  No atrophy Skin: Skin warm and dry Psychiatric: normal affect, pleasant  Recent Labs: 01/17/2018: ALT 14; BUN 21; Creatinine, Ser 1.00; Hemoglobin 13.5; Platelets 263.0; Potassium 3.6; Sodium 137; TSH 1.81    Lipid Panel Lab Results  Component Value Date   CHOL 157 07/21/2017   HDL 54.20 07/21/2017   LDLCALC 72 07/21/2017   TRIG 153.0 (H) 07/21/2017      Wt Readings from Last 3 Encounters:  04/04/18 194 lb (88 kg)  01/17/18 195 lb 3.2 oz (88.5 kg)  08/25/17 191 lb 12.8 oz (87 kg)     ASSESSMENT AND PLAN:  Pure hypercholesterolemia - Plan: EKG 12-Lead stay on her praluent,  Paperwork will be filled out by our office New order for lab work liver and lipids  Coronary artery disease involving native coronary artery of native heart without angina pectoris - Plan: EKG 12-Lead Previous CT coronary calcium score 190 On Praluent  Cardiac arrhythmia, unspecified cardiac arrhythmia type - Plan: EKG 12-Lead Long discussion concerning her arrhythmia Episodes of atrial tachycardia,  Unclear if this is exacerbated by back  pain Periodically takes propranolol, sometimes flecainide Suggested she consider a event monitor if symptoms get worse Discussed readings from her iPhone Sometimes with tachycardia, ectopy, is unclear Again we did offer event monitor  Essential hypertension Blood pressure is well controlled on today's visit. No changes made to the medications.   Total encounter time more than 25 minutes  Greater than 50% was spent in counseling and coordination of care with the patient   Disposition:   F/U  12 months   Orders Placed This Encounter  Procedures  . EKG 12-Lead  . EKG 12-Lead     Signed, Esmond Plants, M.D., Ph.D. 04/04/2018  [place], [place]

## 2018-04-04 ENCOUNTER — Ambulatory Visit (INDEPENDENT_AMBULATORY_CARE_PROVIDER_SITE_OTHER): Payer: Medicare Other | Admitting: Cardiovascular Disease

## 2018-04-04 ENCOUNTER — Encounter: Payer: Self-pay | Admitting: Cardiovascular Disease

## 2018-04-04 VITALS — BP 130/78 | HR 56 | Ht 66.0 in | Wt 194.0 lb

## 2018-04-04 DIAGNOSIS — I25118 Atherosclerotic heart disease of native coronary artery with other forms of angina pectoris: Secondary | ICD-10-CM

## 2018-04-04 DIAGNOSIS — I499 Cardiac arrhythmia, unspecified: Secondary | ICD-10-CM | POA: Diagnosis not present

## 2018-04-04 DIAGNOSIS — I10 Essential (primary) hypertension: Secondary | ICD-10-CM

## 2018-04-04 DIAGNOSIS — E782 Mixed hyperlipidemia: Secondary | ICD-10-CM

## 2018-04-04 DIAGNOSIS — I471 Supraventricular tachycardia: Secondary | ICD-10-CM | POA: Diagnosis not present

## 2018-04-04 NOTE — Patient Instructions (Addendum)
We will work on [employer] paperwork Medication Samples have been provided to the patient.  Drug name: Praluent       Strength: 75 mg        Qty: 1 box  LOT: [id number]  Exp.Date: 01/26/2019   Medication Instructions:  No changes  If you need a refill on your cardiac medications before your next appointment, please call your pharmacy.    Lab work: Liver and lipids today   If you have labs (blood work) drawn today and your tests are completely normal, you will receive your results only by: Marland Kitchen MyChart Message (if you have MyChart) OR . A paper copy in the mail If you have any lab test that is abnormal or we need to change your treatment, we will call you to review the results.   Testing/Procedures: No new testing needed   Follow-Up: At [facility], you and your health needs are our priority.  As part of our continuing mission to provide you with exceptional heart care, we have created designated Provider Care Teams.  These Care Teams include your primary Cardiologist (physician) and Advanced Practice Providers (APPs -  Physician Assistants and Nurse Practitioners) who all work together to provide you with the care you need, when you need it.  . You will need a follow up appointment in 12 months .   Please call our office 2 months in advance to schedule this appointment.    . Providers on your designated Care Team:   . Murray Hodgkins, NP . Christell Faith, PA-C . Marrianne Mood, PA-C  Any Other Special Instructions Will Be Listed Below (If Applicable).  For educational health videos Log in to : [email or web address] Or : [email or web address], password : [email or web address]

## 2018-04-05 ENCOUNTER — Ambulatory Visit: Payer: Medicare Other

## 2018-04-05 DIAGNOSIS — M6281 Muscle weakness (generalized): Secondary | ICD-10-CM

## 2018-04-05 DIAGNOSIS — M25512 Pain in left shoulder: Secondary | ICD-10-CM | POA: Diagnosis not present

## 2018-04-05 DIAGNOSIS — G8929 Other chronic pain: Secondary | ICD-10-CM | POA: Diagnosis not present

## 2018-04-05 DIAGNOSIS — M25552 Pain in left hip: Secondary | ICD-10-CM | POA: Diagnosis not present

## 2018-04-05 LAB — LIPID PANEL
CHOLESTEROL TOTAL: 198 mg/dL (ref 100–199)
Chol/HDL Ratio: 3.7 ratio (ref 0.0–4.4)
HDL: 54 mg/dL (ref >39)
LDL CALC: 94 mg/dL (ref 0–99)
Triglycerides: 248 mg/dL — ABNORMAL HIGH (ref 0–149)
VLDL Cholesterol Cal: 50 mg/dL — ABNORMAL HIGH (ref 5–40)

## 2018-04-05 LAB — HEPATIC FUNCTION PANEL
ALT: 11 IU/L (ref 0–32)
AST: 16 IU/L (ref 0–40)
Albumin: 4.4 g/dL (ref 3.5–4.8)
Alkaline Phosphatase: 72 IU/L (ref 39–117)
BILIRUBIN, DIRECT: 0.1 mg/dL (ref 0.00–0.40)
Bilirubin Total: 0.4 mg/dL (ref 0.0–1.2)
TOTAL PROTEIN: 7.3 g/dL (ref 6.0–8.5)

## 2018-04-05 NOTE — Therapy (Signed)
[facility] [address], [place], [address] Phone: [phone]   Fax:  [phone]  Physical Therapy Treatment  Patient Details  Name: Dawn Herring MRN: [id number] Date of Birth: 04/29/1945 Referring Provider (PT): Tommi Rumps, MD   Encounter Date: 04/05/2018  PT End of Session - 04/05/18 1119    Visit Number  14    Number of Visits  25    Date for PT Re-Evaluation  05/03/18    Authorization Type  4    Authorization Time Period  of 10 progress report    PT Start Time  1120    PT Stop Time  1205    PT Time Calculation (min)  45 min    Activity Tolerance  Patient tolerated treatment well    Behavior During Therapy  [facility] for tasks assessed/performed       Past Medical History:  Diagnosis Date  . Chest pain    a. Neg stress tests - 2005, 2008, 2009;  b. 08/2014 Cardiac CT: Ca score 193 (80th%'ile).  . Essential hypertension   . Hiatal hernia    a. 08/2014 CT chest: large hiatal hernia.  Marland Kitchen History of DVT (deep vein thrombosis)   . Hyperlipidemia    a. statin intolerant;  b. on zetia and repatha.  . Palpitations    a. 02/2015 Event Monitor: NSR w/ rare, short episodes of A Tach, rare short runs of SVT < 15 beats.    Past Surgical History:  Procedure Laterality Date  . CATARACT EXTRACTION, BILATERAL    . LAPAROSCOPIC APPENDECTOMY N/A 12/02/2015   Procedure: APPENDECTOMY LAPAROSCOPIC;  Surgeon: Clovis Riley, MD;  Location: [facility];  Service: General;  Laterality: N/A;  . ROOT CANAL  2013  . ROOT CANAL    . VAGINAL HYSTERECTOMY     abdominal hysterectomy    There were no vitals filed for this visit.  Subjective Assessment - 04/05/18 1121    Subjective  L shoulder is ok today. Has not done anything. Feels pretty good. Switched chairs yesterday which might have helped her shoulder. 0/10 L shoulder pain currently. Still feels pain when she reaches across but its gone with time.  3/10 L shoulder pain  at worst for the past 7 days but pt states that she has not been trying not to do anything to aggravate it.     Pertinent History  L shoulder and L hip pain. L shoulder bothers her more which keeps her from sleeping. Difficulty laying in her L side. Difficulty donning and doffing shirts and sweaters. Not sure how much of her pain is related to her cholesterol medicine. Has had reactions to her cholesterol medicines before.   L shoulder pain started about a little over a year ago which has gradually worsened. Also has a difficult time walking after sitting for a while.  L shoulder pain occured gradually. Only noticed L shoulder bothering her sleeping in the past few months.  Has not yet had imaging for her L shoulder and hip.  Pt is R hand dominant.  L hip pain began years ago which disappeared. L hip pain started bothering her consistently for the past 6 months particularly when pt tries to sleep.  Denies unexplained changes in weight.      Patient Stated Goals  Want to be able to keep moving.     Currently in Pain?  No/denies    Pain Score  0-No pain  Pain Onset  More than a month ago                               PT Education - 04/05/18 1749    Education Details  ther-ex, plan of care    Person(s) Educated  Patient    Methods  Explanation;Demonstration;Tactile cues;Verbal cues    Comprehension  Returned demonstration;Verbalized understanding      Objectives   [employer] Access Coe: [facility]  Denies personal history of CA or osteoporosis, no pacemakers or defibrillators. Denies latex band allergies  Manual therapy  Seated STM L infraspinatus    Decreased discomfort with reaching across   Therapeutic exercise  Seated manually resisted L shoulder eccentric flexion and manually resisted extension 10x2  Seated manually resisted L shoulder horizontal abduction at about 90 degrees flexion   10x5 seconds for 3 sets  Eccentric horizontal adduction at around  90 degrees flexion with concentric horizontal abduction to the return motion. 10x2  Decreased L shoulder pain with reaching across.   OMEGA rows   Plate 10 with 2 lbs dumbbell 5x3, then 10x  Seated manually resisted L scapular retraction targeting lower trap muscle  10x5 seconds for 3 sets    Add horizontal abduction yellow band next visit and give as part of her HEP if appropriate  Improved exercise technique, movement at target joints, use of target muscles after mod verbal, visual, tactile cues.    Pt demonstrates overall improved L shoulder pain as well as decreased difficulty donning and doffing shirts and sweaters. Decreased discomfort with reaching across with her L arm. Pt feels discomfort initially when performing the motion but discomfort now eases while in the position instead of lingering. Working her posterior lateral deltoid and decreasing infraspinatus muscle tension seem to help decrease anterior translation of her humeral head when she reaches across and improves comfort level. Pt also demonstrates improved L shoulder ER muscle strength, improved L glute strength, as well as ability to perform functional tasks since initial evaluation. Pt still demonstrates L shoulder and hip pain/discomfort, as well as difficulty performing functional tasks such as reaching across with her L arm and will benefit from continued skilled physical therapy services to address the aforementioned deficits.     PT Short Term Goals - 03/22/18 1712      PT SHORT TERM GOAL #1   Title  Patient will be independent with her HEP to decrease L shoulder and hip pain, improve strength and function.     Baseline  Pt has started her HEP (02/07/2018); Pt performing her HEP (03/22/2018)    Time  3    Period  Weeks    Status  Achieved    Target Date  03/01/18        PT Long Term Goals - 04/05/18 1211      PT LONG TERM GOAL #1   Title  Patient will have a decrease in L shoulder pain to 2/10 or less at  worst to promote ability to don and doff her shirts and sweaters, as well as sleep on her L side.     Baseline  5/10 L shoulder pain at most for the past 3 months (02/07/2018); 4/10 at most for the past 7 days (03/22/2018); 3/10 at most for the past 7 days (04/05/2018)    Time  4    Period  Weeks    Status  Partially Met    Target Date  05/03/18      PT LONG TERM GOAL #2   Title  Patient will have a decrease in L hip pain to 3/10 or less at worst to promote ability to perform standing tasks such as stair negotiation.     Baseline  7/10 L hip pain at most for the past 3 months (02/07/2018); 2/10 L hip at most for the past 7 days (03/22/2018)    Time  8    Period  Weeks    Status  Achieved    Target Date  04/05/18      PT LONG TERM GOAL #3   Title  Patient will improve L shoulder ER strength by at least 1/2 MMT grade to promote ability to raise her arm and use it to perform functional tasks more comfortably.     Baseline  L shoulder ER 4-/5 (02/07/2018); 4+/5 (03/22/2018)    Time  8    Period  Weeks    Status  Achieved    Target Date  04/05/18      PT LONG TERM GOAL #4   Title  Patient will improve L glute med and max strength by at least 1/2 MMT grade to promote ability to perform standing tasks such as stair negotiation more comfortably for her L hip.     Baseline  L hip extension 4-/5 (seated manually resisted), hip abduction 4/5 (seated manually resisted clamshell isometrics) 02/07/2018, L hip extension 4/5 (seated manually resisted), hip abduction 5/5 (seated manually resisted clamshell isometrics)     Time  8    Period  Weeks    Status  Achieved    Target Date  04/05/18      PT LONG TERM GOAL #5   Title  Patient will improve her L shoulder FOTO score by at least 10 points as a demonstration of improved function.     Baseline  L shoulder FOTO 55 (02/07/2018); 64 (03/22/2018)    Time  4    Period  Weeks    Status  Partially Met    Target Date  05/03/18      PT LONG TERM GOAL #6    Title  Patient will improve her Quick Dash score by at least 10% as a demonstration of improved function.     Baseline  29.5%  (02/07/2018); 18.2% (03/22/2018)    Time  8    Period  Weeks    Status  Achieved    Target Date  04/05/18            Plan - 04/05/18 1210    Clinical Impression Statement  Pt demonstrates overall improved L shoulder pain as well as decreased difficulty donning and doffing shirts and sweaters. Decreased discomfort with reaching across with her L arm. Pt feels discomfort initially when performing the motion but discomfort now eases while in the position instead of lingering. Working her posterior lateral deltoid and decreasing infraspinatus muscle tension seem to help decrease anterior translation of her humeral head when she reaches across and improves comfort level. Pt also demonstrates improved L shoulder ER muscle strength, improved L glute strength, as well as ability to perform functional tasks since initial evaluation. Pt still demonstrates L shoulder and hip pain/discomfort, as well as difficulty performing functional tasks such as reaching across with her L arm and will benefit from continued skilled physical therapy services to address the aforementioned deficits.     History and Personal Factors relevant to plan of care:  Chronicity of condition, L  shoulder pain    Clinical Presentation  Stable    Clinical Presentation due to:  Patient making progress with PT towards goals    Rehab Potential  Fair    Clinical Impairments Affecting Rehab Potential  (-) Chronicity of condition, weakness, multiple areas of pain, age; (+) motivated    PT Frequency  2x / week    PT Duration  8 weeks    PT Treatment/Interventions  Therapeutic activities;Therapeutic exercise;Neuromuscular re-education;Patient/family education;Manual techniques;Dry needling;Aquatic Therapy;Electrical Stimulation;Iontophoresis 62m/ml Dexamethasone;Ultrasound    PT Next Visit Plan  scapular, glute  med, max, trunk strengthening, gentle infraspinatus muscle strengthening, manual techniques, modalities PRN    Consulted and Agree with Plan of Care  Patient       Patient will benefit from skilled therapeutic intervention in order to improve the following deficits and impairments:  Pain, Postural dysfunction, Improper body mechanics, Impaired UE functional use, Decreased strength, Decreased range of motion, Abnormal gait  Visit Diagnosis: Chronic left shoulder pain - Plan: PT plan of care cert/re-cert  Muscle weakness (generalized) - Plan: PT plan of care cert/re-cert  Pain in left hip - Plan: PT plan of care cert/re-cert     Problem List Patient Active Problem List   Diagnosis Date Noted  . DOE (dyspnea on exertion) 01/17/2018  . Proteinuria 01/17/2018  . Left hip pain 01/17/2018  . Rotator cuff impingement syndrome of left shoulder 01/17/2018  . Myalgia 07/18/2017  . Neuropathy 01/13/2017  . Arrhythmia   . Palpitations   . Essential hypertension   . Right foot pain 03/17/2015  . Non-alcoholic fatty liver disease 03/03/2015  . Gallbladder attack 01/28/2015  . Thyroid nodule 01/26/2015  . CAD (coronary artery disease) 01/26/2015  . Tachycardia 07/30/2014  . Foot swelling 10/30/2012  . Obesity 09/05/2010  . Hyperlipidemia 04/14/2010    MJoneen BoersPT, DPT   04/05/2018, 6:01 PM  [facility] [address] [place] [address]Phone: [phone]  Fax:  [phone] Name: Dawn BONFIGLIOMRN: [id number] of Birth: 1April 05, 1947

## 2018-04-09 ENCOUNTER — Ambulatory Visit: Payer: Medicare Other

## 2018-04-09 DIAGNOSIS — M6281 Muscle weakness (generalized): Secondary | ICD-10-CM

## 2018-04-09 DIAGNOSIS — G8929 Other chronic pain: Secondary | ICD-10-CM | POA: Diagnosis not present

## 2018-04-09 DIAGNOSIS — M25512 Pain in left shoulder: Principal | ICD-10-CM

## 2018-04-09 DIAGNOSIS — M25552 Pain in left hip: Secondary | ICD-10-CM | POA: Diagnosis not present

## 2018-04-09 NOTE — Therapy (Signed)
[facility] [address], [place], [address] Phone: [phone]   Fax:  [phone]  Physical Therapy Treatment  Patient Details  Name: Dawn Herring MRN: [id number] Date of Birth: Jan 28, 1946 Referring Provider (PT): Tommi Rumps, MD   Encounter Date: 04/09/2018  PT End of Session - 04/09/18 1258    Visit Number  15    Number of Visits  25    Date for PT Re-Evaluation  05/03/18    Authorization Type  5    Authorization Time Period  of 10 progress report    PT Start Time  1259    PT Stop Time  1343    PT Time Calculation (min)  44 min    Activity Tolerance  Patient tolerated treatment well    Behavior During Therapy  [facility] for tasks assessed/performed       Past Medical History:  Diagnosis Date  . Chest pain    a. Neg stress tests - 2005, 2008, 2009;  b. 08/2014 Cardiac CT: Ca score 193 (80th%'ile).  . Essential hypertension   . Hiatal hernia    a. 08/2014 CT chest: large hiatal hernia.  Marland Kitchen History of DVT (deep vein thrombosis)   . Hyperlipidemia    a. statin intolerant;  b. on zetia and repatha.  . Palpitations    a. 02/2015 Event Monitor: NSR w/ rare, short episodes of A Tach, rare short runs of SVT < 15 beats.    Past Surgical History:  Procedure Laterality Date  . CATARACT EXTRACTION, BILATERAL    . LAPAROSCOPIC APPENDECTOMY N/A 12/02/2015   Procedure: APPENDECTOMY LAPAROSCOPIC;  Surgeon: Clovis Riley, MD;  Location: [place];  Service: General;  Laterality: N/A;  . ROOT CANAL  2013  . ROOT CANAL    . VAGINAL HYSTERECTOMY     abdominal hysterectomy    There were no vitals filed for this visit.  Subjective Assessment - 04/09/18 1302    Subjective  Pt states that her praluent medication is being increased to 150 mg. Cholesterol medications causes liver and muscle discomfort.  L shoulder has been ok.  Still has L shoulder discomfort when reaching across.  0/10 at rest, 5/10 when reaching  across. Was good after last session.     Pertinent History  L shoulder and L hip pain. L shoulder bothers her more which keeps her from sleeping. Difficulty laying in her L side. Difficulty donning and doffing shirts and sweaters. Not sure how much of her pain is related to her cholesterol medicine. Has had reactions to her cholesterol medicines before.   L shoulder pain started about a little over a year ago which has gradually worsened. Also has a difficult time walking after sitting for a while.  L shoulder pain occured gradually. Only noticed L shoulder bothering her sleeping in the past few months.  Has not yet had imaging for her L shoulder and hip.  Pt is R hand dominant.  L hip pain began years ago which disappeared. L hip pain started bothering her consistently for the past 6 months particularly when pt tries to sleep.  Denies unexplained changes in weight.      Patient Stated Goals  Want to be able to keep moving.     Currently in Pain?  Yes    Pain Score  5    when reaching across.    Pain Onset  More than a month ago  PT Education - 04/09/18 1318    Education Details  ther-ex, HEP    Person(s) Educated  Patient    Methods  Explanation;Demonstration;Tactile cues;Verbal cues;Handout    Comprehension  Returned demonstration;Verbalized understanding        Objectives   Medbridge Access Coe: [facility]  Denies personal history of CA or osteoporosis, no pacemakers or defibrillators. Denies latex band allergies  Manual therapy Seated STM L infraspinatus               Decreased discomfort with reaching across   Therapeutic exercise   B horizontal abduction yellow band 10x2 L shoulder horizontal abduction from close to end range horizontal adduction 10x2  No change with pain when reaching across  Seated manually resisted L shoulder horizontal abduction at about 90 degrees flexion with eccentric horizontal adduction.  Posterior lateral translation of humeral head palpated             10x 3 sets  Decreased pain with reaching across  L shoulder isometric IR at about 90 degrees flexion and about 90 degrees IR 10x5 seconds for 3 sets   To promote posterior glide of L humeral head  Seated with L arm propped in about 90 degrees flexion  L shoulder IR red band 10x2   Improved exercise technique, movement at target joints, use of target muscles after mod verbal, visual, tactile cues.   Decreased L shoulder pain with reaching across following treatment to decrease tension to L infraspinatus muscle as well as activation of L shoulder IR muscles in the horizontally adducted position as well as activation of posterior lateral shoulder muscles to promote posterior lateral glide of L humeral head to decrease impingement when reaching across. No pain with reaching across after session. Pt will benefit from continued skilled PT services to decrease pain, improve strength and function.         PT Short Term Goals - 03/22/18 1712      PT SHORT TERM GOAL #1   Title  Patient will be independent with her HEP to decrease L shoulder and hip pain, improve strength and function.     Baseline  Pt has started her HEP (02/07/2018); Pt performing her HEP (03/22/2018)    Time  3    Period  Weeks    Status  Achieved    Target Date  03/01/18        PT Long Term Goals - 04/05/18 1211      PT LONG TERM GOAL #1   Title  Patient will have a decrease in L shoulder pain to 2/10 or less at worst to promote ability to don and doff her shirts and sweaters, as well as sleep on her L side.     Baseline  5/10 L shoulder pain at most for the past 3 months (02/07/2018); 4/10 at most for the past 7 days (03/22/2018); 3/10 at most for the past 7 days (04/05/2018)    Time  4    Period  Weeks    Status  Partially Met    Target Date  05/03/18      PT LONG TERM GOAL #2   Title  Patient will have a decrease in L hip pain to 3/10 or less  at worst to promote ability to perform standing tasks such as stair negotiation.     Baseline  7/10 L hip pain at most for the past 3 months (02/07/2018); 2/10 L hip at most for the past 7 days (03/22/2018)  Time  8    Period  Weeks    Status  Achieved    Target Date  04/05/18      PT LONG TERM GOAL #3   Title  Patient will improve L shoulder ER strength by at least 1/2 MMT grade to promote ability to raise her arm and use it to perform functional tasks more comfortably.     Baseline  L shoulder ER 4-/5 (02/07/2018); 4+/5 (03/22/2018)    Time  8    Period  Weeks    Status  Achieved    Target Date  04/05/18      PT LONG TERM GOAL #4   Title  Patient will improve L glute med and max strength by at least 1/2 MMT grade to promote ability to perform standing tasks such as stair negotiation more comfortably for her L hip.     Baseline  L hip extension 4-/5 (seated manually resisted), hip abduction 4/5 (seated manually resisted clamshell isometrics) 02/07/2018, L hip extension 4/5 (seated manually resisted), hip abduction 5/5 (seated manually resisted clamshell isometrics)     Time  8    Period  Weeks    Status  Achieved    Target Date  04/05/18      PT LONG TERM GOAL #5   Title  Patient will improve her L shoulder FOTO score by at least 10 points as a demonstration of improved function.     Baseline  L shoulder FOTO 55 (02/07/2018); 64 (03/22/2018)    Time  4    Period  Weeks    Status  Partially Met    Target Date  05/03/18      PT LONG TERM GOAL #6   Title  Patient will improve her Quick Dash score by at least 10% as a demonstration of improved function.     Baseline  29.5%  (02/07/2018); 18.2% (03/22/2018)    Time  8    Period  Weeks    Status  Achieved    Target Date  04/05/18            Plan - 04/09/18 1258    Clinical Impression Statement  Decreased L shoulder pain with reaching across following treatment to decrease tension to L infraspinatus muscle as well as  activation of L shoulder IR muscles in the horizontally adducted position as well as activation of posterior lateral shoulder muscles to promote posterior lateral glide of L humeral head to decrease impingement when reaching across. No pain with reaching across after session. Pt will benefit from continued skilled PT services to decrease pain, improve strength and function.     Rehab Potential  Fair    Clinical Impairments Affecting Rehab Potential  (-) Chronicity of condition, weakness, multiple areas of pain, age; (+) motivated    PT Frequency  2x / week    PT Duration  8 weeks    PT Treatment/Interventions  Therapeutic activities;Therapeutic exercise;Neuromuscular re-education;Patient/family education;Manual techniques;Dry needling;Aquatic Therapy;Electrical Stimulation;Iontophoresis 32m/ml Dexamethasone;Ultrasound    PT Next Visit Plan  scapular, glute med, max, trunk strengthening, gentle infraspinatus muscle strengthening, manual techniques, modalities PRN    Consulted and Agree with Plan of Care  Patient       Patient will benefit from skilled therapeutic intervention in order to improve the following deficits and impairments:  Pain, Postural dysfunction, Improper body mechanics, Impaired UE functional use, Decreased strength, Decreased range of motion, Abnormal gait  Visit Diagnosis: Chronic left shoulder pain  Muscle weakness (generalized)  Problem List Patient Active Problem List   Diagnosis Date Noted  . DOE (dyspnea on exertion) 01/17/2018  . Proteinuria 01/17/2018  . Left hip pain 01/17/2018  . Rotator cuff impingement syndrome of left shoulder 01/17/2018  . Myalgia 07/18/2017  . Neuropathy 01/13/2017  . Arrhythmia   . Palpitations   . Essential hypertension   . Right foot pain 03/17/2015  . Non-alcoholic fatty liver disease 03/03/2015  . Gallbladder attack 01/28/2015  . Thyroid nodule 01/26/2015  . CAD (coronary artery disease) 01/26/2015  . Tachycardia  07/30/2014  . Foot swelling 10/30/2012  . Obesity 09/05/2010  . Hyperlipidemia 04/14/2010    Joneen Boers PT, DPT   04/09/2018, 4:30 PM  [facility] [address] [place], [address] Phone: [phone]   Fax:  [phone]  Name: VAANI MORREN MRN: [id number] Date of Birth: Apr 05, 1945

## 2018-04-09 NOTE — Patient Instructions (Signed)
Seated with L arm propped in about 90 degrees flexion  L shoulder IR red band 10x3  reviewed and given as part of her HEP. Pt demonstrated and verbalized understanding. Handout provided.

## 2018-04-11 ENCOUNTER — Telehealth: Payer: Self-pay | Admitting: *Deleted

## 2018-04-11 MED ORDER — ALIROCUMAB 150 MG/ML ~~LOC~~ SOAJ: 150.0000 mg | SUBCUTANEOUS | 11 refills | Status: DC

## 2018-04-11 NOTE — Telephone Encounter (Signed)
Updated prescription sent over to [facility] program for patient.

## 2018-04-11 NOTE — Telephone Encounter (Signed)
Spoke with patient and reviewed results and recommendations. Discussed increase in her medication and that I would send that to last known pharmacy. She reports that it comes from the [facility] program so will print script and fax to assistance program. Instructed her to please give me a call back if she has any problems with this increase. She verbalized understanding and had no further questions at this time.

## 2018-04-11 NOTE — Telephone Encounter (Signed)
-----   Message from Minna Merritts, MD sent at 04/08/2018  5:55 PM EST ----- Total cholesterol around 200 LDL increased from 70 now up to 94 Would work on the diet Increase Praluent up to 150

## 2018-04-12 ENCOUNTER — Ambulatory Visit: Payer: Medicare Other

## 2018-04-12 DIAGNOSIS — M6281 Muscle weakness (generalized): Secondary | ICD-10-CM | POA: Diagnosis not present

## 2018-04-12 DIAGNOSIS — G8929 Other chronic pain: Secondary | ICD-10-CM

## 2018-04-12 DIAGNOSIS — M25512 Pain in left shoulder: Secondary | ICD-10-CM | POA: Diagnosis not present

## 2018-04-12 DIAGNOSIS — M25552 Pain in left hip: Secondary | ICD-10-CM | POA: Diagnosis not present

## 2018-04-12 NOTE — Patient Instructions (Signed)
   Sitting with your left arm propped on a table comfortably at about 90 degrees flexion and forearm resting on the table    Press your left arm and forearm comfortably on the table for 5 seconds   Repeat 10 times,    Perform 3 sets daily.

## 2018-04-12 NOTE — Therapy (Signed)
[facility] [address] [place], [address] Phone: [phone]   Fax:  [phone]  Physical Therapy Treatment  Patient Details  Name: Dawn Herring MRN: [id number] Date of Birth: 27-Aug-1945 Referring Provider (PT): Tommi Rumps, MD   Encounter Date: 04/12/2018  PT End of Session - 04/12/18 1112    Visit Number  16    Number of Visits  25    Date for PT Re-Evaluation  05/03/18    Authorization Type  6    Authorization Time Period  of 10 progress report    PT Start Time  1112    PT Stop Time  1153    PT Time Calculation (min)  41 min    Activity Tolerance  Patient tolerated treatment well    Behavior During Therapy  [facility] for tasks assessed/performed       Past Medical History:  Diagnosis Date  . Chest pain    a. Neg stress tests - 2005, 2008, 2009;  b. 08/2014 Cardiac CT: Ca score 193 (80th%'ile).  . Essential hypertension   . Hiatal hernia    a. 08/2014 CT chest: large hiatal hernia.  Marland Kitchen History of DVT (deep vein thrombosis)   . Hyperlipidemia    a. statin intolerant;  b. on zetia and repatha.  . Palpitations    a. 02/2015 Event Monitor: NSR w/ rare, short episodes of A Tach, rare short runs of SVT < 15 beats.    Past Surgical History:  Procedure Laterality Date  . CATARACT EXTRACTION, BILATERAL    . LAPAROSCOPIC APPENDECTOMY N/A 12/02/2015   Procedure: APPENDECTOMY LAPAROSCOPIC;  Surgeon: Clovis Riley, MD;  Location: [place];  Service: General;  Laterality: N/A;  . ROOT CANAL  2013  . ROOT CANAL    . VAGINAL HYSTERECTOMY     abdominal hysterectomy    There were no vitals filed for this visit.  Subjective Assessment - 04/12/18 1113    Subjective  Has not had L shoulder problems other than reaching across.  L hip is ok, not bothering her for the past week.  Still has difficulty laying on L side while sleeping. Lies on R side or on her back.     Pertinent History  L shoulder and L hip pain.  L shoulder bothers her more which keeps her from sleeping. Difficulty laying in her L side. Difficulty donning and doffing shirts and sweaters. Not sure how much of her pain is related to her cholesterol medicine. Has had reactions to her cholesterol medicines before.   L shoulder pain started about a little over a year ago which has gradually worsened. Also has a difficult time walking after sitting for a while.  L shoulder pain occured gradually. Only noticed L shoulder bothering her sleeping in the past few months.  Has not yet had imaging for her L shoulder and hip.  Pt is R hand dominant.  L hip pain began years ago which disappeared. L hip pain started bothering her consistently for the past 6 months particularly when pt tries to sleep.  Denies unexplained changes in weight.      Patient Stated Goals  Want to be able to keep moving.     Currently in Pain?  Yes    Pain Score  4    when reaching across with arm, no pain at rest.    Pain Onset  More than a month ago  PT Education - 04/12/18 1132    Education Details  ther-ex, HEP    Person(s) Educated  Patient    Methods  Explanation;Demonstration;Tactile cues;Verbal cues;Handout    Comprehension  Returned demonstration;Verbalized understanding       Objectives   Medbridge Access Coe: [facility]  Denies personal history of CA or osteoporosis, no pacemakers or defibrillators. Denies latex band allergies  Therapeutic exercise   L shoulder isometric IR at about 90 degrees flexion and about 90 degrees IR 10x5 seconds for 3 sets              To promote posterior glide of L humeral head  Seated L shoulder extension isometrics with L arm in 90 degrees flexion and slight adduction 10x3 with 5 second holds  Sitting with L arm propped on table at about 90 degrees flexion and forearm on table   L shoulder extension isometrics 10x5 seconds for 3 sets  Seated manually resisted L  shoulder horizontal abduction at about 90 degrees flexion with eccentric horizontal adduction. Posterior lateral translation of humeral head palpated 10x3 sets  Seated manually resisted L scapular retraction targeting the lower trap muscles   10x5 seconds for 3 sets  L S/L Sleeper stretch with concentric and eccentric activation of shoulder IR muscles. No pain throughout exercise.    Improved exercise technique, movement at target joints, use of target muscles after min to mod verbal, visual, tactile cues.   No pain with reaching across with her L arm after session.            PT Short Term Goals - 03/22/18 1712      PT SHORT TERM GOAL #1   Title  Patient will be independent with her HEP to decrease L shoulder and hip pain, improve strength and function.     Baseline  Pt has started her HEP (02/07/2018); Pt performing her HEP (03/22/2018)    Time  3    Period  Weeks    Status  Achieved    Target Date  03/01/18        PT Long Term Goals - 04/05/18 1211      PT LONG TERM GOAL #1   Title  Patient will have a decrease in L shoulder pain to 2/10 or less at worst to promote ability to don and doff her shirts and sweaters, as well as sleep on her L side.     Baseline  5/10 L shoulder pain at most for the past 3 months (02/07/2018); 4/10 at most for the past 7 days (03/22/2018); 3/10 at most for the past 7 days (04/05/2018)    Time  4    Period  Weeks    Status  Partially Met    Target Date  05/03/18      PT LONG TERM GOAL #2   Title  Patient will have a decrease in L hip pain to 3/10 or less at worst to promote ability to perform standing tasks such as stair negotiation.     Baseline  7/10 L hip pain at most for the past 3 months (02/07/2018); 2/10 L hip at most for the past 7 days (03/22/2018)    Time  8    Period  Weeks    Status  Achieved    Target Date  04/05/18      PT LONG TERM GOAL #3   Title  Patient will improve L shoulder ER strength by at least 1/2  MMT grade to promote ability to  raise her arm and use it to perform functional tasks more comfortably.     Baseline  L shoulder ER 4-/5 (02/07/2018); 4+/5 (03/22/2018)    Time  8    Period  Weeks    Status  Achieved    Target Date  04/05/18      PT LONG TERM GOAL #4   Title  Patient will improve L glute med and max strength by at least 1/2 MMT grade to promote ability to perform standing tasks such as stair negotiation more comfortably for her L hip.     Baseline  L hip extension 4-/5 (seated manually resisted), hip abduction 4/5 (seated manually resisted clamshell isometrics) 02/07/2018, L hip extension 4/5 (seated manually resisted), hip abduction 5/5 (seated manually resisted clamshell isometrics)     Time  8    Period  Weeks    Status  Achieved    Target Date  04/05/18      PT LONG TERM GOAL #5   Title  Patient will improve her L shoulder FOTO score by at least 10 points as a demonstration of improved function.     Baseline  L shoulder FOTO 55 (02/07/2018); 64 (03/22/2018)    Time  4    Period  Weeks    Status  Partially Met    Target Date  05/03/18      PT LONG TERM GOAL #6   Title  Patient will improve her Quick Dash score by at least 10% as a demonstration of improved function.     Baseline  29.5%  (02/07/2018); 18.2% (03/22/2018)    Time  8    Period  Weeks    Status  Achieved    Target Date  04/05/18            Plan - 04/12/18 1111    Clinical Impression Statement  Decreased L anterior shoulder pain when reaching across following exercises that promote activation of her Internal rotators and posterior shoulder muscles to promote posterior lateral translation of her humeral head to decrease impingement. Pt will benefit from continued skilled physical therapy services to decrease pain, improve strength, and ability to reach across more comfortably.     Rehab Potential  Fair    Clinical Impairments Affecting Rehab Potential  (-) Chronicity of condition, weakness,  multiple areas of pain, age; (+) motivated    PT Frequency  2x / week    PT Duration  8 weeks    PT Treatment/Interventions  Therapeutic activities;Therapeutic exercise;Neuromuscular re-education;Patient/family education;Manual techniques;Dry needling;Aquatic Therapy;Electrical Stimulation;Iontophoresis 29m/ml Dexamethasone;Ultrasound    PT Next Visit Plan  scapular, glute med, max, trunk strengthening, gentle infraspinatus muscle strengthening, manual techniques, modalities PRN    Consulted and Agree with Plan of Care  Patient       Patient will benefit from skilled therapeutic intervention in order to improve the following deficits and impairments:  Pain, Postural dysfunction, Improper body mechanics, Impaired UE functional use, Decreased strength, Decreased range of motion, Abnormal gait  Visit Diagnosis: Chronic left shoulder pain  Muscle weakness (generalized)     Problem List Patient Active Problem List   Diagnosis Date Noted  . DOE (dyspnea on exertion) 01/17/2018  . Proteinuria 01/17/2018  . Left hip pain 01/17/2018  . Rotator cuff impingement syndrome of left shoulder 01/17/2018  . Myalgia 07/18/2017  . Neuropathy 01/13/2017  . Arrhythmia   . Palpitations   . Essential hypertension   . Right foot pain 03/17/2015  . Non-alcoholic fatty liver disease 03/03/2015  .  Gallbladder attack 01/28/2015  . Thyroid nodule 01/26/2015  . CAD (coronary artery disease) 01/26/2015  . Tachycardia 07/30/2014  . Foot swelling 10/30/2012  . Obesity 09/05/2010  . Hyperlipidemia 04/14/2010    Joneen Boers PT, DPT   04/12/2018, 12:02 PM  [facility] [address] [place], [address] Phone: [phone]   Fax:  [phone]  Name: Dawn Herring MRN: [id number] Date of Birth: December 18, 1945

## 2018-04-16 ENCOUNTER — Ambulatory Visit: Payer: Medicare Other

## 2018-04-16 DIAGNOSIS — M25512 Pain in left shoulder: Secondary | ICD-10-CM | POA: Diagnosis not present

## 2018-04-16 DIAGNOSIS — M6281 Muscle weakness (generalized): Secondary | ICD-10-CM | POA: Diagnosis not present

## 2018-04-16 DIAGNOSIS — M25552 Pain in left hip: Secondary | ICD-10-CM

## 2018-04-16 DIAGNOSIS — G8929 Other chronic pain: Secondary | ICD-10-CM | POA: Diagnosis not present

## 2018-04-16 NOTE — Therapy (Signed)
[facility] [address], [place], [address] Phone: [phone]   Fax:  [phone]  Physical Therapy Treatment  Patient Details  Name: Dawn Herring MRN: [id number] Date of Birth: 15-May-1945 Referring Provider (PT): Tommi Rumps, MD   Encounter Date: 04/16/2018  PT End of Session - 04/16/18 1300    Visit Number  17    Number of Visits  25    Date for PT Re-Evaluation  05/03/18    Authorization Type  7    Authorization Time Period  of 10 progress report    PT Start Time  1300    PT Stop Time  1341    PT Time Calculation (min)  41 min    Activity Tolerance  Patient tolerated treatment well    Behavior During Therapy  [facility] for tasks assessed/performed       Past Medical History:  Diagnosis Date  . Chest pain    a. Neg stress tests - 2005, 2008, 2009;  b. 08/2014 Cardiac CT: Ca score 193 (80th%'ile).  . Essential hypertension   . Hiatal hernia    a. 08/2014 CT chest: large hiatal hernia.  Marland Kitchen History of DVT (deep vein thrombosis)   . Hyperlipidemia    a. statin intolerant;  b. on zetia and repatha.  . Palpitations    a. 02/2015 Event Monitor: NSR w/ rare, short episodes of A Tach, rare short runs of SVT < 15 beats.    Past Surgical History:  Procedure Laterality Date  . CATARACT EXTRACTION, BILATERAL    . LAPAROSCOPIC APPENDECTOMY N/A 12/02/2015   Procedure: APPENDECTOMY LAPAROSCOPIC;  Surgeon: Clovis Riley, MD;  Location: [facility];  Service: General;  Laterality: N/A;  . ROOT CANAL  2013  . ROOT CANAL    . VAGINAL HYSTERECTOMY     abdominal hysterectomy    There were no vitals filed for this visit.  Subjective Assessment - 04/16/18 1302    Subjective  L shoulder is ok.  A little better reaching across probably. Has not noticed her L shoulder as much when dressing as she used to.  L shoulder did not bother her when laying on her L side.  Has not had the numbness in her L fingers as much.       Pertinent History  L shoulder and L hip pain. L shoulder bothers her more which keeps her from sleeping. Difficulty laying in her L side. Difficulty donning and doffing shirts and sweaters. Not sure how much of her pain is related to her cholesterol medicine. Has had reactions to her cholesterol medicines before.   L shoulder pain started about a little over a year ago which has gradually worsened. Also has a difficult time walking after sitting for a while.  L shoulder pain occured gradually. Only noticed L shoulder bothering her sleeping in the past few months.  Has not yet had imaging for her L shoulder and hip.  Pt is R hand dominant.  L hip pain began years ago which disappeared. L hip pain started bothering her consistently for the past 6 months particularly when pt tries to sleep.  Denies unexplained changes in weight.      Patient Stated Goals  Want to be able to keep moving.     Currently in Pain?  Yes    Pain Score  2    when reaching across   Pain Onset  More than a month ago  PT Education - 04/16/18 1332    Education Details  ther-ex    Person(s) Educated  Patient    Methods  Explanation;Demonstration;Tactile cues;Verbal cues    Comprehension  Returned demonstration;Verbalized understanding      Objectives   Medbridge Access Coe: [facility]  Denies personal history of CA or osteoporosis, no pacemakers or defibrillators. Denies latex band allergies  Therapeutic exercise   Sitting with L arm propped on table at about 90 degrees flexion and forearm on table              L shoulder extension isometrics 10x5 seconds for 2 sets  L shoulder isometric IR at about 90 degrees flexion and about 90 degrees IR 10x5 seconds for 3 sets  To promote posterior glide of L humeral head  Seated manually resisted L shoulder horizontal abduction at about 90 degrees flexionwith eccentric horizontal adduction.   10x3 sets   Seated manually resisted L scapular retraction targeting the lower trap muscles              10x5 seconds for 3 sets  Seated L shoulder extension isometrics with L arm in 90 degrees flexion and slight adduction 10x3 with 5 second holds  L S/L Sleeper stretch with concentric and eccentric activation of shoulder IR muscles. 10x2  Supine pectoralis stretch with arms in about 70 degrees abduction 30 seconds, then 15 seconds x 3 bilaterally to promote scapular retraction.   TRX scapular retractions 10x2     Improved exercise technique, movement at target joints, use of target muscles after min to mod verbal, visual, tactile cues.   Continued working on improving shoulder IR and extension strength to promote posterior glide of humeral head when reaching across. Pt gradually decreasing starting pain levels with reaching across with her L arm. Pt will benefit from continued skilled phyisical therapy services to decrease pain, improve strength and function.       PT Short Term Goals - 03/22/18 1712      PT SHORT TERM GOAL #1   Title  Patient will be independent with her HEP to decrease L shoulder and hip pain, improve strength and function.     Baseline  Pt has started her HEP (02/07/2018); Pt performing her HEP (03/22/2018)    Time  3    Period  Weeks    Status  Achieved    Target Date  03/01/18        PT Long Term Goals - 04/05/18 1211      PT LONG TERM GOAL #1   Title  Patient will have a decrease in L shoulder pain to 2/10 or less at worst to promote ability to don and doff her shirts and sweaters, as well as sleep on her L side.     Baseline  5/10 L shoulder pain at most for the past 3 months (02/07/2018); 4/10 at most for the past 7 days (03/22/2018); 3/10 at most for the past 7 days (04/05/2018)    Time  4    Period  Weeks    Status  Partially Met    Target Date  05/03/18      PT LONG TERM GOAL #2   Title  Patient will have a decrease in L  hip pain to 3/10 or less at worst to promote ability to perform standing tasks such as stair negotiation.     Baseline  7/10 L hip pain at most for the past 3 months (02/07/2018); 2/10 L hip at most for  the past 7 days (03/22/2018)    Time  8    Period  Weeks    Status  Achieved    Target Date  04/05/18      PT LONG TERM GOAL #3   Title  Patient will improve L shoulder ER strength by at least 1/2 MMT grade to promote ability to raise her arm and use it to perform functional tasks more comfortably.     Baseline  L shoulder ER 4-/5 (02/07/2018); 4+/5 (03/22/2018)    Time  8    Period  Weeks    Status  Achieved    Target Date  04/05/18      PT LONG TERM GOAL #4   Title  Patient will improve L glute med and max strength by at least 1/2 MMT grade to promote ability to perform standing tasks such as stair negotiation more comfortably for her L hip.     Baseline  L hip extension 4-/5 (seated manually resisted), hip abduction 4/5 (seated manually resisted clamshell isometrics) 02/07/2018, L hip extension 4/5 (seated manually resisted), hip abduction 5/5 (seated manually resisted clamshell isometrics)     Time  8    Period  Weeks    Status  Achieved    Target Date  04/05/18      PT LONG TERM GOAL #5   Title  Patient will improve her L shoulder FOTO score by at least 10 points as a demonstration of improved function.     Baseline  L shoulder FOTO 55 (02/07/2018); 64 (03/22/2018)    Time  4    Period  Weeks    Status  Partially Met    Target Date  05/03/18      PT LONG TERM GOAL #6   Title  Patient will improve her Quick Dash score by at least 10% as a demonstration of improved function.     Baseline  29.5%  (02/07/2018); 18.2% (03/22/2018)    Time  8    Period  Weeks    Status  Achieved    Target Date  04/05/18            Plan - 04/16/18 1259    Clinical Impression Statement  Continued working on improving shoulder IR and extension strength to promote posterior glide of humeral  head when reaching across. Pt gradually decreasing starting pain levels with reaching across with her L arm. Pt will benefit from continued skilled phyisical therapy services to decrease pain, improve strength and function.     Rehab Potential  Fair    Clinical Impairments Affecting Rehab Potential  (-) Chronicity of condition, weakness, multiple areas of pain, age; (+) motivated    PT Frequency  2x / week    PT Duration  8 weeks    PT Treatment/Interventions  Therapeutic activities;Therapeutic exercise;Neuromuscular re-education;Patient/family education;Manual techniques;Dry needling;Aquatic Therapy;Electrical Stimulation;Iontophoresis 29m/ml Dexamethasone;Ultrasound    PT Next Visit Plan  scapular, glute med, max, trunk strengthening, gentle infraspinatus muscle strengthening, manual techniques, modalities PRN    Consulted and Agree with Plan of Care  Patient       Patient will benefit from skilled therapeutic intervention in order to improve the following deficits and impairments:  Pain, Postural dysfunction, Improper body mechanics, Impaired UE functional use, Decreased strength, Decreased range of motion, Abnormal gait  Visit Diagnosis: Chronic left shoulder pain  Muscle weakness (generalized)  Pain in left hip     Problem List Patient Active Problem List   Diagnosis Date Noted  .  DOE (dyspnea on exertion) 01/17/2018  . Proteinuria 01/17/2018  . Left hip pain 01/17/2018  . Rotator cuff impingement syndrome of left shoulder 01/17/2018  . Myalgia 07/18/2017  . Neuropathy 01/13/2017  . Arrhythmia   . Palpitations   . Essential hypertension   . Right foot pain 03/17/2015  . Non-alcoholic fatty liver disease 03/03/2015  . Gallbladder attack 01/28/2015  . Thyroid nodule 01/26/2015  . CAD (coronary artery disease) 01/26/2015  . Tachycardia 07/30/2014  . Foot swelling 10/30/2012  . Obesity 09/05/2010  . Hyperlipidemia 04/14/2010    Joneen Boers PT, DPT   04/16/2018, 7:31  PM  [facility] [address], [place], [address] Phone: [phone]   Fax:  [phone]  Name: Dawn Herring MRN: [id number] Date of Birth: [birth year]

## 2018-04-19 ENCOUNTER — Ambulatory Visit: Payer: Medicare Other

## 2018-04-19 DIAGNOSIS — M6281 Muscle weakness (generalized): Secondary | ICD-10-CM | POA: Diagnosis not present

## 2018-04-19 DIAGNOSIS — G8929 Other chronic pain: Secondary | ICD-10-CM | POA: Diagnosis not present

## 2018-04-19 DIAGNOSIS — M25552 Pain in left hip: Secondary | ICD-10-CM | POA: Diagnosis not present

## 2018-04-19 DIAGNOSIS — M25512 Pain in left shoulder: Principal | ICD-10-CM

## 2018-04-19 NOTE — Patient Instructions (Signed)
Medbridge Access Coe: [facility]  Shoulder Horizontal Abduction with Anchored Resistance  Red band 10x2 L UE

## 2018-04-19 NOTE — Therapy (Signed)
[facility] [address], [place], [address] Phone: [phone]   Fax:  [phone]  Physical Therapy Treatment  Patient Details  Name: Dawn Herring MRN: [id number] Date of Birth: [birth year] Referring Provider (PT): Tommi Rumps, MD   Encounter Date: 04/19/2018  PT End of Session - 04/19/18 1118    Visit Number  18    Number of Visits  25    Date for PT Re-Evaluation  05/03/18    Authorization Type  8    Authorization Time Period  of 10 progress report    PT Start Time  1118    PT Stop Time  1200    PT Time Calculation (min)  42 min    Activity Tolerance  Patient tolerated treatment well    Behavior During Therapy  [facility] for tasks assessed/performed       Past Medical History:  Diagnosis Date  . Chest pain    a. Neg stress tests - 2005, 2008, 2009;  b. 08/2014 Cardiac CT: Ca score 193 (80th%'ile).  . Essential hypertension   . Hiatal hernia    a. 08/2014 CT chest: large hiatal hernia.  Marland Kitchen History of DVT (deep vein thrombosis)   . Hyperlipidemia    a. statin intolerant;  b. on zetia and repatha.  . Palpitations    a. 02/2015 Event Monitor: NSR w/ rare, short episodes of A Tach, rare short runs of SVT < 15 beats.    Past Surgical History:  Procedure Laterality Date  . CATARACT EXTRACTION, BILATERAL    . LAPAROSCOPIC APPENDECTOMY N/A 12/02/2015   Procedure: APPENDECTOMY LAPAROSCOPIC;  Surgeon: Clovis Riley, MD;  Location: [facility];  Service: General;  Laterality: N/A;  . ROOT CANAL  2013  . ROOT CANAL    . VAGINAL HYSTERECTOMY     abdominal hysterectomy    There were no vitals filed for this visit.  Subjective Assessment - 04/19/18 1120    Subjective  L shoulder is doing ok, 2-3/10 L shoulder when reaching across. Nothing like it was.     Pertinent History  L shoulder and L hip pain. L shoulder bothers her more which keeps her from sleeping. Difficulty laying in her L side. Difficulty donning  and doffing shirts and sweaters. Not sure how much of her pain is related to her cholesterol medicine. Has had reactions to her cholesterol medicines before.   L shoulder pain started about a little over a year ago which has gradually worsened. Also has a difficult time walking after sitting for a while.  L shoulder pain occured gradually. Only noticed L shoulder bothering her sleeping in the past few months.  Has not yet had imaging for her L shoulder and hip.  Pt is R hand dominant.  L hip pain began years ago which disappeared. L hip pain started bothering her consistently for the past 6 months particularly when pt tries to sleep.  Denies unexplained changes in weight.      Patient Stated Goals  Want to be able to keep moving.     Currently in Pain?  Yes    Pain Score  3     Pain Onset  More than a month ago                               PT Education - 04/19/18 1541    Education Details  ther-ex, HEP  Person(s) Educated  Patient    Methods  Explanation;Demonstration;Tactile cues;Verbal cues;Handout    Comprehension  Returned demonstration;Verbalized understanding        Objectives   Medbridge Access Coe: [facility]  Denies personal history of CA or osteoporosis, no pacemakers or defibrillators. Denies latex band allergies   Manual therapy  Seated STM L upper trap muscle area. Decreased neck discomfort with R rotation, no change with shoulder adduction comfort level   Therapeutic exercise  Seated manually resisted L scapular retraction targeting the lower trap muscles  10x5 seconds for 3 sets  Standing L shoulder horizontal abduction red band10x2 No pain with reaching across afrerwards   Reviewed and given as part of her HEP. Pt demonstrated and verbalized understanding. Handout provided.   TRX scapular retractions 10x2  L shoulder IR   Blue band 10x  Then at [facility] machine plate 5 for 5x  OMEGA rows plate 20 for  [id number]  Standing pectorals stretch 30 seconds x 2    L shoulder isometric IR at about 90 degrees flexion and about 90 degrees IR 10x5 seconds for 3 sets  To promote posterior glide of L humeral head    Improved exercise technique, movement at target joints, use of target muscles after min to mod verbal, visual, tactile cues.  Improved comfort level with reaching across with her L UE following resisted L shoulder horizontal abduction exercise. Gave as part of her HEP to help with carry over of progress. Continued scapular, posterior shoulder and shoulder IR strengthening to help with progress. Pt tolerated session well without aggravation of symptoms. Pt will benefit from continued skilled physical therapy services to decrease pain, improve strength and function.      PT Short Term Goals - 03/22/18 1712      PT SHORT TERM GOAL #1   Title  Patient will be independent with her HEP to decrease L shoulder and hip pain, improve strength and function.     Baseline  Pt has started her HEP (02/07/2018); Pt performing her HEP (03/22/2018)    Time  3    Period  Weeks    Status  Achieved    Target Date  03/01/18        PT Long Term Goals - 04/05/18 1211      PT LONG TERM GOAL #1   Title  Patient will have a decrease in L shoulder pain to 2/10 or less at worst to promote ability to don and doff her shirts and sweaters, as well as sleep on her L side.     Baseline  5/10 L shoulder pain at most for the past 3 months (02/07/2018); 4/10 at most for the past 7 days (03/22/2018); 3/10 at most for the past 7 days (04/05/2018)    Time  4    Period  Weeks    Status  Partially Met    Target Date  05/03/18      PT LONG TERM GOAL #2   Title  Patient will have a decrease in L hip pain to 3/10 or less at worst to promote ability to perform standing tasks such as stair negotiation.     Baseline  7/10 L hip pain at most for the past 3 months (02/07/2018); 2/10 L hip at most for the past 7  days (03/22/2018)    Time  8    Period  Weeks    Status  Achieved    Target Date  04/05/18      PT LONG  TERM GOAL #3   Title  Patient will improve L shoulder ER strength by at least 1/2 MMT grade to promote ability to raise her arm and use it to perform functional tasks more comfortably.     Baseline  L shoulder ER 4-/5 (02/07/2018); 4+/5 (03/22/2018)    Time  8    Period  Weeks    Status  Achieved    Target Date  04/05/18      PT LONG TERM GOAL #4   Title  Patient will improve L glute med and max strength by at least 1/2 MMT grade to promote ability to perform standing tasks such as stair negotiation more comfortably for her L hip.     Baseline  L hip extension 4-/5 (seated manually resisted), hip abduction 4/5 (seated manually resisted clamshell isometrics) 02/07/2018, L hip extension 4/5 (seated manually resisted), hip abduction 5/5 (seated manually resisted clamshell isometrics)     Time  8    Period  Weeks    Status  Achieved    Target Date  04/05/18      PT LONG TERM GOAL #5   Title  Patient will improve her L shoulder FOTO score by at least 10 points as a demonstration of improved function.     Baseline  L shoulder FOTO 55 (02/07/2018); 64 (03/22/2018)    Time  4    Period  Weeks    Status  Partially Met    Target Date  05/03/18      PT LONG TERM GOAL #6   Title  Patient will improve her Quick Dash score by at least 10% as a demonstration of improved function.     Baseline  29.5%  (02/07/2018); 18.2% (03/22/2018)    Time  8    Period  Weeks    Status  Achieved    Target Date  04/05/18            Plan - 04/19/18 1541    Clinical Impression Statement  Improved comfort level with reaching across with her L UE following resisted L shoulder horizontal abduction exercise. Gave as part of her HEP to help with carry over of progress. Continued scapular, posterior shoulder and shoulder IR strengthening to help with progress. Pt tolerated session well without aggravation of  symptoms. Pt will benefit from continued skilled physical therapy services to decrease pain, improve strength and function.     Rehab Potential  Fair    Clinical Impairments Affecting Rehab Potential  (-) Chronicity of condition, weakness, multiple areas of pain, age; (+) motivated    PT Frequency  2x / week    PT Duration  8 weeks    PT Treatment/Interventions  Therapeutic activities;Therapeutic exercise;Neuromuscular re-education;Patient/family education;Manual techniques;Dry needling;Aquatic Therapy;Electrical Stimulation;Iontophoresis 83m/ml Dexamethasone;Ultrasound    PT Next Visit Plan  scapular, glute med, max, trunk strengthening, gentle infraspinatus muscle strengthening, manual techniques, modalities PRN    Consulted and Agree with Plan of Care  Patient       Patient will benefit from skilled therapeutic intervention in order to improve the following deficits and impairments:  Pain, Postural dysfunction, Improper body mechanics, Impaired UE functional use, Decreased strength, Decreased range of motion, Abnormal gait  Visit Diagnosis: Chronic left shoulder pain  Muscle weakness (generalized)     Problem List Patient Active Problem List   Diagnosis Date Noted  . DOE (dyspnea on exertion) 01/17/2018  . Proteinuria 01/17/2018  . Left hip pain 01/17/2018  . Rotator cuff impingement syndrome of left  shoulder 01/17/2018  . Myalgia 07/18/2017  . Neuropathy 01/13/2017  . Arrhythmia   . Palpitations   . Essential hypertension   . Right foot pain 03/17/2015  . Non-alcoholic fatty liver disease 03/03/2015  . Gallbladder attack 01/28/2015  . Thyroid nodule 01/26/2015  . CAD (coronary artery disease) 01/26/2015  . Tachycardia 07/30/2014  . Foot swelling 10/30/2012  . Obesity 09/05/2010  . Hyperlipidemia 04/14/2010    Joneen Boers PT, DPT   04/19/2018, 3:56 PM  [facility]  [address], [place],  [address] Phone: [phone]   Fax:  [phone]  Name: SHAKIA SEBASTIANO MRN: [id number] Date of Birth: 23-Jan-1946

## 2018-04-23 ENCOUNTER — Ambulatory Visit: Payer: Medicare Other

## 2018-04-23 DIAGNOSIS — M25512 Pain in left shoulder: Secondary | ICD-10-CM | POA: Diagnosis not present

## 2018-04-23 DIAGNOSIS — M25552 Pain in left hip: Secondary | ICD-10-CM | POA: Diagnosis not present

## 2018-04-23 DIAGNOSIS — G8929 Other chronic pain: Secondary | ICD-10-CM

## 2018-04-23 DIAGNOSIS — M6281 Muscle weakness (generalized): Secondary | ICD-10-CM | POA: Diagnosis not present

## 2018-04-23 NOTE — Therapy (Signed)
[facility] [address], [place], [address] Phone: [phone]   Fax:  [phone]  Physical Therapy Treatment  Patient Details  Name: Dawn Herring MRN: [id number] Date of Birth: Jun 04, 1945 Referring Provider (PT): Tommi Rumps, MD   Encounter Date: 04/23/2018  PT End of Session - 04/23/18 1302    Visit Number  19    Number of Visits  25    Date for PT Re-Evaluation  05/03/18    Authorization Type  9    Authorization Time Period  of 10 progress report    PT Start Time  1302    PT Stop Time  1343    PT Time Calculation (min)  41 min    Activity Tolerance  Patient tolerated treatment well    Behavior During Therapy  [facility] for tasks assessed/performed       Past Medical History:  Diagnosis Date  . Chest pain    a. Neg stress tests - 2005, 2008, 2009;  b. 08/2014 Cardiac CT: Ca score 193 (80th%'ile).  . Essential hypertension   . Hiatal hernia    a. 08/2014 CT chest: large hiatal hernia.  Marland Kitchen History of DVT (deep vein thrombosis)   . Hyperlipidemia    a. statin intolerant;  b. on zetia and repatha.  . Palpitations    a. 02/2015 Event Monitor: NSR w/ rare, short episodes of A Tach, rare short runs of SVT < 15 beats.    Past Surgical History:  Procedure Laterality Date  . CATARACT EXTRACTION, BILATERAL    . LAPAROSCOPIC APPENDECTOMY N/A 12/02/2015   Procedure: APPENDECTOMY LAPAROSCOPIC;  Surgeon: Clovis Riley, MD;  Location: [facility];  Service: General;  Laterality: N/A;  . ROOT CANAL  2013  . ROOT CANAL    . VAGINAL HYSTERECTOMY     abdominal hysterectomy    There were no vitals filed for this visit.  Subjective Assessment - 04/23/18 1304    Subjective  L shoulder feels pretty good. No pain currently. Has not done anything exciting. Initial discomfort with reaching across which disappears.  2/10 L shoulder pain at most for the past 7 days.      Pertinent History  L shoulder and L hip pain. L  shoulder bothers her more which keeps her from sleeping. Difficulty laying in her L side. Difficulty donning and doffing shirts and sweaters. Not sure how much of her pain is related to her cholesterol medicine. Has had reactions to her cholesterol medicines before.   L shoulder pain started about a little over a year ago which has gradually worsened. Also has a difficult time walking after sitting for a while.  L shoulder pain occured gradually. Only noticed L shoulder bothering her sleeping in the past few months.  Has not yet had imaging for her L shoulder and hip.  Pt is R hand dominant.  L hip pain began years ago which disappeared. L hip pain started bothering her consistently for the past 6 months particularly when pt tries to sleep.  Denies unexplained changes in weight.      Patient Stated Goals  Want to be able to keep moving.     Currently in Pain?  No/denies    Pain Score  0-No pain    Pain Onset  More than a month ago  PT Education - 04/23/18 1346    Education Details  ther-ex    Person(s) Educated  Patient    Methods  Explanation;Demonstration;Tactile cues;Verbal cues    Comprehension  Returned demonstration;Verbalized understanding      Objectives   Medbridge Access Coe: [facility]  Denies personal history of CA or osteoporosis, no pacemakers or defibrillators. Denies latex band allergies    Therapeutic exercise  Seated manually resisted L scapular retraction targeting the lower trap muscles  10x5 seconds for 3 sets  TRX scapular retractions 10x3  L shoulder IR              Charcoal band 10x2  No L shoulder discomfort with reaching across after aforementioned exercises  Lat pull down plate 15 for [id number]    Standing pectorals stretch 30 seconds x 2  Standing B shoulder horizontal abduction red band 10x3  Lower trap raise at wall 10x 5 seconds fir 2 sets  L shoulder extension, adduction,  IR from end range scaption  Red band 7x. L shoulder symptoms during return motion   L shoulder PNF D2 flexion yellow band   10x2    L shoulder isometric IR at about 90 degrees flexion and about 90 degrees IR 10x5 seconds for 3 sets  To promote posterior glide of L humeral head   OMEGA rows plate 20 for [id number]   Improved exercise technique, movement at target joints, use of target muscles after min to mod verbal, visual, tactile cues.    Pt tolerated session well without aggravation of symptoms. No anterior L shoulder pain after session.    Pt tolerated session well without aggravation of symptoms. No anterior L shoulder pain after session. Pt making very good progress with L shoulder pain goals based on subjective reports of 2/10 L shoulder pain at worst for the past 7 days and decreasing pain with reaching across. Continued working on L shoulder IR, posterior deltiod and scapular muscle strengthening to promote progress. Pt will benefit from continued skilled physical therapy services to decrease pain, improve strength and function.        PT Short Term Goals - 03/22/18 1712      PT SHORT TERM GOAL #1   Title  Patient will be independent with her HEP to decrease L shoulder and hip pain, improve strength and function.     Baseline  Pt has started her HEP (02/07/2018); Pt performing her HEP (03/22/2018)    Time  3    Period  Weeks    Status  Achieved    Target Date  03/01/18        PT Long Term Goals - 04/05/18 1211      PT LONG TERM GOAL #1   Title  Patient will have a decrease in L shoulder pain to 2/10 or less at worst to promote ability to don and doff her shirts and sweaters, as well as sleep on her L side.     Baseline  5/10 L shoulder pain at most for the past 3 months (02/07/2018); 4/10 at most for the past 7 days (03/22/2018); 3/10 at most for the past 7 days (04/05/2018)    Time  4    Period  Weeks    Status  Partially Met    Target Date  05/03/18       PT LONG TERM GOAL #2   Title  Patient will have a decrease in L hip pain to 3/10 or less at worst to promote ability to perform  standing tasks such as stair negotiation.     Baseline  7/10 L hip pain at most for the past 3 months (02/07/2018); 2/10 L hip at most for the past 7 days (03/22/2018)    Time  8    Period  Weeks    Status  Achieved    Target Date  04/05/18      PT LONG TERM GOAL #3   Title  Patient will improve L shoulder ER strength by at least 1/2 MMT grade to promote ability to raise her arm and use it to perform functional tasks more comfortably.     Baseline  L shoulder ER 4-/5 (02/07/2018); 4+/5 (03/22/2018)    Time  8    Period  Weeks    Status  Achieved    Target Date  04/05/18      PT LONG TERM GOAL #4   Title  Patient will improve L glute med and max strength by at least 1/2 MMT grade to promote ability to perform standing tasks such as stair negotiation more comfortably for her L hip.     Baseline  L hip extension 4-/5 (seated manually resisted), hip abduction 4/5 (seated manually resisted clamshell isometrics) 02/07/2018, L hip extension 4/5 (seated manually resisted), hip abduction 5/5 (seated manually resisted clamshell isometrics)     Time  8    Period  Weeks    Status  Achieved    Target Date  04/05/18      PT LONG TERM GOAL #5   Title  Patient will improve her L shoulder FOTO score by at least 10 points as a demonstration of improved function.     Baseline  L shoulder FOTO 55 (02/07/2018); 64 (03/22/2018)    Time  4    Period  Weeks    Status  Partially Met    Target Date  05/03/18      PT LONG TERM GOAL #6   Title  Patient will improve her Quick Dash score by at least 10% as a demonstration of improved function.     Baseline  29.5%  (02/07/2018); 18.2% (03/22/2018)    Time  8    Period  Weeks    Status  Achieved    Target Date  04/05/18            Plan - 04/23/18 1257    Clinical Impression Statement  Pt tolerated session well without  aggravation of symptoms. No anterior L shoulder pain after session. Pt making very good progress with L shoulder pain goals based on subjective reports of 2/10 L shoulder pain at worst for the past 7 days and decreasing pain with reaching across. Continued working on L shoulder IR, posterior deltiod and scapular muscle strengthening to promote progress. Pt will benefit from continued skilled physical therapy services to decrease pain, improve strength and function.     Rehab Potential  Fair    Clinical Impairments Affecting Rehab Potential  (-) Chronicity of condition, weakness, multiple areas of pain, age; (+) motivated    PT Frequency  2x / week    PT Duration  8 weeks    PT Treatment/Interventions  Therapeutic activities;Therapeutic exercise;Neuromuscular re-education;Patient/family education;Manual techniques;Dry needling;Aquatic Therapy;Electrical Stimulation;Iontophoresis 38m/ml Dexamethasone;Ultrasound    PT Next Visit Plan  scapular, glute med, max, trunk strengthening, gentle infraspinatus muscle strengthening, manual techniques, modalities PRN    Consulted and Agree with Plan of Care  Patient       Patient will benefit from skilled therapeutic intervention  in order to improve the following deficits and impairments:  Pain, Postural dysfunction, Improper body mechanics, Impaired UE functional use, Decreased strength, Decreased range of motion, Abnormal gait  Visit Diagnosis: Chronic left shoulder pain  Muscle weakness (generalized)     Problem List Patient Active Problem List   Diagnosis Date Noted  . DOE (dyspnea on exertion) 01/17/2018  . Proteinuria 01/17/2018  . Left hip pain 01/17/2018  . Rotator cuff impingement syndrome of left shoulder 01/17/2018  . Myalgia 07/18/2017  . Neuropathy 01/13/2017  . Arrhythmia   . Palpitations   . Essential hypertension   . Right foot pain 03/17/2015  . Non-alcoholic fatty liver disease 03/03/2015  . Gallbladder attack 01/28/2015  .  Thyroid nodule 01/26/2015  . CAD (coronary artery disease) 01/26/2015  . Tachycardia 07/30/2014  . Foot swelling 10/30/2012  . Obesity 09/05/2010  . Hyperlipidemia 04/14/2010    Joneen Boers PT, DPT   04/23/2018, 1:54 PM  [facility] [address], [place], [address] Phone: [phone]   Fax:  [phone]  Name: Dawn Herring MRN: [id number] Date of Birth: 25-May-1945

## 2018-04-26 ENCOUNTER — Ambulatory Visit: Payer: Medicare Other

## 2018-04-26 DIAGNOSIS — M25552 Pain in left hip: Secondary | ICD-10-CM | POA: Diagnosis not present

## 2018-04-26 DIAGNOSIS — G8929 Other chronic pain: Secondary | ICD-10-CM

## 2018-04-26 DIAGNOSIS — M25512 Pain in left shoulder: Principal | ICD-10-CM

## 2018-04-26 DIAGNOSIS — M6281 Muscle weakness (generalized): Secondary | ICD-10-CM

## 2018-04-26 NOTE — Therapy (Signed)
[facility] [address] [place], [place], [address] Phone: [phone]   Fax:  [phone]  Physical Therapy Treatment And Discharge Summary   Patient Details  Name: Dawn Herring MRN: [id number] Date of Birth: 12/25/1945 Referring Provider (PT): Eric Sonnenberg, MD   Encounter Date: 04/26/2018  PT End of Session - 04/26/18 1113    Visit Number  20    Number of Visits  25    Date for PT Re-Evaluation  05/03/18    Authorization Type  10    Authorization Time Period  of 10 progress report    PT Start Time  1114    PT Stop Time  1202    PT Time Calculation (min)  48 min    Activity Tolerance  Patient tolerated treatment well    Behavior During Therapy  W[facility] for tasks assessed/performed       Past Medical History:  Diagnosis Date  . Chest pain    a. Neg stress tests - 2005, 2008, 2009;  b. 08/2014 Cardiac CT: Ca score 193 (80th%'ile).  . Essential hypertension   . Hiatal hernia    a. 08/2014 CT chest: large hiatal hernia.  . History of DVT (deep vein thrombosis)   . Hyperlipidemia    a. statin intolerant;  b. on zetia and repatha.  . Palpitations    a. 02/2015 Event Monitor: NSR w/ rare, short episodes of A Tach, rare short runs of SVT < 15 beats.    Past Surgical History:  Procedure Laterality Date  . CATARACT EXTRACTION, BILATERAL    . LAPAROSCOPIC APPENDECTOMY N/A 12/02/2015   Procedure: APPENDECTOMY LAPAROSCOPIC;  Surgeon: Chelsea A Connor, MD;  Location: [facility];  Service: General;  Laterality: N/A;  . ROOT CANAL  2013  . ROOT CANAL    . VAGINAL HYSTERECTOMY     abdominal hysterectomy    There were no vitals filed for this visit.  Subjective Assessment - 04/26/18 1115    Subjective  Feels like the weather. No L shoulder pain currently. Felt R shoulder pain when reaching across to wash her back this morning.  No pain when reaching across currently.  L hip is ok. It has not bothered her.  Feels like  she can continue progress with her HEP.     Pertinent History  L shoulder and L hip pain. L shoulder bothers her more which keeps her from sleeping. Difficulty laying in her L side. Difficulty donning and doffing shirts and sweaters. Not sure how much of her pain is related to her cholesterol medicine. Has had reactions to her cholesterol medicines before.   L shoulder pain started about a little over a year ago which has gradually worsened. Also has a difficult time walking after sitting for a while.  L shoulder pain occured gradually. Only noticed L shoulder bothering her sleeping in the past few months.  Has not yet had imaging for her L shoulder and hip.  Pt is R hand dominant.  L hip pain began years ago which disappeared. L hip pain started bothering her consistently for the past 6 months particularly when pt tries to sleep.  Denies unexplained changes in weight.      Patient Stated Goals  Want to be able to keep moving.     Currently in Pain?  No/denies    Pain Score  0-No pain    Pain Onset  More than a month ago                                 PT Education - 04/26/18 1144    Education Details  ther-ex    Person(s) Educated  Patient    Methods  Explanation;Demonstration;Tactile cues;Verbal cues    Comprehension  Returned demonstration;Verbalized understanding        Objectives   Medbridge Access Coe: [facility]  Denies personal history of CA or osteoporosis, no pacemakers or defibrillators. Denies latex band allergies    Therapeutic exercise  Standing pectorals stretch 30 seconds x 2  Seated manually resisted L scapular retraction targeting the lower trap muscles  10x5 seconds for 3 sets  L shoulder IR  Charcoal band 10x2  Lower trap raise at wall 10x 5 seconds for 2 sets  TRX scapular retractions 10x3   Lat pull down plate 15 for 10x3               Manually resisted L shoulder IR at 90 degrees flexion 10x5  seconds for 3 sets  Seated manually resisted L shoulder horizontal adduction concentric and eccentric 10x3  Sit <> stand with green band resisting hip abduction/ER  5x2, chair with arms, emphasis on femoral control   Standing hip abduction yellow band around ankles with B UE assist 10x2 each LE  Improved exercise technique, movement at target joints, use of target muscles after min to mod verbal, visual, tactile cues.    Pt has made very good progress with decreased L shoulder pain with donning and doffing shirts and sweaters as well as with reaching across with her L arm. Pt also demonstrates decreased L hip pain and improved function since initial evaluation. Skilled physical therapy services discharged with patient continuing her progress with her exercises at home.                  PT Short Term Goals - 03/22/18 1712      PT SHORT TERM GOAL #1   Title  Patient will be independent with her HEP to decrease L shoulder and hip pain, improve strength and function.     Baseline  Pt has started her HEP (02/07/2018); Pt performing her HEP (03/22/2018)    Time  3    Period  Weeks    Status  Achieved    Target Date  03/01/18        PT Long Term Goals - 04/26/18 1222      PT LONG TERM GOAL #1   Title  Patient will have a decrease in L shoulder pain to 2/10 or less at worst to promote ability to don and doff her shirts and sweaters, as well as sleep on her L side.     Baseline  5/10 L shoulder pain at most for the past 3 months (02/07/2018); 4/10 at most for the past 7 days (03/22/2018); 3/10 at most for the past 7 days (04/05/2018); 2/10 at most for the past 7 days (04/23/2018).     Time  4    Period  Weeks    Status  Achieved    Target Date  05/03/18      PT LONG TERM GOAL #2   Title  Patient will have a decrease in L hip pain to 3/10 or less at worst to promote ability to perform standing tasks such as stair negotiation.     Baseline  7/10 L hip pain at most for the  past 3 months (02/07/2018); 2/10 L hip at most for the past 7 days (03/22/2018)    Time  8    Period  Weeks      Status  Achieved    Target Date  04/05/18      PT LONG TERM GOAL #3   Title  Patient will improve L shoulder ER strength by at least 1/2 MMT grade to promote ability to raise her arm and use it to perform functional tasks more comfortably.     Baseline  L shoulder ER 4-/5 (02/07/2018); 4+/5 (03/22/2018)    Time  8    Period  Weeks    Status  Achieved    Target Date  04/05/18      PT LONG TERM GOAL #4   Title  Patient will improve L glute med and max strength by at least 1/2 MMT grade to promote ability to perform standing tasks such as stair negotiation more comfortably for her L hip.     Baseline  L hip extension 4-/5 (seated manually resisted), hip abduction 4/5 (seated manually resisted clamshell isometrics) 02/07/2018, L hip extension 4/5 (seated manually resisted), hip abduction 5/5 (seated manually resisted clamshell isometrics)     Time  8    Period  Weeks    Status  Achieved    Target Date  04/05/18      PT LONG TERM GOAL #5   Title  Patient will improve her L shoulder FOTO score by at least 10 points as a demonstration of improved function.     Baseline  L shoulder FOTO 55 (02/07/2018); 64 (03/22/2018); 61 (04/26/2018)    Time  4    Period  Weeks    Status  Partially Met    Target Date  05/03/18      PT LONG TERM GOAL #6   Title  Patient will improve her Quick Dash score by at least 10% as a demonstration of improved function.     Baseline  29.5%  (02/07/2018); 18.2% (03/22/2018); 4.5 % (04/26/2018)    Time  8    Period  Weeks    Status  Achieved    Target Date  04/05/18            Plan - 04/26/18 1112    Clinical Impression Statement  Pt has made very good progress with decreased L shoulder pain with donning and doffing shirts and sweaters as well as with reaching across with her L arm. Pt also demonstrates decreased L hip pain and improved function  since initial evaluation. Skilled physical therapy services discharged with patient continuing her progress with her exercises at home.     History and Personal Factors relevant to plan of care:  Chronicity of condition, L shoulder pain    Clinical Presentation  Stable    Clinical Presentation due to:  Pt has made very good progress with PT towards goals.     Clinical Decision Making  Low    Rehab Potential  --    Clinical Impairments Affecting Rehab Potential  --    PT Frequency  --    PT Duration  --    PT Treatment/Interventions  Therapeutic activities;Therapeutic exercise;Neuromuscular re-education;Patient/family education;Manual techniques    PT Next Visit Plan  Continue progress with PT towards goals.     Consulted and Agree with Plan of Care  Patient       Patient will benefit from skilled therapeutic intervention in order to improve the following deficits and impairments:  Pain, Postural dysfunction, Improper body mechanics, Impaired UE functional use, Decreased strength, Decreased range of motion, Abnormal gait  Visit Diagnosis: Chronic left shoulder pain  Muscle weakness (generalized)    Pain in left hip     Problem List Patient Active Problem List   Diagnosis Date Noted  . DOE (dyspnea on exertion) 01/17/2018  . Proteinuria 01/17/2018  . Left hip pain 01/17/2018  . Rotator cuff impingement syndrome of left shoulder 01/17/2018  . Myalgia 07/18/2017  . Neuropathy 01/13/2017  . Arrhythmia   . Palpitations   . Essential hypertension   . Right foot pain 03/17/2015  . Non-alcoholic fatty liver disease 03/03/2015  . Gallbladder attack 01/28/2015  . Thyroid nodule 01/26/2015  . CAD (coronary artery disease) 01/26/2015  . Tachycardia 07/30/2014  . Foot swelling 10/30/2012  . Obesity 09/05/2010  . Hyperlipidemia 04/14/2010   Thank you for your referral.  Joneen Boers PT, DPT   04/26/2018, 12:37 PM  [facility] [address] [place], [address] Phone: [phone]   Fax:  [phone]  Name: Dawn Herring MRN: [id number] Date of Birth: May 17, 1945

## 2018-04-26 NOTE — Patient Instructions (Addendum)
  Medbridge Access Coe: [facility]  Single Arm Doorway Pec Stretch at 90 Degrees Abduction  30 seconds x 2 for 2x/day    Standing Hip Abduction with Resistance at Ankles and Counter Support  Yellow band 10x2 each LE       Sitting down on a chair, with a green band around your knees   Stand up and sit down, keeping your knees shoulder width apart   Repeat 5 times,    Perform 2 sets daily.

## 2018-05-02 ENCOUNTER — Ambulatory Visit: Payer: Medicare Other

## 2018-05-24 DIAGNOSIS — H26492 Other secondary cataract, left eye: Secondary | ICD-10-CM | POA: Diagnosis not present

## 2018-06-25 ENCOUNTER — Telehealth: Payer: Self-pay

## 2018-06-25 MED ORDER — PROPRANOLOL HCL 10 MG PO TABS: 10.0000 mg | ORAL_TABLET | Freq: Three times a day (TID) | ORAL | 2 refills | Status: DC

## 2018-06-25 NOTE — Telephone Encounter (Signed)
Requested Prescriptions   Signed Prescriptions Disp Refills  . propranolol (INDERAL) 10 MG tablet 270 tablet 2    Sig: Take 1 tablet (10 mg total) by mouth 3 (three) times daily.    Authorizing Provider: Minna Merritts    Ordering User: Raelene Bott, BRANDY L

## 2018-07-20 ENCOUNTER — Ambulatory Visit: Payer: Medicare Other | Admitting: Family Medicine

## 2018-07-30 ENCOUNTER — Other Ambulatory Visit: Payer: Self-pay

## 2018-07-30 MED ORDER — EZETIMIBE 10 MG PO TABS: 10.0000 mg | ORAL_TABLET | Freq: Every day | ORAL | 3 refills | Status: DC

## 2018-10-04 ENCOUNTER — Other Ambulatory Visit: Payer: Self-pay

## 2018-10-04 MED ORDER — LOSARTAN POTASSIUM-HCTZ 100-12.5 MG PO TABS: 1.0000 | ORAL_TABLET | Freq: Every day | ORAL | 1 refills | Status: DC

## 2018-10-04 NOTE — Telephone Encounter (Signed)
*  STAT* If patient is at the pharmacy, call can be transferred to refill team.   1. Which medications need to be refilled? (please list name of each medication and dose if known)  Losartan-Hydrochlorothiazide   2. Which pharmacy/location (including street and city if local pharmacy) is medication to be sent to?  [facility]  3. Do they need a 30 day or 90 day supply? [place]

## 2018-10-08 ENCOUNTER — Other Ambulatory Visit: Payer: Self-pay | Admitting: *Deleted

## 2018-10-25 ENCOUNTER — Other Ambulatory Visit: Payer: Self-pay

## 2019-03-08 ENCOUNTER — Other Ambulatory Visit: Payer: Self-pay | Admitting: *Deleted

## 2019-03-08 MED ORDER — PRALUENT 150 MG/ML ~~LOC~~ SOAJ: 150.0000 mg | SUBCUTANEOUS | 0 refills | Status: DC

## 2019-03-15 ENCOUNTER — Other Ambulatory Visit: Payer: Self-pay | Admitting: Cardiovascular Disease

## 2019-04-03 ENCOUNTER — Other Ambulatory Visit: Payer: Self-pay | Admitting: Cardiovascular Disease

## 2019-04-05 NOTE — Progress Notes (Signed)
Cardiology Office Note  Date:  04/08/2019   ID:  Dawn Herring, DOB [birth year], MRN [id number]  PCP:  Leone Haven, MD   Chief Complaint  Patient presents with  . other    12 month f/u c/o irregular heart beat/rapid heart beat and sob. Pt mentioned having issues with Praluent itching and mobility problems. Meds reviewed verbally with pt.    HPI:  [name] is a very pleasant 74 year old woman with past medical history of  hypertension,  familial hypercholesterolemia,  chest pain and atrial tachycardia, dating back >10 years with negative stress tests Prior CT coronary calcium score  190,  started on more aggressive cholesterol regiment at that time PVCs seen on prior EKGs Event monitor December 2016 showing APCs, runs of  atrial tachycardia Who presents for routine followup of her hyperlipidemia hypertension, and arrhythmia  Feels like she is having reaction to praluent Been on praluent for years Hip discomfort, runny nose , itching  12/30th had tachycardia Had back tightness Recorded on the watch Does deep breathing, rhythm broke Took advil for back Not taking flecainide Take propranolol every night 6 episodes a year, arrhythmia  Shoulder pain, neck issue, completed physical therapy  SOB with climbing steps No regular exercise  EKG personally reviewed by myself on todays visit Shows sinus bradycardia rate 56 bpm T wave abnormality V3 through V6 1 and aVL  Other past medical history reviewed Cataract surgery: sept 26th, 2017 Second cataract surgery  last Thursday, anesthesia noted irregular rhythm She did not notice any symptoms Reports that she has not felt well since that time, feels weak  Off pralunt, changed to repatha,  She received samples from the office, did not the to fill her prescription despite aggressive marketing from [facility] pharmacy. Was told that they would cancel her prescription. She was paying $600 per month and reports that she was  able to afford this. Prescription now canceled? Has been without medication for several months  Previous 30 day monitor again reviewed with her showing runs of atrial tachycardia, run of SVT, APCs, PVCs  Previously had significant myalgias on the fluvastatin  Symptoms improved by holding the medication Previously had gallbladder attack  and in the setting of severe pain, had tachycardia concerning for arrhythmia  Prior episode of tachycardia concerning for arrhythmia in the setting of back pain in the past  Strong family history of familial hypercholesterolemia.  She has tried numerous cholesterol medications in the past including Crestor, Lipitor, lovastatin. She did not tolerate these secondary to side effects.    PMH:   has a past medical history of Chest pain, Essential hypertension, Hiatal hernia, History of DVT (deep vein thrombosis), Hyperlipidemia, and Palpitations.  PSH:    Past Surgical History:  Procedure Laterality Date  . CATARACT EXTRACTION, BILATERAL    . LAPAROSCOPIC APPENDECTOMY N/A 12/02/2015   Procedure: APPENDECTOMY LAPAROSCOPIC;  Surgeon: Clovis Riley, MD;  Location: [facility];  Service: General;  Laterality: N/A;  . ROOT CANAL  2013  . ROOT CANAL    . VAGINAL HYSTERECTOMY     abdominal hysterectomy    Current Outpatient Medications  Medication Sig Dispense Refill  . Alirocumab (PRALUENT) 150 MG/ML SOAJ Inject 150 mg into the skin every 14 (fourteen) days. 6 pen 0  . aspirin 81 MG tablet Take 81 mg by mouth daily.     . colchicine 0.6 MG tablet Take 1 tablet (0.6 mg total) by mouth 2 (two) times daily as needed. As needed for gout  flare 30 tablet 1  . ezetimibe (ZETIA) 10 MG tablet Take 1 tablet (10 mg total) by mouth daily. 90 tablet 3  . flecainide (TAMBOCOR) 50 MG tablet Take 1 tablet (50 mg total) by mouth 2 (two) times daily as needed. 60 tablet 6  . losartan-hydrochlorothiazide (HYZAAR) 100-12.5 MG tablet TAKE 1 TABLET BY MOUTH EVERY DAY 90 tablet 0  .  omeprazole (PRILOSEC) 20 MG capsule Take 20 mg by mouth daily.      . propranolol (INDERAL) 10 MG tablet TAKE 1 TABLET BY MOUTH THREE TIMES A DAY (Patient taking differently: Takes qd.) 270 tablet 0   No current facility-administered medications for this visit.     Allergies:   Statins   Social History:  The patient  reports that she has never smoked. She has never used smokeless tobacco. She reports that she does not drink alcohol or use drugs.   Family History:   family history includes Colon cancer in her unknown relative; Heart disease in her brother, father, and mother; Hyperlipidemia in her sister; Stroke in her brother; Thyroid cancer in her brother.    Review of Systems: Review of Systems  Constitutional: Negative.   HENT: Negative.   Respiratory: Negative.   Cardiovascular:       Episodes of tachycardia  Gastrointestinal: Negative.   Musculoskeletal: Negative.   Neurological: Negative.   Psychiatric/Behavioral: Negative.   All other systems reviewed and are negative.    PHYSICAL EXAM: VS:  BP 132/70 (BP Location: Left Arm, Patient Position: Sitting, Cuff Size: Normal)   Pulse (!) 56   Ht 5\' 6"  (1.676 m)   Wt 199 lb 4 oz (90.4 kg)   SpO2 97%   BMI 32.16 kg/m  , BMI Body mass index is 32.16 kg/m. Constitutional:  oriented to person, place, and time. No distress.  HENT:  Head: Grossly normal Eyes:  no discharge. No scleral icterus.  Neck: No JVD, no carotid bruits  Cardiovascular: Regular rate and rhythm, no murmurs appreciated Pulmonary/Chest: Clear to auscultation bilaterally, no wheezes or rails Abdominal: Soft.  no distension.  no tenderness.  Musculoskeletal: Normal range of motion Neurological:  normal muscle tone. Coordination normal. No atrophy Skin: Skin warm and dry Psychiatric: normal affect, pleasant  Recent Labs: No results found for requested labs within last 8760 hours.    Lipid Panel Lab Results  Component Value Date   CHOL 198  04/04/2018   HDL 54 04/04/2018   LDLCALC 94 04/04/2018   TRIG 248 (H) 04/04/2018    Wt Readings from Last 3 Encounters:  04/08/19 199 lb 4 oz (90.4 kg)  04/04/18 194 lb (88 kg)  01/17/18 195 lb 3.2 oz (88.5 kg)     ASSESSMENT AND PLAN:  Pure hypercholesterolemia - Plan: EKG 12-Lead on her praluent,  We will order lab work today in the office She is concerned about allergy to Praluent causing itching, hip pain among other things Suggested she could try to hold 1 or 2 doses to see if symptoms resolve She has been on the medication for several years Potentially could go back to [place] if she does have allergies  Coronary artery disease involving native coronary artery of native heart without angina pectoris - Plan: EKG 12-Lead Previous CT coronary calcium score 190 On Praluent Plan as above She denies any anginal symptoms  Cardiac arrhythmia, unspecified cardiac arrhythmia type - Plan: EKG 12-Lead Long discussion concerning her arrhythmia Episodes of atrial tachycardia,  Also APCs and PVCs Takes flecainide very sparingly  Takes propranolol nightly 6 bad episodes per year Previously offered event monitor  Essential hypertension Blood pressure is well controlled on today's visit. No changes made to the medications.  Stable    Total encounter time more than 25 minutes  Greater than 50% was spent in counseling and coordination of care with the patient   Disposition:   F/U  12 months   Orders Placed This Encounter  Procedures  . EKG 12-Lead     Signed, Esmond Plants, M.D., Ph.D. 04/08/2019  [place], [place]

## 2019-04-08 ENCOUNTER — Ambulatory Visit (INDEPENDENT_AMBULATORY_CARE_PROVIDER_SITE_OTHER): Payer: Medicare Other | Admitting: Cardiovascular Disease

## 2019-04-08 ENCOUNTER — Other Ambulatory Visit: Payer: Self-pay

## 2019-04-08 ENCOUNTER — Encounter: Payer: Self-pay | Admitting: Cardiovascular Disease

## 2019-04-08 VITALS — BP 132/70 | HR 56 | Ht 66.0 in | Wt 199.2 lb

## 2019-04-08 DIAGNOSIS — I471 Supraventricular tachycardia: Secondary | ICD-10-CM | POA: Diagnosis not present

## 2019-04-08 DIAGNOSIS — I499 Cardiac arrhythmia, unspecified: Secondary | ICD-10-CM

## 2019-04-08 DIAGNOSIS — I25118 Atherosclerotic heart disease of native coronary artery with other forms of angina pectoris: Secondary | ICD-10-CM

## 2019-04-08 DIAGNOSIS — I10 Essential (primary) hypertension: Secondary | ICD-10-CM | POA: Diagnosis not present

## 2019-04-08 DIAGNOSIS — E782 Mixed hyperlipidemia: Secondary | ICD-10-CM | POA: Diagnosis not present

## 2019-04-08 DIAGNOSIS — Z23 Encounter for immunization: Secondary | ICD-10-CM

## 2019-04-08 NOTE — Patient Instructions (Addendum)
Labs as below  Flu shot  Medication Instructions:  Hold praluent for a shot or two to see if side effects get better  If you need a refill on your cardiac medications before your next appointment, please call your pharmacy.    Lab work: Lipids, CMP   If you have labs (blood work) drawn today and your tests are completely normal, you will receive your results only by: Marland Kitchen MyChart Message (if you have MyChart) OR . A paper copy in the mail If you have any lab test that is abnormal or we need to change your treatment, we will call you to review the results.   Testing/Procedures: No new testing needed   Follow-Up: At [facility], you and your health needs are our priority.  As part of our continuing mission to provide you with exceptional heart care, we have created designated Provider Care Teams.  These Care Teams include your primary Cardiologist (physician) and Advanced Practice Providers (APPs -  Physician Assistants and Nurse Practitioners) who all work together to provide you with the care you need, when you need it.  . You will need a follow up appointment in 12 months   . Providers on your designated Care Team:   . Murray Hodgkins, NP . Christell Faith, PA-C . Marrianne Mood, PA-C  Any Other Special Instructions Will Be Listed Below (If Applicable).  For educational health videos Log in to : [email or web address] Or : [email or web address], password : [email or web address]

## 2019-04-09 LAB — COMPREHENSIVE METABOLIC PANEL
ALT: 12 IU/L (ref 0–32)
AST: 20 IU/L (ref 0–40)
Albumin/Globulin Ratio: 1.4 (ref 1.2–2.2)
Albumin: 4.2 g/dL (ref 3.7–4.7)
Alkaline Phosphatase: 61 IU/L (ref 39–117)
BUN/Creatinine Ratio: 20 (ref 12–28)
BUN: 17 mg/dL (ref 8–27)
Bilirubin Total: 0.4 mg/dL (ref 0.0–1.2)
CO2: 26 mmol/L (ref 20–29)
Calcium: 9.4 mg/dL (ref 8.7–10.3)
Chloride: 102 mmol/L (ref 96–106)
Creatinine, Ser: 0.85 mg/dL (ref 0.57–1.00)
GFR calc Af Amer: 78 mL/min/{1.73_m2} (ref >59)
GFR calc non Af Amer: 68 mL/min/{1.73_m2} (ref >59)
Globulin, Total: 2.9 g/dL (ref 1.5–4.5)
Glucose: 103 mg/dL — ABNORMAL HIGH (ref 65–99)
Potassium: 4.3 mmol/L (ref 3.5–5.2)
Sodium: 142 mmol/L (ref 134–144)
Total Protein: 7.1 g/dL (ref 6.0–8.5)

## 2019-04-09 LAB — LIPID PANEL
Chol/HDL Ratio: 4.2 ratio (ref 0.0–4.4)
Cholesterol, Total: 192 mg/dL (ref 100–199)
HDL: 46 mg/dL (ref >39)
LDL Chol Calc (NIH): 69 mg/dL (ref 0–99)
Triglycerides: 495 mg/dL — ABNORMAL HIGH (ref 0–149)
VLDL Cholesterol Cal: 77 mg/dL — ABNORMAL HIGH (ref 5–40)

## 2019-04-15 ENCOUNTER — Telehealth: Payer: Self-pay | Admitting: *Deleted

## 2019-04-15 NOTE — Telephone Encounter (Signed)
Left voicemail message to call back  

## 2019-04-15 NOTE — Telephone Encounter (Signed)
-----   Message from Minna Merritts, MD sent at 04/14/2019 11:58 AM EST ----- LDL at goal, Cholesterol little high Would continue to work on diet/exercise

## 2019-04-16 NOTE — Telephone Encounter (Signed)
Spoke with patient and reviewed results and recommendations. She did mention that she is waiting on Praluent paperwork and I have not seen anything come in. Reviewed that I would watch for the paperwork and would be in touch if any further information is needed. She verbalized understanding of our conversation, agreement with plan, and had no further questions at this time.

## 2019-04-29 NOTE — Telephone Encounter (Signed)
Spoke with patient and reviewed application for assistance. Confirmed some of the questions on the form and she will be by tomorrow to sign the form so we can fax in. Application above my desk in folder for pending applications.

## 2019-04-30 NOTE — Telephone Encounter (Signed)
Patient came in to sign and application faxed to assistance program.

## 2019-06-11 ENCOUNTER — Other Ambulatory Visit: Payer: Self-pay | Admitting: Cardiovascular Disease

## 2019-06-11 NOTE — Telephone Encounter (Signed)
Patient would like to know if you received any information RE: her Praluent patient assistance application. She states she has not received a determination from them yet.

## 2019-06-11 NOTE — Telephone Encounter (Signed)
Spoke with patient and reviewed that application was faxed in and that I would try to give them a call to check on the status and would then give her a call back. She verbalized understanding with no further questions at this time.

## 2019-06-11 NOTE — Telephone Encounter (Signed)
Spoke with patient who states she has only been taking one tablet of propranolol every evening. She states she has plenty of medication at this time and does not want it refilled until she calls our office for it. Pharmacy informed.

## 2019-06-11 NOTE — Telephone Encounter (Signed)
Spoke with patient and reviewed that we received letter that she was approved and in most cases they call to verify her address and date of shipment due to it being medication and refrigerated. If they receive verification then it's possible that is why she has not heard anything from them yet. Provided her with their number and hours available with instructions to call back if any further questions. She verbalized understanding and was appreciative for the call.

## 2019-06-12 DIAGNOSIS — Z961 Presence of intraocular lens: Secondary | ICD-10-CM | POA: Diagnosis not present

## 2019-06-12 DIAGNOSIS — H524 Presbyopia: Secondary | ICD-10-CM | POA: Diagnosis not present

## 2019-06-26 ENCOUNTER — Encounter: Payer: Self-pay | Admitting: Family Medicine

## 2019-06-26 ENCOUNTER — Ambulatory Visit (INDEPENDENT_AMBULATORY_CARE_PROVIDER_SITE_OTHER): Payer: Medicare Other | Admitting: Family Medicine

## 2019-06-26 ENCOUNTER — Other Ambulatory Visit: Payer: Self-pay

## 2019-06-26 VITALS — BP 140/70 | HR 76 | Temp 97.0°F | Ht 66.0 in | Wt 198.4 lb

## 2019-06-26 DIAGNOSIS — M545 Low back pain, unspecified: Secondary | ICD-10-CM

## 2019-06-26 DIAGNOSIS — R350 Frequency of micturition: Secondary | ICD-10-CM | POA: Diagnosis not present

## 2019-06-26 DIAGNOSIS — I25118 Atherosclerotic heart disease of native coronary artery with other forms of angina pectoris: Secondary | ICD-10-CM | POA: Diagnosis not present

## 2019-06-26 LAB — POCT URINALYSIS DIPSTICK
Bilirubin, UA: NEGATIVE
Blood, UA: NEGATIVE
Glucose, UA: NEGATIVE
Ketones, UA: NEGATIVE
Protein, UA: POSITIVE — AB
Spec Grav, UA: 1.015 (ref 1.010–1.025)
Urobilinogen, UA: 0.2 E.U./dL
pH, UA: 7 (ref 5.0–8.0)

## 2019-06-26 MED ORDER — CEPHALEXIN 500 MG PO CAPS: 500.0000 mg | ORAL_CAPSULE | Freq: Two times a day (BID) | ORAL | 0 refills | Status: DC

## 2019-06-26 NOTE — Patient Instructions (Signed)
We will treat you for UTI. We will contact you with your culture result.

## 2019-06-26 NOTE — Assessment & Plan Note (Signed)
Suspicious for UTI.  Will empirically treat for this with Keflex.  Sent for urine culture microscopy.

## 2019-06-26 NOTE — Progress Notes (Signed)
  Tommi Rumps, MD Phone: [phone]  YENNIFER LACHARITE is a 74 y.o. female who presents today for same day visit.   UTI: Dysuria- no  Frequency- yes   Urgency- yes   Hematuria- no   Abd pain- suprapubic pressure sensation   Vaginal d/c- no Going on for 4 days.    Social History   Tobacco Use  Smoking Status Never Smoker  Smokeless Tobacco Never Used     ROS see history of present illness  Objective  Physical Exam Vitals:   06/26/19 1528  BP: 140/70  Pulse: 76  Temp: (!) 97 F (36.1 C)  SpO2: 97%    BP Readings from Last 3 Encounters:  06/26/19 140/70  04/08/19 132/70  04/04/18 130/78   Wt Readings from Last 3 Encounters:  06/26/19 198 lb 6.4 oz (90 kg)  04/08/19 199 lb 4 oz (90.4 kg)  04/04/18 194 lb (88 kg)    Physical Exam Constitutional:      General: She is not in acute distress.    Appearance: She is not diaphoretic.  Cardiovascular:     Rate and Rhythm: Normal rate and regular rhythm.     Heart sounds: Normal heart sounds.  Pulmonary:     Effort: Pulmonary effort is normal.     Breath sounds: Normal breath sounds.  Abdominal:     General: Bowel sounds are normal. There is no distension.     Palpations: Abdomen is soft.     Tenderness: There is abdominal tenderness (mild suprapubic tenderness). There is no guarding or rebound.  Skin:    General: Skin is warm and dry.  Neurological:     Mental Status: She is alert.     Assessment/Plan: Please see individual problem list.  Urine frequency Suspicious for UTI.  Will empirically treat for this with Keflex.  Sent for urine culture microscopy.   Health Maintenance: mammogram ordered. She will call to schedule.   Orders Placed This Encounter  Procedures  . Urine Culture  . Urine Microscopic  . POCT Urinalysis Dipstick    Meds ordered this encounter  Medications  . cephALEXin (KEFLEX) 500 MG capsule    Sig: Take 1 capsule (500 mg total) by mouth 2 (two) times daily.    Dispense:  14  capsule    Refill:  0    This visit occurred during the SARS-CoV-2 public health emergency.  Safety protocols were in place, including screening questions prior to the visit, additional usage of staff PPE, and extensive cleaning of exam room while observing appropriate contact time as indicated for disinfecting solutions.    Tommi Rumps, MD [place]

## 2019-06-27 ENCOUNTER — Other Ambulatory Visit: Payer: Self-pay | Admitting: *Deleted

## 2019-06-27 DIAGNOSIS — R8290 Unspecified abnormal findings in urine: Secondary | ICD-10-CM

## 2019-06-27 LAB — URINE CULTURE
MICRO NUMBER:: 10313004
SPECIMEN QUALITY:: ADEQUATE

## 2019-06-27 LAB — URINALYSIS, MICROSCOPIC ONLY

## 2019-06-28 ENCOUNTER — Other Ambulatory Visit: Payer: Self-pay | Admitting: Cardiovascular Disease

## 2019-07-19 ENCOUNTER — Other Ambulatory Visit: Payer: Self-pay | Admitting: Cardiovascular Disease

## 2019-08-22 ENCOUNTER — Telehealth: Payer: Self-pay | Admitting: Cardiovascular Disease

## 2019-08-22 MED ORDER — PRALUENT 150 MG/ML ~~LOC~~ SOAJ: 150.0000 mg | SUBCUTANEOUS | 0 refills | Status: DC

## 2019-08-22 NOTE — Telephone Encounter (Signed)
Spoke with patient and she states that the assistance company had changed requirements. She stated that she had to meet $500 before she would qualify for assistance. Inquired if she would like me to send one script to her local pharmacy and that would take care of that requirement. She was agreeable with this and we can then see if she will be able to get discounted medication again. Instructed her to please call back if she should have any further problems or questions. She verbalized understanding with no further concerns at this time.

## 2019-08-22 NOTE — Telephone Encounter (Signed)
Patient needing to ask some questions regarding praulent injection and out of pocket costs  Please advise

## 2019-09-02 NOTE — Telephone Encounter (Signed)
Paperwork placed on [employer].

## 2019-09-02 NOTE — Telephone Encounter (Signed)
Patient dropped off paperwork for assistance forms Placed in nurse box

## 2019-09-03 ENCOUNTER — Telehealth: Payer: Self-pay | Admitting: Family Medicine

## 2019-09-03 NOTE — Telephone Encounter (Signed)
Left message for patient to call back and schedule Medicare Annual Wellness Visit (AWV) either virtually or audio only.  Last AWV 5.31.19; please schedule at anytime with Denisa O'Brien-Blaney at [facility] [facility].   

## 2019-09-03 NOTE — Telephone Encounter (Signed)
Spoke with patient and reviewed that I did receive the expense report she dropped off. Advised that I would get that faxed in today and hoping that will meet their requirements. I did review her expense report and she is paying $130.52 for her Zetia. Advised that I would mail her some coupons which will allow her to pay between $17-$19 for 90 day supply with no insurance using just [employer]. She couldn't believe the huge cost savings. Instructed her to please call back if any further questions. Faxing form over to assistance program and mailing out the coupons to her. She was appreciative for the call back.

## 2019-09-05 NOTE — Telephone Encounter (Signed)
Fax received from [employer].   They are needing proof of income.   Letter was given to [facility], RN

## 2019-09-05 NOTE — Telephone Encounter (Signed)
Left voicemail message that we received request from assistance program for proof of income and to please give me a call back when possible to review.

## 2019-09-11 NOTE — Telephone Encounter (Signed)
Spoke with patient and reviewed that they need proof of income in order to process application for assistance. Let her know that we would need copy of 1040 and then we would fax to them and all is confidential. She verbalized understanding and will try to drop this off in envelope with my name on it. Reviewed that I would watch for this and then fax to assistance program. Let her know that I would be out of the office the rest of this week but would take care of it next Monday. She was very appreciative for the call and had no further questions at this time.

## 2019-09-17 NOTE — Telephone Encounter (Signed)
Spoke with patient and she states that after finding her 1040 they realized they were outside the income limit for assistance. She was appreciative for the assistance and will definitely probably qualify for next year. Encouraged her to let us know if we can assist any further. She had no further questions at this time and appreciated the follow up with her.

## 2019-11-20 ENCOUNTER — Other Ambulatory Visit: Payer: Self-pay | Admitting: *Deleted

## 2019-11-20 MED ORDER — PRALUENT 150 MG/ML ~~LOC~~ SOAJ: 150.0000 mg | SUBCUTANEOUS | 11 refills | Status: DC

## 2019-12-27 ENCOUNTER — Ambulatory Visit: Payer: Medicare Other | Admitting: Family Medicine

## 2020-04-08 ENCOUNTER — Other Ambulatory Visit: Payer: Self-pay | Admitting: Cardiovascular Disease

## 2020-04-19 NOTE — Progress Notes (Signed)
Cardiology Office Note  Date:  04/20/2020   ID:  Dawn Herring, Dawn Herring 28-Dec-1945, MRN [id number]  PCP:  Dawn Haven, MD   Chief Complaint  Patient presents with  . Other    12 month follow up. Patient c.o runs of afib - 160 HR. Meds reviewed verbally with patient.     HPI:  Dawn Herring is a very pleasant 75 year old woman with past medical history of  hypertension,  familial hypercholesterolemia,  chest pain and atrial tachycardia, dating back >10 years with negative stress tests Prior CT coronary calcium score  190,  started on more aggressive cholesterol regiment at that time PVCs seen on prior EKGs Event monitor December 2016 showing APCs, runs of  atrial tachycardia Who presents for routine followup of her hyperlipidemia hypertension, and arrhythmia  Paroxysmal tachycardia, happening more Taking more propranolol/flecainide PRN Always seems to happen at nighttime Rates 150 bpm, 2 hours Strips reviewed,  Waves noted, atrial tach?, some irregularity Pain between shoulder blades, take tylenol/advil Happening once a month, tired the next day  Poor sleep in general, has tried various sleep aids such as melatonin and Benadryl but they do not work  on praluent, no intolerance No regular exercise program  Using her smart phone to monitor arrhythmia Strips reviewed today  EKG personally reviewed by myself on todays visit Shows sinus bradycardia rate 53 bpm T wave abnormality V3 through V6 1 and aVL  Other past medical history reviewed Cataract surgery: sept 26th, 2017 Second cataract surgery  last Thursday, anesthesia noted irregular rhythm She did not notice any symptoms Reports that she has not felt well since that time, feels weak  Off pralunt, changed to repatha,  She received samples from the office, did not the to fill her prescription despite aggressive marketing from [facility] pharmacy. Was told that they would cancel her prescription. She was paying $600 per  month and reports that she was able to afford this. Prescription now canceled? Has been without medication for several months  Previous 30 day monitor again reviewed with her showing runs of atrial tachycardia, run of SVT, APCs, PVCs  Previously had significant myalgias on the fluvastatin  Symptoms improved by holding the medication Previously had gallbladder attack  and in the setting of severe pain, had tachycardia concerning for arrhythmia  Prior episode of tachycardia concerning for arrhythmia in the setting of back pain in the past  Strong family history of familial hypercholesterolemia.  She has tried numerous cholesterol medications in the past including Crestor, Lipitor, lovastatin. She did not tolerate these secondary to side effects.    PMH:   has a past medical history of Chest pain, Essential hypertension, Hiatal hernia, History of DVT (deep vein thrombosis), Hyperlipidemia, and Palpitations.  PSH:    Past Surgical History:  Procedure Laterality Date  . CATARACT EXTRACTION, BILATERAL    . LAPAROSCOPIC APPENDECTOMY N/A 12/02/2015   Procedure: APPENDECTOMY LAPAROSCOPIC;  Surgeon: Dawn Riley, MD;  Location: [facility];  Service: General;  Laterality: N/A;  . ROOT CANAL  2013  . ROOT CANAL    . VAGINAL HYSTERECTOMY     abdominal hysterectomy    Current Outpatient Medications  Medication Sig Dispense Refill  . aspirin 81 MG tablet Take 81 mg by mouth daily.     . colchicine 0.6 MG tablet Take 1 tablet (0.6 mg total) by mouth 2 (two) times daily as needed. As needed for gout flare 30 tablet 1  . metoprolol succinate (TOPROL-XL) 25 MG 24 hr  tablet Take 1 tablet (25 mg total) by mouth daily. Take with or immediately following a meal. ok to start with 1/2 pill daily 30 tablet 3  . Alirocumab (PRALUENT) 150 MG/ML SOAJ Inject 150 mg into the skin every 14 (fourteen) days. 2 mL 11  . ezetimibe (ZETIA) 10 MG tablet Take 1 tablet (10 mg total) by mouth daily. 90 tablet 3  .  flecainide (TAMBOCOR) 50 MG tablet Take 1 tablet (50 mg total) by mouth 2 (two) times daily as needed. 60 tablet 6  . losartan-hydrochlorothiazide (HYZAAR) 100-12.5 MG tablet Take 1 tablet by mouth daily. 90 tablet 3  . omeprazole (PRILOSEC) 20 MG capsule Take 1 capsule (20 mg total) by mouth daily. 90 capsule 3  . propranolol (INDERAL) 10 MG tablet Take 1 tablet (10 mg total) by mouth 3 (three) times daily as needed. 270 tablet 3   No current facility-administered medications for this visit.     Allergies:   Statins   Social History:  The patient  reports that she has never smoked. She has never used smokeless tobacco. She reports that she does not drink alcohol and does not use drugs.   Family History:   family history includes Colon cancer in an other family member; Heart disease in her brother, father, and mother; Hyperlipidemia in her sister; Stroke in her brother; Thyroid cancer in her brother.    Review of Systems: Review of Systems  Constitutional: Negative.   HENT: Negative.   Respiratory: Negative.   Cardiovascular:       Episodes of tachycardia  Gastrointestinal: Negative.   Musculoskeletal: Negative.   Neurological: Negative.   Psychiatric/Behavioral: Negative.   All other systems reviewed and are negative.   PHYSICAL EXAM: VS:  BP 124/64 (BP Location: Left Arm, Patient Position: Sitting, Cuff Size: Normal)   Pulse (!) 53   Ht 5\' 6"  (1.676 m)   Wt 193 lb (87.5 kg)   SpO2 97%   BMI 31.15 kg/m  , BMI Body mass index is 31.15 kg/m. Constitutional:  oriented to person, place, and time. No distress.  HENT:  Head: Grossly normal Eyes:  no discharge. No scleral icterus.  Neck: No JVD, no carotid bruits  Cardiovascular: Regular rate and rhythm, no murmurs appreciated Pulmonary/Chest: Clear to auscultation bilaterally, no wheezes or rails Abdominal: Soft.  no distension.  no tenderness.  Musculoskeletal: Normal range of motion Neurological:  normal muscle tone.  Coordination normal. No atrophy Skin: Skin warm and dry Psychiatric: normal affect, pleasant  Recent Labs: No results found for requested labs within last 8760 hours.    Lipid Panel Lab Results  Component Value Date   CHOL 192 04/08/2019   HDL 46 04/08/2019   LDLCALC 69 04/08/2019   TRIG 495 (H) 04/08/2019    Wt Readings from Last 3 Encounters:  04/20/20 193 lb (87.5 kg)  06/26/19 198 lb 6.4 oz (90 kg)  04/08/19 199 lb 4 oz (90.4 kg)     ASSESSMENT AND PLAN:  Pure hypercholesterolemia - on her praluent,  Denies any significant side effects LDL close to goal  Coronary artery disease involving native coronary artery of native heart without angina pectoris - Plan: EKG 12-Lead  CT coronary calcium score 190 Continue Praluent with Zetia Statin intolerance  Cardiac arrhythmia, unspecified cardiac arrhythmia type  Long discussion concerning her arrhythmia Worsening symptoms tachycardia typically at nighttime  zio monitor has been placed Need to differentiate atrial tachycardia from atrial fibrillation After documenting tachycardia, will likely look to change  to metoprolol succinate on a daily basis at dinner plus minus flecainide Would need to use very low-dose given baseline bradycardia, heart rate today in the 50s  Essential hypertension Blood pressure is well controlled on today's visit. No changes made to the medications.    Total encounter time more than 25 minutes  Greater than 50% was spent in counseling and coordination of care with the patient      Orders Placed This Encounter  Procedures  . Flu Vaccine QUAD High Dose(Fluad)  . Comprehensive metabolic panel  . Lipid panel  . LONG TERM MONITOR (3-14 DAYS)  . EKG 12-Lead     Signed, Esmond Plants, M.D., Ph.D. 04/20/2020  [place], [place]

## 2020-04-20 ENCOUNTER — Ambulatory Visit (INDEPENDENT_AMBULATORY_CARE_PROVIDER_SITE_OTHER): Payer: Medicare Other | Admitting: Cardiovascular Disease

## 2020-04-20 ENCOUNTER — Encounter: Payer: Self-pay | Admitting: Cardiovascular Disease

## 2020-04-20 ENCOUNTER — Other Ambulatory Visit: Payer: Self-pay

## 2020-04-20 ENCOUNTER — Ambulatory Visit (INDEPENDENT_AMBULATORY_CARE_PROVIDER_SITE_OTHER): Payer: Medicare Other

## 2020-04-20 VITALS — BP 124/64 | HR 53 | Ht 66.0 in | Wt 193.0 lb

## 2020-04-20 DIAGNOSIS — E782 Mixed hyperlipidemia: Secondary | ICD-10-CM | POA: Diagnosis not present

## 2020-04-20 DIAGNOSIS — I10 Essential (primary) hypertension: Secondary | ICD-10-CM

## 2020-04-20 DIAGNOSIS — I479 Paroxysmal tachycardia, unspecified: Secondary | ICD-10-CM

## 2020-04-20 DIAGNOSIS — Z23 Encounter for immunization: Secondary | ICD-10-CM

## 2020-04-20 DIAGNOSIS — I499 Cardiac arrhythmia, unspecified: Secondary | ICD-10-CM

## 2020-04-20 DIAGNOSIS — I25118 Atherosclerotic heart disease of native coronary artery with other forms of angina pectoris: Secondary | ICD-10-CM | POA: Diagnosis not present

## 2020-04-20 DIAGNOSIS — I471 Supraventricular tachycardia: Secondary | ICD-10-CM | POA: Diagnosis not present

## 2020-04-20 DIAGNOSIS — Z789 Other specified health status: Secondary | ICD-10-CM | POA: Diagnosis not present

## 2020-04-20 MED ORDER — EZETIMIBE 10 MG PO TABS
10.0000 mg | ORAL_TABLET | Freq: Every day | ORAL | 3 refills | Status: DC
Start: 2020-04-20 — End: 2020-12-18

## 2020-04-20 MED ORDER — PRALUENT 150 MG/ML ~~LOC~~ SOAJ: 150.0000 mg | SUBCUTANEOUS | 11 refills | Status: DC

## 2020-04-20 MED ORDER — PROPRANOLOL HCL 10 MG PO TABS
10.0000 mg | ORAL_TABLET | Freq: Three times a day (TID) | ORAL | 3 refills | Status: DC | PRN
Start: 2020-04-20 — End: 2020-10-27

## 2020-04-20 MED ORDER — OMEPRAZOLE 20 MG PO CPDR
20.0000 mg | DELAYED_RELEASE_CAPSULE | Freq: Every day | ORAL | 3 refills | Status: DC
Start: 2020-04-20 — End: 2021-04-19

## 2020-04-20 MED ORDER — LOSARTAN POTASSIUM-HCTZ 100-12.5 MG PO TABS: 1.0000 | ORAL_TABLET | Freq: Every day | ORAL | 3 refills | Status: DC

## 2020-04-20 MED ORDER — METOPROLOL SUCCINATE ER 25 MG PO TB24: 25.0000 mg | ORAL_TABLET | Freq: Every day | ORAL | 3 refills | Status: DC

## 2020-04-20 MED ORDER — FLECAINIDE ACETATE 50 MG PO TABS
50.0000 mg | ORAL_TABLET | Freq: Two times a day (BID) | ORAL | 6 refills | Status: DC | PRN
Start: 2020-04-20 — End: 2020-10-26

## 2020-04-20 NOTE — Patient Instructions (Addendum)
You was given your yearly Flu shot today  Medication Instructions:  Start metoprolol succinate 12.5 mg up to 25 mg daily in the PM  All your cardiac medications have been updated for 90 day supply for one year, please call your pharmacy when refills are needed, they may be picked up today if needed.  Lab work: CMP and lipids  Your labs (blood work) drawn today and your tests are completely normal, you will receive your results only by: Marland Kitchen MyChart Message (if you have MyChart) OR . A paper copy in the mail If you have any lab test that is abnormal or we need to change your treatment, we will call you to review the results.   Testing/Procedures: Zio monitor for paroxysmal tachycardia Heart monitor (Zio patch) for 2 weeks (14 days)   Your physician has recommended that you wear a Zio monitor. This monitor is a medical device that records the heart's electrical activity. Doctors most often use these monitors to diagnose arrhythmias. Arrhythmias are problems with the speed or rhythm of the heartbeat. The monitor is a small device applied to your chest. You can wear one while you do your normal daily activities. While wearing this monitor if you have any symptoms to push the button and record what you felt. Once you have worn this monitor for the period of time provider prescribed (Usually 14 days), you will return the monitor device in the postage paid box. Once it is returned they will download the data collected and provide [place] with a report which the provider will then review and we will call you with those results. Important tips:  1. Avoid showering during the first 24 hours of wearing the monitor. 2. Avoid excessive sweating to help maximize wear time. 3. Do not submerge the device, no hot tubs, and no swimming pools. 4. Keep any lotions or oils away from the patch. 5. After 24 hours you may shower with the patch on. Take brief showers with your back facing the shower head.  6. Do not  remove patch once it has been placed because that will interrupt data and decrease adhesive wear time. 7. Push the button when you have any symptoms and write down what you were feeling. 8. Once you have completed wearing your monitor, remove and place into box which has postage paid and place in your outgoing mailbox.  9. If for some reason you have misplaced your box then call our office and we can provide another box and/or mail it off for you.        Follow-Up: At [facility], you and your health needs are our priority.  As part of our continuing mission to provide you with exceptional heart care, we have created designated Provider Care Teams.  These Care Teams include your primary Cardiologist (physician) and Advanced Practice Providers (APPs -  Physician Assistants and Nurse Practitioners) who all work together to provide you with the care you need, when you need it.  . You will need a follow up appointment in 6 months  . Providers on your designated Care Team:   . Murray Hodgkins, NP . Christell Faith, PA-C . Marrianne Mood, PA-C  Any Other Special Instructions Will Be Listed Below (If Applicable).  COVID-19 Vaccine Information can be found at: ShippingScam.co.uk For questions related to vaccine distribution or appointments, please email [email or web address] or call [phone].   Influenza (Flu) Vaccine (Inactivated or Recombinant): What You Need to Know 1. Why get vaccinated? Influenza vaccine can prevent  influenza (flu). Flu is a contagious disease that spreads around the [place] every year, usually between October and May. Anyone can get the flu, but it is more dangerous for some people. Infants and young children, people 47 years and older, pregnant people, and people with certain health conditions or a weakened immune system are at greatest risk of flu complications. Pneumonia, bronchitis, sinus  infections, and ear infections are examples of flu-related complications. If you have a medical condition, such as heart disease, cancer, or diabetes, flu can make it worse. Flu can cause fever and chills, sore throat, muscle aches, fatigue, cough, headache, and runny or stuffy nose. Some people may have vomiting and diarrhea, though this is more common in children than adults. In an average year, thousands of people in the [place] die from flu, and many more are hospitalized. Flu vaccine prevents millions of illnesses and flu-related visits to the doctor each year. 2. Influenza vaccines CDC recommends everyone 6 months and older get vaccinated every flu season. Children 6 months through 69 years of age may need 2 doses during a single flu season. Everyone else needs only 1 dose each flu season. It takes about 2 weeks for protection to develop after vaccination. There are many flu viruses, and they are always changing. Each year a new flu vaccine is made to protect against the influenza viruses believed to be likely to cause disease in the upcoming flu season. Even when the vaccine doesn't exactly match these viruses, it may still provide some protection. Influenza vaccine does not cause flu. Influenza vaccine may be given at the same time as other vaccines. 3. Talk with your health care provider Tell your vaccination provider if the person getting the vaccine:  Has had an allergic reaction after a previous dose of influenza vaccine, or has any severe, life-threatening allergies  Has ever had Guillain-Barr Syndrome (also called "GBS") In some cases, your health care provider may decide to postpone influenza vaccination until a future visit. Influenza vaccine can be administered at any time during pregnancy. People who are or will be pregnant during influenza season should receive inactivated influenza vaccine. People with minor illnesses, such as a cold, may be vaccinated. People who are  moderately or severely ill should usually wait until they recover before getting influenza vaccine. Your health care provider can give you more information. 4. Risks of a vaccine reaction  Soreness, redness, and swelling where the shot is given, fever, muscle aches, and headache can happen after influenza vaccination.  There may be a very small increased risk of Guillain-Barr Syndrome (GBS) after inactivated influenza vaccine (the flu shot). Young children who get the flu shot along with pneumococcal vaccine (PCV13) and/or DTaP vaccine at the same time might be slightly more likely to have a seizure caused by fever. Tell your health care provider if a child who is getting flu vaccine has ever had a seizure. People sometimes faint after medical procedures, including vaccination. Tell your provider if you feel dizzy or have vision changes or ringing in the ears. As with any medicine, there is a very remote chance of a vaccine causing a severe allergic reaction, other serious injury, or death. 5. What if there is a serious problem? An allergic reaction could occur after the vaccinated person leaves the clinic. If you see signs of a severe allergic reaction (hives, swelling of the face and throat, difficulty breathing, a fast heartbeat, dizziness, or weakness), call 9-1-1 and get the person to the  nearest hospital. For other signs that concern you, call your health care provider. Adverse reactions should be reported to the Vaccine Adverse Event Reporting System (VAERS). Your health care provider will usually file this report, or you can do it yourself. Visit the VAERS website at www.vaers.SamedayNews.es or call 615 817 8382. VAERS is only for reporting reactions, and VAERS staff members do not give medical advice. 6. The National Vaccine Injury Compensation Program The [employer] Vaccine Injury Compensation Program (VICP) is a federal program that was created to compensate people who may have been injured by  certain vaccines. Claims regarding alleged injury or death due to vaccination have a time limit for filing, which may be as short as two years. Visit the VICP website at GoldCloset.com.ee or call 279-332-4724 to learn about the program and about filing a claim. 7. How can I learn more?  Ask your health care provider.  Call your local or state health department.  Visit the website of the Food and Drug Administration (FDA) for vaccine package inserts and additional information at TraderRating.uy.  Contact the Centers for Disease Control and Prevention (CDC): ? Call (805)053-3482 (1-800-CDC-INFO) or ? Visit CDC's website at https://gibson.com/. Vaccine Information Statement Inactivated Influenza Vaccine (11/01/2019) This information is not intended to replace advice given to you by your health care provider. Make sure you discuss any questions you have with your health care provider. Document Revised: 12/19/2019 Document Reviewed: 12/19/2019 Elsevier Patient Education  2021 [employer].

## 2020-04-21 LAB — COMPREHENSIVE METABOLIC PANEL
ALT: 15 IU/L (ref 0–32)
AST: 23 IU/L (ref 0–40)
Albumin/Globulin Ratio: 1.4 (ref 1.2–2.2)
Albumin: 4.3 g/dL (ref 3.7–4.7)
Alkaline Phosphatase: 69 IU/L (ref 44–121)
BUN/Creatinine Ratio: 16 (ref 12–28)
BUN: 15 mg/dL (ref 8–27)
Bilirubin Total: 0.5 mg/dL (ref 0.0–1.2)
CO2: 25 mmol/L (ref 20–29)
Calcium: 9.9 mg/dL (ref 8.7–10.3)
Chloride: 100 mmol/L (ref 96–106)
Creatinine, Ser: 0.96 mg/dL (ref 0.57–1.00)
GFR calc Af Amer: 67 mL/min/{1.73_m2} (ref >59)
GFR calc non Af Amer: 58 mL/min/{1.73_m2} — ABNORMAL LOW (ref >59)
Globulin, Total: 3.1 g/dL (ref 1.5–4.5)
Glucose: 110 mg/dL — ABNORMAL HIGH (ref 65–99)
Potassium: 4.2 mmol/L (ref 3.5–5.2)
Sodium: 142 mmol/L (ref 134–144)
Total Protein: 7.4 g/dL (ref 6.0–8.5)

## 2020-04-21 LAB — LIPID PANEL
Chol/HDL Ratio: 4.2 ratio (ref 0.0–4.4)
Cholesterol, Total: 204 mg/dL — ABNORMAL HIGH (ref 100–199)
HDL: 49 mg/dL (ref >39)
LDL Chol Calc (NIH): 83 mg/dL (ref 0–99)
Triglycerides: 451 mg/dL — ABNORMAL HIGH (ref 0–149)
VLDL Cholesterol Cal: 72 mg/dL — ABNORMAL HIGH (ref 5–40)

## 2020-04-24 ENCOUNTER — Ambulatory Visit (INDEPENDENT_AMBULATORY_CARE_PROVIDER_SITE_OTHER): Payer: Medicare Other

## 2020-04-24 VITALS — Ht 66.0 in | Wt 193.0 lb

## 2020-04-24 DIAGNOSIS — Z0000 Encounter for general adult medical examination without abnormal findings: Secondary | ICD-10-CM

## 2020-04-24 DIAGNOSIS — Z780 Asymptomatic menopausal state: Secondary | ICD-10-CM | POA: Diagnosis not present

## 2020-04-24 NOTE — Patient Instructions (Addendum)
Dawn Herring , Thank you for taking time to come for your Medicare Wellness Visit. I appreciate your ongoing commitment to your health goals. Please review the following plan we discussed and let me know if I can assist you in the future.   These are the goals we discussed: Goals    . I would like to lose weight     Stay active Healthy diet     . Low carb foods       This is a list of the screening recommended for you and due dates:  Health Maintenance  Topic Date Due  . Tetanus Vaccine  Never done  . DEXA scan (bone density measurement)  Never done  . Pneumonia vaccines (2 of 2 - PPSV23) 07/14/2017  . Cologuard (Stool DNA test)  08/01/2020  . Flu Shot  Completed  . COVID-19 Vaccine  Completed  .  Hepatitis C: One time screening is recommended by Center for Disease Control  (CDC) for  adults born from [birth year] through 1966.   Completed    Immunizations Immunization History  Administered Date(s) Administered  . Fluad Quad(high Dose 65+) 04/08/2019, 04/20/2020  . Hepatitis B, adult 03/17/2015, 04/21/2015, 09/15/2015  . Influenza, High Dose Seasonal PF 01/13/2017, 01/17/2018  . Influenza,inj,Quad PF,6+ Mos 03/03/2015, 02/17/2016  . Moderna Sars-Covid-2 Vaccination 05/09/2019, 06/03/2019  . Pneumococcal Conjugate-13 07/14/2016   Keep all routine maintenance appointments.   Follow up 04/29/20 @ 11:00  Advanced directives: End of life planning; Advance aging; Advanced directives discussed.  Copy of current HCPOA/Living Will requested.    Conditions/risks identified: none new  Follow up in one year for your annual wellness visit.   Preventive Care 38 Years and Older, Female Preventive care refers to lifestyle choices and visits with your health care provider that can promote health and wellness. What does preventive care include?  A yearly physical exam. This is also called an annual well check.  Dental exams once or twice a year.  Routine eye exams. Ask your health care  provider how often you should have your eyes checked.  Personal lifestyle choices, including:  Daily care of your teeth and gums.  Regular physical activity.  Eating a healthy diet.  Avoiding tobacco and drug use.  Limiting alcohol use.  Practicing safe sex.  Taking low-dose aspirin every day.  Taking vitamin and mineral supplements as recommended by your health care provider. What happens during an annual well check? The services and screenings done by your health care provider during your annual well check will depend on your age, overall health, lifestyle risk factors, and family history of disease. Counseling  Your health care provider may ask you questions about your:  Alcohol use.  Tobacco use.  Drug use.  Emotional well-being.  Home and relationship well-being.  Sexual activity.  Eating habits.  History of falls.  Memory and ability to understand (cognition).  Work and work Statistician.  Reproductive health. Screening  You may have the following tests or measurements:  Height, weight, and BMI.  Blood pressure.  Lipid and cholesterol levels. These may be checked every 5 years, or more frequently if you are over 77 years old.  Skin check.  Lung cancer screening. You may have this screening every year starting at age 22 if you have a 30-pack-year history of smoking and currently smoke or have quit within the past 15 years.  Fecal occult blood test (FOBT) of the stool. You may have this test every year starting at age 64.  Flexible sigmoidoscopy or colonoscopy. You may have a sigmoidoscopy every 5 years or a colonoscopy every 10 years starting at age 79.  Hepatitis C blood test.  Hepatitis B blood test.  Sexually transmitted disease (STD) testing.  Diabetes screening. This is done by checking your blood sugar (glucose) after you have not eaten for a while (fasting). You may have this done every 1-3 years.  Bone density scan. This is done to  screen for osteoporosis. You may have this done starting at age 57.  Mammogram. This may be done every 1-2 years. Talk to your health care provider about how often you should have regular mammograms. Talk with your health care provider about your test results, treatment options, and if necessary, the need for more tests. Vaccines  Your health care provider may recommend certain vaccines, such as:  Influenza vaccine. This is recommended every year.  Tetanus, diphtheria, and acellular pertussis (Tdap, Td) vaccine. You may need a Td booster every 10 years.  Zoster vaccine. You may need this after age 22.  Pneumococcal 13-valent conjugate (PCV13) vaccine. One dose is recommended after age 5.  Pneumococcal polysaccharide (PPSV23) vaccine. One dose is recommended after age 58. Talk to your health care provider about which screenings and vaccines you need and how often you need them. This information is not intended to replace advice given to you by your health care provider. Make sure you discuss any questions you have with your health care provider. Document Released: 04/10/2015 Document Revised: 12/02/2015 Document Reviewed: 01/13/2015 Elsevier Interactive Patient Education  2017 [place] Prevention in the Home Falls can cause injuries. They can happen to people of all ages. There are many things you can do to make your home safe and to help prevent falls. What can I do on the outside of my home?  Regularly fix the edges of walkways and driveways and fix any cracks.  Remove anything that might make you trip as you walk through a door, such as a raised step or threshold.  Trim any bushes or trees on the path to your home.  Use bright outdoor lighting.  Clear any walking paths of anything that might make someone trip, such as rocks or tools.  Regularly check to see if handrails are loose or broken. Make sure that both sides of any steps have handrails.  Any raised decks  and porches should have guardrails on the edges.  Have any leaves, snow, or ice cleared regularly.  Use sand or salt on walking paths during winter.  Clean up any spills in your garage right away. This includes oil or grease spills. What can I do in the bathroom?  Use night lights.  Install grab bars by the toilet and in the tub and shower. Do not use towel bars as grab bars.  Use non-skid mats or decals in the tub or shower.  If you need to sit down in the shower, use a plastic, non-slip stool.  Keep the floor dry. Clean up any water that spills on the floor as soon as it happens.  Remove soap buildup in the tub or shower regularly.  Attach bath mats securely with double-sided non-slip rug tape.  Do not have throw rugs and other things on the floor that can make you trip. What can I do in the bedroom?  Use night lights.  Make sure that you have a light by your bed that is easy to reach.  Do not use any sheets or blankets  that are too big for your bed. They should not hang down onto the floor.  Have a firm chair that has side arms. You can use this for support while you get dressed.  Do not have throw rugs and other things on the floor that can make you trip. What can I do in the kitchen?  Clean up any spills right away.  Avoid walking on wet floors.  Keep items that you use a lot in easy-to-reach places.  If you need to reach something above you, use a strong step stool that has a grab bar.  Keep electrical cords out of the way.  Do not use floor polish or wax that makes floors slippery. If you must use wax, use non-skid floor wax.  Do not have throw rugs and other things on the floor that can make you trip. What can I do with my stairs?  Do not leave any items on the stairs.  Make sure that there are handrails on both sides of the stairs and use them. Fix handrails that are broken or loose. Make sure that handrails are as long as the stairways.  Check any  carpeting to make sure that it is firmly attached to the stairs. Fix any carpet that is loose or worn.  Avoid having throw rugs at the top or bottom of the stairs. If you do have throw rugs, attach them to the floor with carpet tape.  Make sure that you have a light switch at the top of the stairs and the bottom of the stairs. If you do not have them, ask someone to add them for you. What else can I do to help prevent falls?  Wear shoes that:  Do not have high heels.  Have rubber bottoms.  Are comfortable and fit you well.  Are closed at the toe. Do not wear sandals.  If you use a stepladder:  Make sure that it is fully opened. Do not climb a closed stepladder.  Make sure that both sides of the stepladder are locked into place.  Ask someone to hold it for you, if possible.  Clearly mark and make sure that you can see:  Any grab bars or handrails.  First and last steps.  Where the edge of each step is.  Use tools that help you move around (mobility aids) if they are needed. These include:  Canes.  Walkers.  Scooters.  Crutches.  Turn on the lights when you go into a dark area. Replace any light bulbs as soon as they burn out.  Set up your furniture so you have a clear path. Avoid moving your furniture around.  If any of your floors are uneven, fix them.  If there are any pets around you, be aware of where they are.  Review your medicines with your doctor. Some medicines can make you feel dizzy. This can increase your chance of falling. Ask your doctor what other things that you can do to help prevent falls. This information is not intended to replace advice given to you by your health care provider. Make sure you discuss any questions you have with your health care provider. Document Released: 01/08/2009 Document Revised: 08/20/2015 Document Reviewed: 04/18/2014 Elsevier Interactive Patient Education  2017 [employer]  Bone Density Test A bone density test  uses a type of X-ray to measure the amount of calcium and other minerals in a person's bones. It can measure bone density in the hip and the spine. The test  is similar to having a regular X-ray. This test may also be called:  Bone densitometry.  Bone mineral density test.  Dual-energy X-ray absorptiometry (DEXA). You may have this test to:  Diagnose a condition that causes weak or thin bones (osteoporosis).  Screen you for osteoporosis.  Predict your risk for a broken bone (fracture).  Determine how well your osteoporosis treatment is working. Tell a health care provider about:  Any allergies you have.  All medicines you are taking, including vitamins, herbs, eye drops, creams, and over-the-counter medicines.  Any problems you or family members have had with anesthetic medicines.  Any blood disorders you have.  Any surgeries you have had.  Any medical conditions you have.  Whether you are pregnant or may be pregnant.  Any medical tests you have had within the past 14 days that used contrast material. What are the risks? Generally, this is a safe test. However, it does expose you to a small amount of radiation, which can slightly increase your cancer risk. What happens before the test?  Do not take any calcium supplements within the 24 hours before your test.  You will need to remove all metal jewelry, eyeglasses, removable dental appliances, and any other metal objects on your body. What happens during the test?  You will lie down on an exam table. There will be an X-ray generator below you and an imaging device above you.  Other devices, such as boxes or braces, may be used to position your body properly for the scan.  The machine will slowly scan your body. You will need to keep very still while the machine does the scan.  The images will show up on a screen in the room. Images will be examined by a specialist after your test is finished. The procedure may vary  among health care providers and hospitals.   What can I expect after the test? It is up to you to get the results of your test. Ask your health care provider, or the department that is doing the test, when your results will be ready. Summary  A bone density test is an imaging test that uses a type of X-ray to measure the amount of calcium and other minerals in your bones.  The test may be used to diagnose or screen you for a condition that causes weak or thin bones (osteoporosis), predict your risk for a broken bone (fracture), or determine how well your osteoporosis treatment is working.  Do not take any calcium supplements within 24 hours before your test.  Ask your health care provider, or the department that is doing the test, when your results will be ready. This information is not intended to replace advice given to you by your health care provider. Make sure you discuss any questions you have with your health care provider. Document Revised: 08/29/2019 Document Reviewed: 08/29/2019 Elsevier Patient Education  [place].

## 2020-04-24 NOTE — Progress Notes (Addendum)
Subjective:   Dawn Herring is a 75 y.o. female who presents for Medicare Annual (Subsequent) preventive examination.  Review of Systems    No ROS.  Medicare Wellness Virtual Visit.   Cardiac Risk Factors include: advanced age (>7men, >22 women);hypertension     Objective:    Today's Vitals   04/24/20 1136  Weight: 193 lb (87.5 kg)  Height: 5\' 6"  (1.676 m)   Body mass index is 31.15 kg/m.  Advanced Directives 04/24/2020 02/07/2018 08/25/2017 08/24/2016 12/02/2015  Does Patient Have a Medical Advance Directive? Yes Yes Yes Yes No  Type of Paramedic of [place];Living will [place];Living will;Out of facility DNR (pink MOST or yellow form) [place];Living will [place];Living will -  Does patient want to make changes to medical advance directive? No - Patient declined No - Patient declined No - Patient declined No - Patient declined -  Copy of [place] in Chart? No - copy requested - No - copy requested No - copy requested -  Would patient like information on creating a medical advance directive? - - - - No - patient declined information    Current Medications (verified) Outpatient Encounter Medications as of 04/24/2020  Medication Sig  . Alirocumab (PRALUENT) 150 MG/ML SOAJ Inject 150 mg into the skin every 14 (fourteen) days.  Marland Kitchen aspirin 81 MG tablet Take 81 mg by mouth daily.   . colchicine 0.6 MG tablet Take 1 tablet (0.6 mg total) by mouth 2 (two) times daily as needed. As needed for gout flare  . ezetimibe (ZETIA) 10 MG tablet Take 1 tablet (10 mg total) by mouth daily.  . flecainide (TAMBOCOR) 50 MG tablet Take 1 tablet (50 mg total) by mouth 2 (two) times daily as needed.  Marland Kitchen losartan-hydrochlorothiazide (HYZAAR) 100-12.5 MG tablet Take 1 tablet by mouth daily.  . metoprolol succinate (TOPROL-XL) 25 MG 24 hr tablet Take 1 tablet (25 mg total) by mouth daily. Take with or  immediately following a meal. ok to start with 1/2 pill daily (Patient not taking: Reported on 04/24/2020)  . omeprazole (PRILOSEC) 20 MG capsule Take 1 capsule (20 mg total) by mouth daily.  . propranolol (INDERAL) 10 MG tablet Take 1 tablet (10 mg total) by mouth 3 (three) times daily as needed.   No facility-administered encounter medications on file as of 04/24/2020.    Allergies (verified) Statins   History: Past Medical History:  Diagnosis Date  . Chest pain    a. Neg stress tests - 2005, 2008, 2009;  b. 08/2014 Cardiac CT: Ca score 193 (80th%'ile).  . Essential hypertension   . Hiatal hernia    a. 08/2014 CT chest: large hiatal hernia.  Marland Kitchen History of DVT (deep vein thrombosis)   . Hyperlipidemia    a. statin intolerant;  b. on zetia and repatha.  . Palpitations    a. 02/2015 Event Monitor: NSR w/ rare, short episodes of A Tach, rare short runs of SVT < 15 beats.   Past Surgical History:  Procedure Laterality Date  . CATARACT EXTRACTION, BILATERAL    . LAPAROSCOPIC APPENDECTOMY N/A 12/02/2015   Procedure: APPENDECTOMY LAPAROSCOPIC;  Surgeon: Clovis Riley, MD;  Location: [facility];  Service: General;  Laterality: N/A;  . ROOT CANAL  2013  . ROOT CANAL    . VAGINAL HYSTERECTOMY     abdominal hysterectomy   Family History  Problem Relation Age of Onset  . Heart disease  Mother   . Heart disease Father   . Colon cancer Other        Grandparent  . Thyroid cancer Brother   . Heart disease Brother   . Stroke Brother   . Hyperlipidemia Sister    Social History   Socioeconomic History  . Marital status: Married    Spouse name: Not on file  . Number of children: Not on file  . Years of education: Not on file  . Highest education level: Not on file  Occupational History  . Not on file  Tobacco Use  . Smoking status: Never Smoker  . Smokeless tobacco: Never Used  Vaping Use  . Vaping Use: Never used  Substance and Sexual Activity  . Alcohol use: No  . Drug use: No  .  Sexual activity: Yes  Other Topics Concern  . Not on file  Social History Narrative  . Not on file   Social Determinants of Health   Financial Resource Strain: Low Risk   . Difficulty of Paying Living Expenses: Not hard at all  Food Insecurity: No Food Insecurity  . Worried About Programme researcher, broadcasting/film/videounning Out of Food in the Last Year: Never true  . Ran Out of Food in the Last Year: Never true  Transportation Needs: No Transportation Needs  . Lack of Transportation (Medical): No  . Lack of Transportation (Non-Medical): No  Physical Activity: Not on file  Stress: No Stress Concern Present  . Feeling of Stress : Not at all  Social Connections: Unknown  . Frequency of Communication with Friends and Family: Not on file  . Frequency of Social Gatherings with Friends and Family: Not on file  . Attends Religious Services: Not on file  . Active Member of Clubs or Organizations: Not on file  . Attends BankerClub or Organization Meetings: Not on file  . Marital Status: Married    Tobacco Counseling Counseling given: Not Answered   Clinical Intake:  Pre-visit preparation completed: Yes        Diabetes: No  How often do you need to have someone help you when you read instructions, pamphlets, or other written materials from your doctor or pharmacy?: 1 - Never   Interpreter Needed?: No      Activities of Daily Living In your present state of health, do you have any difficulty performing the following activities: 04/24/2020  Hearing? N  Vision? N  Difficulty concentrating or making decisions? N  Walking or climbing stairs? N  Dressing or bathing? N  Doing errands, shopping? N  Preparing Food and eating ? N  Using the Toilet? N  In the past six months, have you accidently leaked urine? N  Do you have problems with loss of bowel control? N  Managing your Medications? N  Managing your Finances? N  Housekeeping or managing your Housekeeping? N  Some recent data might be hidden    Patient Care  Team: Glori LuisSonnenberg, Eric G, MD as PCP - General (Family Medicine) Mariah MillingGollan, Tollie Pizzaimothy J, MD as Consulting Physician (Cardiology)  Indicate any recent Medical Services you may have received from other than [facility] providers in the past year (date may be approximate).     Assessment:   This is a routine wellness examination for [facility].  I connected with Corrie DandyMary today by telephone and verified that I am speaking with the correct person using two identifiers. Location patient: home Location provider: work Persons participating in the virtual visit: patient, Engineer, civil (consulting)nurse.    I discussed the limitations, risks, security  and privacy concerns of performing an evaluation and management service by telephone and the availability of in person appointments. The patient expressed understanding and verbally consented to this telephonic visit.    Interactive audio and video telecommunications were attempted between this provider and patient, however failed, due to patient having technical difficulties OR patient did not have access to video capability.  We continued and completed visit with audio only.  Some vital signs may be absent or patient reported.   Hearing/Vision screen  Hearing Screening   125Hz  250Hz  500Hz  1000Hz  2000Hz  3000Hz  4000Hz  6000Hz  8000Hz   Right ear:           Left ear:           Comments: Patient is able to hear conversational tones without difficulty.  No issues reported.  Vision Screening Comments: Wears corrective lenses Visual acuity not assessed, virtual visit.     Dietary issues and exercise activities discussed: Current Exercise Habits: Home exercise routine  Healthy diet Good water intake  Goals    . I would like to lose weight     Stay active Healthy diet     . Low carb foods      Depression Screen PHQ 2/9 Scores 04/24/2020 06/26/2019 08/25/2017 08/24/2016 07/08/2016 01/28/2015  PHQ - 2 Score 0 0 0 0 0 0    Fall Risk Fall Risk  04/24/2020 06/26/2019 10/25/2018 08/25/2017 08/24/2016   Falls in the past year? 0 0 (No Data) No No  Comment - - Emmi Telephone Survey: data to providers prior to load - -  Number falls in past yr: 0 0 (No Data) - -  Comment - - Emmi Telephone Survey Actual Response =  - -  Injury with Fall? 0 - - - -  Follow up Falls evaluation completed Falls evaluation completed - - -    FALL RISK PREVENTION PERTAINING TO THE HOME: Handrails in use when climbing stairs?Yes Home free of loose throw rugs in walkways, pet beds, electrical cords, etc? Yes  Adequate lighting in your home to reduce risk of falls? Yes   ASSISTIVE DEVICES UTILIZED TO PREVENT FALLS: Use of a cane, walker or w/c? No  Grab bars in the bathroom? No  Shower chair or bench in shower? No  Elevated toilet seat or a handicapped toilet? No   TIMED UP AND GO: Was the test performed? No . Virtual visit.   Cognitive Function:  Patient is alert and oriented x3.  Denies difficulty focusing, making decisions, memory loss.  Enjoys reading. MMSE/6CIT deferred. Normal by communication/observation.    6CIT Screen 08/25/2017 08/24/2016  What Year? 0 points 0 points  What month? 0 points 0 points  What time? 0 points 0 points  Count back from 20 0 points 0 points  Months in reverse 0 points 0 points  Repeat phrase 0 points 0 points  Total Score 0 0    Immunizations Immunization History  Administered Date(s) Administered  . Fluad Quad(high Dose 65+) 04/08/2019, 04/20/2020  . Hepatitis B, adult 03/17/2015, 04/21/2015, 09/15/2015  . Influenza, High Dose Seasonal PF 01/13/2017, 01/17/2018  . Influenza,inj,Quad PF,6+ Mos 03/03/2015, 02/17/2016  . Moderna Sars-Covid-2 Vaccination 05/09/2019, 06/03/2019, 01/24/2020  . Pneumococcal Conjugate-13 07/14/2016    TDAP status: Due, Education has been provided regarding the importance of this vaccine. Advised may receive this vaccine at local pharmacy or Health Dept. Aware to provide a copy of the vaccination record if obtained from local pharmacy  or Health Dept. Verbalized acceptance and understanding. Deferred.  Health Maintenance Health Maintenance  Topic Date Due  . TETANUS/TDAP  Never done  . DEXA SCAN  Never done  . PNA vac Low Risk Adult (2 of 2 - PPSV23) 07/14/2017  . Fecal DNA (Cologuard)  08/01/2020  . INFLUENZA VACCINE  Completed  . COVID-19 Vaccine  Completed  . Hepatitis C Screening  Completed   Colorectal cancer screening: Type of screening: Cologuard. Completed 08/01/17. Repeat every 3 years.  Bone density- consent given to order.   Mammogram- UTD.   Lung Cancer Screening: (Low Dose CT Chest recommended if Age 49-80 years, 30 pack-year currently smoking OR have quit w/in 15years.) does not qualify.   Hepatitis C Screening: Completed 03/03/15.  Vision Screening: Recommended annual ophthalmology exams for early detection of glaucoma and other disorders of the eye. Is the patient up to date with their annual eye exam?  Yes   Dental Screening: Recommended annual dental exams for proper oral hygiene.  Community Resource Referral / Chronic Care Management: CRR required this visit?  No   CCM required this visit?  No      Plan:   Keep all routine maintenance appointments.   Follow up 04/29/20 @ 11:00  I have personally reviewed and noted the following in the patient's chart:   . Medical and social history . Use of alcohol, tobacco or illicit drugs  . Current medications and supplements . Functional ability and status . Nutritional status . Physical activity . Advanced directives . List of other physicians . Hospitalizations, surgeries, and ER visits in previous 12 months . Vitals . Screenings to include cognitive, depression, and falls . Referrals and appointments  In addition, I have reviewed and discussed with patient certain preventive protocols, quality metrics, and best practice recommendations. A written personalized care plan for preventive services as well as general preventive health  recommendations were provided to patient via mychart.     Varney Biles, LPN   8/84/1660

## 2020-04-27 ENCOUNTER — Other Ambulatory Visit: Payer: Self-pay

## 2020-04-29 ENCOUNTER — Encounter: Payer: Self-pay | Admitting: Family Medicine

## 2020-04-29 ENCOUNTER — Ambulatory Visit (INDEPENDENT_AMBULATORY_CARE_PROVIDER_SITE_OTHER): Payer: Medicare Other | Admitting: Family Medicine

## 2020-04-29 ENCOUNTER — Other Ambulatory Visit: Payer: Self-pay

## 2020-04-29 VITALS — BP 120/80 | HR 69 | Temp 97.0°F | Ht 66.0 in | Wt 192.1 lb

## 2020-04-29 DIAGNOSIS — G629 Polyneuropathy, unspecified: Secondary | ICD-10-CM

## 2020-04-29 DIAGNOSIS — R7303 Prediabetes: Secondary | ICD-10-CM | POA: Diagnosis not present

## 2020-04-29 DIAGNOSIS — I2510 Atherosclerotic heart disease of native coronary artery without angina pectoris: Secondary | ICD-10-CM

## 2020-04-29 DIAGNOSIS — E782 Mixed hyperlipidemia: Secondary | ICD-10-CM | POA: Diagnosis not present

## 2020-04-29 DIAGNOSIS — Z23 Encounter for immunization: Secondary | ICD-10-CM

## 2020-04-29 DIAGNOSIS — N183 Chronic kidney disease, stage 3 unspecified: Secondary | ICD-10-CM | POA: Insufficient documentation

## 2020-04-29 DIAGNOSIS — I499 Cardiac arrhythmia, unspecified: Secondary | ICD-10-CM | POA: Diagnosis not present

## 2020-04-29 DIAGNOSIS — R0609 Other forms of dyspnea: Secondary | ICD-10-CM

## 2020-04-29 DIAGNOSIS — E1122 Type 2 diabetes mellitus with diabetic chronic kidney disease: Secondary | ICD-10-CM | POA: Insufficient documentation

## 2020-04-29 DIAGNOSIS — I10 Essential (primary) hypertension: Secondary | ICD-10-CM | POA: Diagnosis not present

## 2020-04-29 DIAGNOSIS — R0600 Dyspnea, unspecified: Secondary | ICD-10-CM

## 2020-04-29 LAB — POCT GLYCOSYLATED HEMOGLOBIN (HGB A1C): Hemoglobin A1C: 5.6 % (ref 4.0–5.6)

## 2020-04-29 MED ORDER — TETANUS-DIPHTH-ACELL PERTUSSIS 5-2.5-18.5 LF-MCG/0.5 IM SUSP
0.5000 mL | Freq: Once | INTRAMUSCULAR | 0 refills | Status: AC
Start: 2020-04-29 — End: 2020-04-29

## 2020-04-29 NOTE — Patient Instructions (Signed)
Nice to see you. Please go to the pharmacy to get your tetanus vaccine. Please call [phone] to schedule your bone density scan.

## 2020-04-29 NOTE — Assessment & Plan Note (Signed)
Check A1c. 

## 2020-04-29 NOTE — Assessment & Plan Note (Signed)
Continue risk factor management.  She will continue to see cardiology as well.

## 2020-04-29 NOTE — Assessment & Plan Note (Signed)
She currently has a ZIO monitor on.  She will complete this for cardiology.  Her tachycardia could be contributing to her breathing issues.  Treatment per cardiology.

## 2020-04-29 NOTE — Assessment & Plan Note (Signed)
Chronic.  Stable.  She declines medication.  She will let us know if she would like proceed with medication management in the future.

## 2020-04-29 NOTE — Assessment & Plan Note (Signed)
Adequate control.  Continue losartan/HCTZ 1 tablet daily.

## 2020-04-29 NOTE — Assessment & Plan Note (Signed)
Improved over last week.  Potentially could be related to her tachycardia.  She will continue to see cardiology.

## 2020-04-29 NOTE — Assessment & Plan Note (Signed)
She will continue Praluent and Zetia.  Discussed decreasing carbohydrate intake and increasing exercise to help with her triglycerides.  We will plan on rechecking lipid panel in 3 months and if her triglycerides have not improved we could consider Lovaza.

## 2020-04-29 NOTE — Progress Notes (Signed)
Tommi Rumps, MD Phone: [phone]  Dawn Herring is a 75 y.o. female who presents today for f/u.  CAD/hyperlipidemia/palpitations: Patient saw cardiology yesterday.  They did check her cholesterol and her LDL was near goal.  Her triglycerides were elevated.  She has been on Praluent and Zetia.  She also takes losartan and HCTZ.  She has been having frequent issues with tachycardia particularly at night and has had to take flecainide more frequently.  She will get discomfort into her shoulder blades bilaterally when this occurs and she will take Advil or Aleve.  No chest pain.  She has had some shortness of breath that has been ongoing though has been better over the last week.  No right upper quadrant pain.  No myalgias.  She has not been eating many sweets that does eat carbohydrates.  No alcohol intake.  Peripheral neuropathy: Patient does note discomfort in her feet particularly at night.  She wears special socks that do seem to help.  She declines medication for this.  Social History   Tobacco Use  Smoking Status Never Smoker  Smokeless Tobacco Never Used    Current Outpatient Medications on File Prior to Visit  Medication Sig Dispense Refill  . Alirocumab (PRALUENT) 150 MG/ML SOAJ Inject 150 mg into the skin every 14 (fourteen) days. 2 mL 11  . aspirin 81 MG tablet Take 81 mg by mouth daily.     Marland Kitchen ezetimibe (ZETIA) 10 MG tablet Take 1 tablet (10 mg total) by mouth daily. 90 tablet 3  . flecainide (TAMBOCOR) 50 MG tablet Take 1 tablet (50 mg total) by mouth 2 (two) times daily as needed. 60 tablet 6  . losartan-hydrochlorothiazide (HYZAAR) 100-12.5 MG tablet Take 1 tablet by mouth daily. 90 tablet 3  . metoprolol succinate (TOPROL-XL) 25 MG 24 hr tablet Take 1 tablet (25 mg total) by mouth daily. Take with or immediately following a meal. ok to start with 1/2 pill daily 30 tablet 3  . omeprazole (PRILOSEC) 20 MG capsule Take 1 capsule (20 mg total) by mouth daily. 90 capsule 3  .  propranolol (INDERAL) 10 MG tablet Take 1 tablet (10 mg total) by mouth 3 (three) times daily as needed. 270 tablet 3   No current facility-administered medications on file prior to visit.     ROS see history of present illness  Objective  Physical Exam Vitals:   04/29/20 1102  BP: 120/80  Pulse: 69  Temp: (!) 97 F (36.1 C)  SpO2: 96%    BP Readings from Last 3 Encounters:  04/29/20 120/80  04/20/20 124/64  06/26/19 140/70   Wt Readings from Last 3 Encounters:  04/29/20 192 lb 1.9 oz (87.1 kg)  04/24/20 193 lb (87.5 kg)  04/20/20 193 lb (87.5 kg)    Physical Exam Constitutional:      General: She is not in acute distress.    Appearance: She is not diaphoretic.  Cardiovascular:     Rate and Rhythm: Normal rate and regular rhythm.     Heart sounds: Murmur (2/6 systolic ejection murmur right upper sternal border) heard.    Pulmonary:     Effort: Pulmonary effort is normal.     Breath sounds: Normal breath sounds.  Musculoskeletal:        General: No edema.     Right lower leg: No edema.     Left lower leg: No edema.  Skin:    General: Skin is warm and dry.  Neurological:  Mental Status: She is alert.      Assessment/Plan: Please see individual problem list.  Problem List Items Addressed This Visit    Arrhythmia    She currently has a ZIO monitor on.  She will complete this for cardiology.  Her tachycardia could be contributing to her breathing issues.  Treatment per cardiology.      CAD (coronary artery disease)    Continue risk factor management.  She will continue to see cardiology as well.      DOE (dyspnea on exertion)    Improved over last week.  Potentially could be related to her tachycardia.  She will continue to see cardiology.      Essential hypertension    Adequate control.  Continue losartan/HCTZ 1 tablet daily.      Hyperlipidemia    She will continue Praluent and Zetia.  Discussed decreasing carbohydrate intake and increasing  exercise to help with her triglycerides.  We will plan on rechecking lipid panel in 3 months and if her triglycerides have not improved we could consider Lovaza.      Relevant Orders   Lipid panel   Neuropathy    Chronic.  Stable.  She declines medication.  She will let us know if she would like proceed with medication management in the future.      Prediabetes - Primary    Check A1c.      Relevant Orders   POCT HgB A1C    Other Visit Diagnoses    Need for pneumococcal vaccination       Relevant Orders   Pneumococcal polysaccharide vaccine 23-valent greater than or equal to 2yo subcutaneous/IM (Completed)     This visit occurred during the SARS-CoV-2 public health emergency.  Safety protocols were in place, including screening questions prior to the visit, additional usage of staff PPE, and extensive cleaning of exam room while observing appropriate contact time as indicated for disinfecting solutions.    Tommi Rumps, MD [place]

## 2020-05-04 DIAGNOSIS — I479 Paroxysmal tachycardia, unspecified: Secondary | ICD-10-CM | POA: Diagnosis not present

## 2020-05-11 DIAGNOSIS — I479 Paroxysmal tachycardia, unspecified: Secondary | ICD-10-CM | POA: Diagnosis not present

## 2020-05-25 MED ORDER — APIXABAN 5 MG PO TABS: 5.0000 mg | ORAL_TABLET | Freq: Two times a day (BID) | ORAL | 2 refills | Status: DC

## 2020-05-25 NOTE — Telephone Encounter (Signed)
Dawn Merritts, MD  05/24/2020 7:09 PM EST      Event monitor Atrial fibrillation noted, Would start eliquis 5 mg twice a day, stop aspirin, to prevent stroke Most a fib spells were not triggered for symptoms If symptoms get worse , could try regular dose of metoprolol succinate 12.5 daily, but limited secondary to bradycardia   Called patient to discuss MyChart message and the above results. She verbalized understanding of the results and to stop aspirin and start Eliquis 5 mg two times a day. She said her HR has been running around 55 and higher.  She wears an Apple Watch and mainly HR is in the 50's at night when she's asleep. She does not have any symptoms such as lightheadedness or dizziness to report.  She will let us know if HR goes below 50's and back down on metoprolol if needed.  Rx for Eliquis sent to pharmacy.

## 2020-06-01 ENCOUNTER — Ambulatory Visit
Admission: RE | Admit: 2020-06-01 | Discharge: 2020-06-01 | Disposition: A | Discharge status: Home / Self Care | Payer: Medicare Other | Source: Ambulatory Visit | Attending: Family Medicine | Admitting: Family Medicine

## 2020-06-01 ENCOUNTER — Other Ambulatory Visit: Payer: Self-pay

## 2020-06-01 DIAGNOSIS — Z780 Asymptomatic menopausal state: Secondary | ICD-10-CM

## 2020-06-01 DIAGNOSIS — M85852 Other specified disorders of bone density and structure, left thigh: Secondary | ICD-10-CM | POA: Diagnosis not present

## 2020-06-16 DIAGNOSIS — H5213 Myopia, bilateral: Secondary | ICD-10-CM | POA: Diagnosis not present

## 2020-06-16 DIAGNOSIS — Z961 Presence of intraocular lens: Secondary | ICD-10-CM | POA: Diagnosis not present

## 2020-06-22 ENCOUNTER — Other Ambulatory Visit: Payer: Self-pay | Admitting: Family Medicine

## 2020-06-22 ENCOUNTER — Telehealth: Payer: Self-pay | Admitting: Family Medicine

## 2020-06-22 DIAGNOSIS — R8290 Unspecified abnormal findings in urine: Secondary | ICD-10-CM

## 2020-06-22 NOTE — Telephone Encounter (Signed)
I called and spoke with the patient and asked her about doing a U/A at the time of her next lab appointment and she stated that would be okay.  Nina,cma

## 2020-06-22 NOTE — Telephone Encounter (Signed)
Please let the patient know that I got a message from our medical record system that she was supposed to complete a follow-up urinalysis about a year ago following a urinary tract infection.  She is coming in for labs in about a month or so.  We can plan on doing the urinalysis at that time.

## 2020-07-13 DIAGNOSIS — Z23 Encounter for immunization: Secondary | ICD-10-CM | POA: Diagnosis not present

## 2020-07-16 ENCOUNTER — Other Ambulatory Visit: Payer: Self-pay | Admitting: Cardiovascular Disease

## 2020-07-16 NOTE — Telephone Encounter (Signed)
Rx request sent to pharmacy.  

## 2020-07-27 ENCOUNTER — Other Ambulatory Visit: Payer: Self-pay

## 2020-07-27 ENCOUNTER — Other Ambulatory Visit (INDEPENDENT_AMBULATORY_CARE_PROVIDER_SITE_OTHER): Payer: Medicare Other

## 2020-07-27 DIAGNOSIS — R8290 Unspecified abnormal findings in urine: Secondary | ICD-10-CM | POA: Diagnosis not present

## 2020-07-27 DIAGNOSIS — E782 Mixed hyperlipidemia: Secondary | ICD-10-CM

## 2020-07-27 LAB — LIPID PANEL
Cholesterol: 157 mg/dL (ref 0–200)
HDL: 48.9 mg/dL (ref >39.00)
NonHDL: 108.13
Total CHOL/HDL Ratio: 3
Triglycerides: 326 mg/dL — ABNORMAL HIGH (ref 0.0–149.0)
VLDL: 65.2 mg/dL — ABNORMAL HIGH (ref 0.0–40.0)

## 2020-07-27 LAB — LDL CHOLESTEROL, DIRECT: Direct LDL: 59 mg/dL

## 2020-08-03 ENCOUNTER — Other Ambulatory Visit: Payer: Self-pay | Admitting: Family Medicine

## 2020-08-03 DIAGNOSIS — E782 Mixed hyperlipidemia: Secondary | ICD-10-CM

## 2020-08-03 MED ORDER — OMEGA-3-ACID ETHYL ESTERS 1 G PO CAPS: 2.0000 g | ORAL_CAPSULE | Freq: Two times a day (BID) | ORAL | 1 refills | Status: DC

## 2020-08-04 ENCOUNTER — Encounter: Payer: Self-pay | Admitting: Family Medicine

## 2020-08-04 ENCOUNTER — Other Ambulatory Visit: Payer: Self-pay | Admitting: Family Medicine

## 2020-08-04 NOTE — Telephone Encounter (Signed)
Please contact the pharmacy and see why they are requesting an alternative.  The alternative that is listed is not a cholesterol medication.  Thanks.

## 2020-09-07 ENCOUNTER — Other Ambulatory Visit: Payer: Medicare Other

## 2020-09-11 ENCOUNTER — Other Ambulatory Visit (INDEPENDENT_AMBULATORY_CARE_PROVIDER_SITE_OTHER): Payer: Medicare Other

## 2020-09-11 ENCOUNTER — Other Ambulatory Visit: Payer: Self-pay

## 2020-09-11 DIAGNOSIS — E782 Mixed hyperlipidemia: Secondary | ICD-10-CM

## 2020-09-11 LAB — LIPID PANEL
Cholesterol: 185 mg/dL (ref 0–200)
HDL: 47.9 mg/dL (ref >39.00)
Total CHOL/HDL Ratio: 4
Triglycerides: 441 mg/dL — ABNORMAL HIGH (ref 0.0–149.0)

## 2020-09-11 LAB — LDL CHOLESTEROL, DIRECT: Direct LDL: 76 mg/dL

## 2020-09-17 ENCOUNTER — Other Ambulatory Visit: Payer: Self-pay | Admitting: Family Medicine

## 2020-09-17 MED ORDER — ICOSAPENT ETHYL 1 G PO CAPS: 2.0000 g | ORAL_CAPSULE | Freq: Two times a day (BID) | ORAL | 2 refills | Status: DC

## 2020-09-17 NOTE — Progress Notes (Signed)
Left message for patient to return call back for lab results.

## 2020-10-26 ENCOUNTER — Other Ambulatory Visit: Payer: Self-pay | Admitting: Cardiovascular Disease

## 2020-10-26 NOTE — Telephone Encounter (Signed)
Please schedule F/U appointment. Thank you! 

## 2020-10-26 NOTE — Telephone Encounter (Signed)
Scheduled

## 2020-10-27 ENCOUNTER — Ambulatory Visit (INDEPENDENT_AMBULATORY_CARE_PROVIDER_SITE_OTHER): Payer: Medicare Other | Admitting: Family Medicine

## 2020-10-27 ENCOUNTER — Other Ambulatory Visit: Payer: Self-pay

## 2020-10-27 ENCOUNTER — Encounter: Payer: Self-pay | Admitting: Family Medicine

## 2020-10-27 VITALS — BP 139/70 | HR 60 | Temp 96.0°F | Ht 66.0 in | Wt 190.2 lb

## 2020-10-27 DIAGNOSIS — G629 Polyneuropathy, unspecified: Secondary | ICD-10-CM | POA: Diagnosis not present

## 2020-10-27 DIAGNOSIS — I2510 Atherosclerotic heart disease of native coronary artery without angina pectoris: Secondary | ICD-10-CM

## 2020-10-27 DIAGNOSIS — I499 Cardiac arrhythmia, unspecified: Secondary | ICD-10-CM

## 2020-10-27 DIAGNOSIS — Z1211 Encounter for screening for malignant neoplasm of colon: Secondary | ICD-10-CM | POA: Diagnosis not present

## 2020-10-27 DIAGNOSIS — R7303 Prediabetes: Secondary | ICD-10-CM | POA: Diagnosis not present

## 2020-10-27 DIAGNOSIS — Z1231 Encounter for screening mammogram for malignant neoplasm of breast: Secondary | ICD-10-CM

## 2020-10-27 DIAGNOSIS — L918 Other hypertrophic disorders of the skin: Secondary | ICD-10-CM

## 2020-10-27 DIAGNOSIS — E782 Mixed hyperlipidemia: Secondary | ICD-10-CM

## 2020-10-27 LAB — COMPREHENSIVE METABOLIC PANEL
ALT: 10 U/L (ref 0–35)
AST: 16 U/L (ref 0–37)
Albumin: 4.2 g/dL (ref 3.5–5.2)
Alkaline Phosphatase: 53 U/L (ref 39–117)
BUN: 18 mg/dL (ref 6–23)
CO2: 30 mEq/L (ref 19–32)
Calcium: 9.7 mg/dL (ref 8.4–10.5)
Chloride: 101 mEq/L (ref 96–112)
Creatinine, Ser: 0.99 mg/dL (ref 0.40–1.20)
GFR: 55.73 mL/min — ABNORMAL LOW (ref >60.00)
Glucose, Bld: 96 mg/dL (ref 70–99)
Potassium: 4.2 mEq/L (ref 3.5–5.1)
Sodium: 141 mEq/L (ref 135–145)
Total Bilirubin: 0.7 mg/dL (ref 0.2–1.2)
Total Protein: 7.2 g/dL (ref 6.0–8.3)

## 2020-10-27 LAB — LIPID PANEL
Cholesterol: 194 mg/dL (ref 0–200)
HDL: 47.5 mg/dL (ref >39.00)
NonHDL: 146.9
Total CHOL/HDL Ratio: 4
Triglycerides: 330 mg/dL — ABNORMAL HIGH (ref 0.0–149.0)
VLDL: 66 mg/dL — ABNORMAL HIGH (ref 0.0–40.0)

## 2020-10-27 LAB — HEMOGLOBIN A1C: Hgb A1c MFr Bld: 6.1 % (ref 4.6–6.5)

## 2020-10-27 LAB — LDL CHOLESTEROL, DIRECT: Direct LDL: 86 mg/dL

## 2020-10-27 MED ORDER — GABAPENTIN 100 MG PO CAPS: 100.0000 mg | ORAL_CAPSULE | Freq: Every day | ORAL | 3 refills | Status: DC

## 2020-10-27 NOTE — Assessment & Plan Note (Signed)
Chronic issue.  Progressively worsening.  We will trial gabapentin 100 mg nightly.  Discussed risk of drowsiness with this.  If she gets drowsy with it she will let us know.  If she has no benefit she will let us know.

## 2020-10-27 NOTE — Patient Instructions (Signed)
Nice to see you. Please call [phone] to schedule your mammogram. We will contact you with the lab results. Dermatology should contact you to schedule an appointment. Please try the gabapentin once nightly to see if that helps with your neuropathy.

## 2020-10-27 NOTE — Assessment & Plan Note (Signed)
Check A1c.  She will continue diet and exercise.

## 2020-10-27 NOTE — Assessment & Plan Note (Signed)
Check labs.  She is fasting.  She will continue Praluent, Zetia, and Vascepa.

## 2020-10-27 NOTE — Assessment & Plan Note (Signed)
Refer to dermatology 

## 2020-10-27 NOTE — Progress Notes (Signed)
Tommi Rumps, MD Phone: [phone]  Dawn Herring is a 75 y.o. female who presents today for f/u.  HYPERLIPIDEMIA Symptoms Chest pain on exertion:  no   shortness of breath: No Medications: Compliance-taking Praluent, Zetia, Vascepa right upper quadrant pain-no muscle aches-no  Atrial fibrillation: Patient she had a run of A. fib on Saturday.  She took her flecainide and it helped to resolve.  Her heart rate got into the 160s.  She has cut out caffeine.  She has about 2 episodes a month.  She continues on Eliquis and metoprolol.  Prediabetes: No polyuria or polydipsia.  She  notes no sweet tea.  She rarely has a soda.  Uses a treadmill for exercise 3 times a week.  Diet consists of fish, chicken, fruits, and vegetables.  Neuropathy: Patient notes this bothers her mostly at night.  Keeps her awake.  Describes it as a burning stinging sensation in the bottom of both of her feet.  Skin tags: Patient requests referral to dermatology.   Social History   Tobacco Use  Smoking Status Never  Smokeless Tobacco Never    Current Outpatient Medications on File Prior to Visit  Medication Sig Dispense Refill   Alirocumab (PRALUENT) 150 MG/ML SOAJ Inject 150 mg into the skin every 14 (fourteen) days. 2 mL 11   apixaban (ELIQUIS) 5 MG TABS tablet Take 1 tablet (5 mg total) by mouth 2 (two) times daily. 180 tablet 2   ezetimibe (ZETIA) 10 MG tablet Take 1 tablet (10 mg total) by mouth daily. 90 tablet 3   flecainide (TAMBOCOR) 50 MG tablet Take 1 tablet (50 mg total) by mouth 2 (two) times daily as needed. PLEASE CALL TO SCHEDULE OFFICE VISIT FOR FURTHER REFILLS. THANK YOU! 60 tablet 0   icosapent Ethyl (VASCEPA) 1 g capsule Take 2 capsules (2 g total) by mouth 2 (two) times daily. 120 capsule 2   losartan-hydrochlorothiazide (HYZAAR) 100-12.5 MG tablet Take 1 tablet by mouth daily. 90 tablet 3   metoprolol succinate (TOPROL-XL) 25 MG 24 hr tablet TAKE 1 TABLET BY MOUTH DAILY TAKE WITH OR  IMMEDIATELY FOLLOWING A MEAL OK TO START WITH 1/2TAB DAILY 90 tablet 1   omeprazole (PRILOSEC) 20 MG capsule Take 1 capsule (20 mg total) by mouth daily. 90 capsule 3   No current facility-administered medications on file prior to visit.     ROS see history of present illness  Objective  Physical Exam Vitals:   10/27/20 1058  BP: 139/70  Pulse: 60  Temp: (!) 96 F (35.6 C)  SpO2: 99%    BP Readings from Last 3 Encounters:  10/27/20 139/70  04/29/20 120/80  04/20/20 124/64   Wt Readings from Last 3 Encounters:  10/27/20 190 lb 3.2 oz (86.3 kg)  04/29/20 192 lb 1.9 oz (87.1 kg)  04/24/20 193 lb (87.5 kg)    Physical Exam Constitutional:      General: She is not in acute distress.    Appearance: She is not diaphoretic.  Cardiovascular:     Rate and Rhythm: Normal rate and regular rhythm.     Heart sounds: Normal heart sounds.     Comments: 2+ DP and PT pulses bilaterally Pulmonary:     Effort: Pulmonary effort is normal.     Breath sounds: Normal breath sounds.  Skin:    General: Skin is warm and dry.  Neurological:     Mental Status: She is alert.     Comments: Sensation to light touch intact bilateral  feet     Assessment/Plan: Please see individual problem list.  Problem List Items Addressed This Visit     Arrhythmia    Generally stable.  She will continue metoprolol and Eliquis.  She will continue to see cardiology.       Cutaneous skin tags    Refer to dermatology.       Relevant Orders   Ambulatory referral to Dermatology   Hyperlipidemia    Check labs.  She is fasting.  She will continue Praluent, Zetia, and Vascepa.       Relevant Orders   Comp Met (CMET)   Lipid panel   Neuropathy - Primary    Chronic issue.  Progressively worsening.  We will trial gabapentin 100 mg nightly.  Discussed risk of drowsiness with this.  If she gets drowsy with it she will let us know.  If she has no benefit she will let us know.       Relevant  Medications   gabapentin (NEURONTIN) 100 MG capsule   Prediabetes    Check A1c.  She will continue diet and exercise.       Relevant Orders   HgB A1c   Other Visit Diagnoses     Colon cancer screening       Relevant Orders   Cologuard   Encounter for screening mammogram for malignant neoplasm of breast       Relevant Orders   MM 3D SCREEN BREAST BILATERAL        Health Maintenance: Cologuard ordered.  She will call to schedule her mammogram.  Return in about 3 months (around 01/27/2021) for neuropathy.  This visit occurred during the SARS-CoV-2 public health emergency.  Safety protocols were in place, including screening questions prior to the visit, additional usage of staff PPE, and extensive cleaning of exam room while observing appropriate contact time as indicated for disinfecting solutions.    Tommi Rumps, MD [place]

## 2020-10-27 NOTE — Assessment & Plan Note (Signed)
Generally stable.  She will continue metoprolol and Eliquis.  She will continue to see cardiology.

## 2020-10-31 DIAGNOSIS — Z1211 Encounter for screening for malignant neoplasm of colon: Secondary | ICD-10-CM | POA: Diagnosis not present

## 2020-11-04 ENCOUNTER — Other Ambulatory Visit: Payer: Self-pay | Admitting: Family Medicine

## 2020-11-04 DIAGNOSIS — Z1231 Encounter for screening mammogram for malignant neoplasm of breast: Secondary | ICD-10-CM

## 2020-11-09 ENCOUNTER — Ambulatory Visit (INDEPENDENT_AMBULATORY_CARE_PROVIDER_SITE_OTHER): Payer: Medicare Other | Admitting: Dermatology

## 2020-11-09 ENCOUNTER — Other Ambulatory Visit: Payer: Self-pay

## 2020-11-09 DIAGNOSIS — I2510 Atherosclerotic heart disease of native coronary artery without angina pectoris: Secondary | ICD-10-CM | POA: Diagnosis not present

## 2020-11-09 DIAGNOSIS — D229 Melanocytic nevi, unspecified: Secondary | ICD-10-CM | POA: Diagnosis not present

## 2020-11-09 DIAGNOSIS — L719 Rosacea, unspecified: Secondary | ICD-10-CM | POA: Diagnosis not present

## 2020-11-09 DIAGNOSIS — D1800 Hemangioma unspecified site: Secondary | ICD-10-CM | POA: Diagnosis not present

## 2020-11-09 DIAGNOSIS — L578 Other skin changes due to chronic exposure to nonionizing radiation: Secondary | ICD-10-CM | POA: Diagnosis not present

## 2020-11-09 DIAGNOSIS — L814 Other melanin hyperpigmentation: Secondary | ICD-10-CM

## 2020-11-09 DIAGNOSIS — L821 Other seborrheic keratosis: Secondary | ICD-10-CM

## 2020-11-09 DIAGNOSIS — L820 Inflamed seborrheic keratosis: Secondary | ICD-10-CM

## 2020-11-09 DIAGNOSIS — Z1283 Encounter for screening for malignant neoplasm of skin: Secondary | ICD-10-CM | POA: Diagnosis not present

## 2020-11-09 DIAGNOSIS — D2271 Melanocytic nevi of right lower limb, including hip: Secondary | ICD-10-CM

## 2020-11-09 LAB — COLOGUARD: Cologuard: NEGATIVE

## 2020-11-09 NOTE — Progress Notes (Addendum)
New Patient Visit  Subjective  Dawn Herring is a 75 y.o. female who presents for the following: Annual Exam (Patient has noticed numerous scattered lesions on the face, back, and inframammary areas that she would like checked today. ). The patient presents for Total-Body Skin Exam (TBSE) for skin cancer screening and mole check.  The following portions of the chart were reviewed this encounter and updated as appropriate:   Tobacco  Allergies  Meds  Problems  Med Hx  Surg Hx  Fam Hx     Review of Systems:  No other skin or systemic complaints except as noted in HPI or Assessment and Plan.  Objective  Well appearing patient in no apparent distress; mood and affect are within normal limits.  A full examination was performed including scalp, head, eyes, ears, nose, lips, neck, chest, axillae, abdomen, back, buttocks, bilateral upper extremities, bilateral lower extremities, hands, feet, fingers, toes, fingernails, and toenails. All findings within normal limits unless otherwise noted below.   Assessment & Plan  Rosacea Face  Rosacea is a chronic progressive skin condition usually affecting the face of adults, causing redness and/or acne bumps. It is treatable but not curable. It sometimes affects the eyes (ocular rosacea) as well. It may respond to topical and/or systemic medication and can flare with stress, sun exposure, alcohol, exercise and some foods.  Daily application of broad spectrum spf 30+ sunscreen to face is recommended to reduce flares.  Start [facility] mix QHS.   Inflamed seborrheic keratosis Trunk, extremities x 19  Destruction of lesion - Trunk, extremities x 19 Complexity: simple   Destruction method: cryotherapy   Informed consent: discussed and consent obtained   Timeout:  patient name, date of birth, surgical site, and procedure verified Lesion destroyed using liquid nitrogen: Yes   Region frozen until ice ball extended beyond lesion: Yes    Outcome: patient tolerated procedure well with no complications   Post-procedure details: wound care instructions given    Nevus Right Thigh - Anterior  Benign-appearing.  Observation.  Call clinic for new or changing lesions.  Recommend daily use of broad spectrum spf 30+ sunscreen to sun-exposed areas.    Seborrheic keratosis (2) B/L cheek  Destruction of lesion - B/L cheek Complexity: simple   Destruction method: cryotherapy   Informed consent: discussed and consent obtained   Timeout:  patient name, date of birth, surgical site, and procedure verified Lesion destroyed using liquid nitrogen: Yes   Region frozen until ice ball extended beyond lesion: Yes   Outcome: patient tolerated procedure well with no complications   Post-procedure details: wound care instructions given    Skin cancer screening  Other seborrheic keratosis  Lentigines - Scattered tan macules - Due to sun exposure - Benign-appering, observe - Recommend daily broad spectrum sunscreen SPF 30+ to sun-exposed areas, reapply every 2 hours as needed. - Call for any changes  Seborrheic Keratoses - Stuck-on, waxy, tan-brown papules and/or plaques  - Benign-appearing - Discussed benign etiology and prognosis. - Observe - Call for any changes  Melanocytic Nevi - Tan-brown and/or pink-flesh-colored symmetric macules and papules - Benign appearing on exam today - Observation - Call clinic for new or changing moles - Recommend daily use of broad spectrum spf 30+ sunscreen to sun-exposed areas.   Hemangiomas - Red papules - Discussed benign nature - Observe - Call for any changes  Actinic Damage - Chronic condition, secondary to cumulative UV/sun exposure - diffuse scaly erythematous macules with underlying dyspigmentation - Recommend  daily broad spectrum sunscreen SPF 30+ to sun-exposed areas, reapply every 2 hours as needed.  - Staying in the shade or wearing long sleeves, sun glasses (UVA+UVB  protection) and wide brim hats (4-inch brim around the entire circumference of the hat) are also recommended for sun protection.  - Call for new or changing lesions.  Skin cancer screening performed today.  Return in about 3 months (around 02/09/2021) for SK follow up .  Luther Redo, CMA, am acting as scribe for Sarina Ser, MD . Documentation: I have reviewed the above documentation for accuracy and completeness, and I agree with the above.  Sarina Ser, MD

## 2020-11-09 NOTE — Patient Instructions (Addendum)
If you have any questions or concerns for your doctor, please call our main line at [phone] and press option 4 to reach your doctor's medical assistant. If no one answers, please leave a voicemail as directed and we will return your call as soon as possible. Messages left after 4 pm will be answered the following business day.   You may also send us a message via MyChart. We typically respond to MyChart messages within 1-2 business days.  For prescription refills, please ask your pharmacy to contact our office. Our fax number is [phone].  If you have an urgent issue when the clinic is closed that cannot wait until the next business day, you can page your doctor at the number below.    Please note that while we do our best to be available for urgent issues outside of office hours, we are not available 24/7.   If you have an urgent issue and are unable to reach us, you may choose to seek medical care at your doctor's office, retail clinic, urgent care center, or emergency room.  If you have a medical emergency, please immediately call 911 or go to the emergency department.  Pager Numbers  - Dr. Kowalski: [phone]  - Dr. Moye: [phone]  - Dr. Stewart: [phone]  In the event of inclement weather, please call our main line at [phone] for an update on the status of any delays or closures.  Dermatology Medication Tips: Please keep the boxes that topical medications come in in order to help keep track of the instructions about where and how to use these. Pharmacies typically print the medication instructions only on the boxes and not directly on the medication tubes.   If your medication is too expensive, please contact our office at [phone] option 4 or send us a message through MyChart.   We are unable to tell what your co-pay for medications will be in advance as this is different depending on your insurance coverage. However, we may be able to find a substitute  medication at lower cost or fill out paperwork to get insurance to cover a needed medication.   If a prior authorization is required to get your medication covered by your insurance company, please allow us 1-2 business days to complete this process.  Drug prices often vary depending on where the prescription is filled and some pharmacies may offer cheaper prices.  The website www.goodrx.com contains coupons for medications through different pharmacies. The prices here do not account for what the cost may be with help from insurance (it may be cheaper with your insurance), but the website can give you the price if you did not use any insurance.  - You can print the associated coupon and take it with your prescription to the pharmacy.  - You may also stop by our office during regular business hours and pick up a GoodRx coupon card.  - If you need your prescription sent electronically to a different pharmacy, notify our office through [facility] MyChart or by phone at [phone] option 4.  Instructions for [facility] Medications  One or more of your medications was sent to the [facility] mail order compounding pharmacy. You will receive an email from them and can purchase the medicine through that link. It will then be mailed to your home at the address you confirmed. If for any reason you do not receive an email from them, please check your spam folder. If you still do not find the email,   please let us know. [facility] phone number is [phone].   

## 2020-11-11 ENCOUNTER — Encounter: Payer: Self-pay | Admitting: Dermatology

## 2020-11-12 ENCOUNTER — Other Ambulatory Visit: Payer: Self-pay

## 2020-11-12 ENCOUNTER — Ambulatory Visit
Admission: RE | Admit: 2020-11-12 | Discharge: 2020-11-12 | Disposition: A | Discharge status: Home / Self Care | Payer: Medicare Other | Source: Ambulatory Visit | Attending: Family Medicine | Admitting: Family Medicine

## 2020-11-12 DIAGNOSIS — Z1231 Encounter for screening mammogram for malignant neoplasm of breast: Secondary | ICD-10-CM | POA: Diagnosis not present

## 2020-11-17 NOTE — Addendum Note (Signed)
Addended by: Ralene Bathe on: 11/17/2020 09:08 AM   Modules accepted: Level of Service

## 2020-11-18 ENCOUNTER — Other Ambulatory Visit: Payer: Self-pay | Admitting: Cardiovascular Disease

## 2020-12-13 ENCOUNTER — Other Ambulatory Visit: Payer: Self-pay | Admitting: Family Medicine

## 2020-12-16 NOTE — Progress Notes (Signed)
Cardiology Office Note  Date:  12/18/2020   ID:  Dawn Herring, Dawn Herring 1945-05-27, MRN [id number]  PCP:  Leone Haven, MD   Chief Complaint  Patient presents with   6 month follow up     Patient had a spell of tachycardia/PVC's  last night that lasted about two hours. Medications reviewed by the patient verbally.     HPI:  Dawn Herring is a very pleasant 75 year old woman with past medical history of  hypertension,  familial hypercholesterolemia,  chest pain and atrial tachycardia, dating back >10 years with negative stress tests Prior CT coronary calcium score  190,  started on more aggressive cholesterol regiment at that time PVCs seen on prior EKGs Event monitor December 2016 showing APCs, runs of  atrial tachycardia Who presents for routine followup of her hyperlipidemia hypertension, and arrhythmia  Atrial fib last night, 2 hrs, woke her up midnight Took flecainide, tylenol for neck pain Resolved after 2 hrs  No exercise Neuropathy on praluent, zetia,vascepa   EKG personally reviewed by myself on todays visit Shows sinus bradycardia rate 53 bpm T wave abnormality V3 through V6 1 and aVL  Zio monitor 06/2020, reviewed Normal sinus rhythm with paroxysmal atrial fibrillation. Patient had a min HR of 43 bpm, max HR of 177 bpm, and avg HR of 57 bpm.    Atrial Fibrillation occurred (<1% burden), ranging from 84-177 bpm (avg of 128 bpm), the longest  lasting 1 hour 52 mins with an avg rate of 128 bpm.  Atrial Fibrillation was detected within +/- 45 seconds of symptomatic patient event(s).  Numerous episodes of atrial fibrillation were not triggered.    Isolated SVEs were rare (<1.0%), SVE Couplets were rare (<1.0%), and SVE Triplets were rare (<1.0%). Isolated VEs  were rare (<1.0%, 907), VE Triplets were rare (<1.0%, 1), and no VE Couplets were present. Ventricular Bigeminy and Trigeminy were present.   Other past medical history reviewed  Cataract surgery: sept 26th,  2017 Second cataract surgery  last Thursday, anesthesia noted irregular rhythm She did not notice any symptoms Reports that she has not felt well since that time, feels weak   Off pralunt, changed to repatha,  She received samples from the office, did not the to fill her prescription despite aggressive marketing from specialists pharmacy. Was told that they would cancel her prescription. She was paying $600 per month and reports that she was able to afford this. Prescription now canceled? Has been without medication for several months   Previous 30 day monitor again reviewed with her showing runs of atrial tachycardia, run of SVT, APCs, PVCs   Previously had significant myalgias on the fluvastatin  Symptoms improved by holding the medication Previously had gallbladder attack  and in the setting of severe pain, had tachycardia concerning for arrhythmia  Prior episode of tachycardia concerning for arrhythmia in the setting of back pain in the past   Strong family history of familial hypercholesterolemia.  She has tried numerous cholesterol medications in the past including Crestor, Lipitor, lovastatin. She did not tolerate these secondary to side effects.     PMH:   has a past medical history of Chest pain, Essential hypertension, Hiatal hernia, History of DVT (deep vein thrombosis), Hyperlipidemia, and Palpitations.  PSH:    Past Surgical History:  Procedure Laterality Date   CATARACT EXTRACTION, BILATERAL     LAPAROSCOPIC APPENDECTOMY N/A 12/02/2015   Procedure: APPENDECTOMY LAPAROSCOPIC;  Surgeon: Clovis Riley, MD;  Location: [facility];  Service: General;  Laterality: N/A;   ROOT CANAL  2013   ROOT CANAL     VAGINAL HYSTERECTOMY     abdominal hysterectomy    Current Outpatient Medications  Medication Sig Dispense Refill   Alirocumab (PRALUENT) 150 MG/ML SOAJ Inject 150 mg into the skin every 14 (fourteen) days. 2 mL 11   apixaban (ELIQUIS) 5 MG TABS tablet Take 1 tablet (5 mg  total) by mouth 2 (two) times daily. 180 tablet 2   ezetimibe (ZETIA) 10 MG tablet Take 1 tablet (10 mg total) by mouth daily. 90 tablet 3   flecainide (TAMBOCOR) 50 MG tablet TAKE 1 TABLET 2 TIMES DAILY AS NEEDED. PLEASE CALL TO SCHEDULE OFFICE VISIT FOR FURTHER REFILLS. 60 tablet 0   gabapentin (NEURONTIN) 100 MG capsule Take 1 capsule (100 mg total) by mouth at bedtime. 90 capsule 3   losartan-hydrochlorothiazide (HYZAAR) 100-12.5 MG tablet Take 1 tablet by mouth daily. 90 tablet 3   metoprolol succinate (TOPROL-XL) 25 MG 24 hr tablet TAKE 1 TABLET BY MOUTH DAILY TAKE WITH OR IMMEDIATELY FOLLOWING A MEAL OK TO START WITH 1/2TAB DAILY 90 tablet 1   omeprazole (PRILOSEC) 20 MG capsule Take 1 capsule (20 mg total) by mouth daily. 90 capsule 3   VASCEPA 1 g capsule TAKE 2 CAPSULES BY MOUTH 2 TIMES DAILY. 120 capsule 2   No current facility-administered medications for this visit.     Allergies:   Statins   Social History:  The patient  reports that she has never smoked. She has never used smokeless tobacco. She reports that she does not drink alcohol and does not use drugs.   Family History:   family history includes Colon cancer in an other family member; Heart disease in her brother, father, and mother; Hyperlipidemia in her sister; Stroke in her brother; Thyroid cancer in her brother.    Review of Systems: Review of Systems  Constitutional: Negative.   HENT: Negative.    Respiratory: Negative.    Cardiovascular:        Episodes of tachycardia  Gastrointestinal: Negative.   Musculoskeletal: Negative.   Neurological: Negative.   Psychiatric/Behavioral: Negative.    All other systems reviewed and are negative.  PHYSICAL EXAM: VS:  BP 130/60 (BP Location: Left Arm, Patient Position: Sitting, Cuff Size: Normal)   Pulse (!) 53   Ht 5\' 5"  (1.651 m)   Wt 195 lb 8 oz (88.7 kg)   SpO2 98%   BMI 32.53 kg/m  , BMI Body mass index is 32.53 kg/m. Constitutional:  oriented to person,  place, and time. No distress.  HENT:  Head: Grossly normal Eyes:  no discharge. No scleral icterus.  Neck: No JVD, no carotid bruits  Cardiovascular: Regular rate and rhythm, no murmurs appreciated Pulmonary/Chest: Clear to auscultation bilaterally, no wheezes or rails Abdominal: Soft.  no distension.  no tenderness.  Musculoskeletal: Normal range of motion Neurological:  normal muscle tone. Coordination normal. No atrophy Skin: Skin warm and dry Psychiatric: normal affect, pleasant  Recent Labs: 10/27/2020: ALT 10; BUN 18; Creatinine, Ser 0.99; Potassium 4.2; Sodium 141    Lipid Panel Lab Results  Component Value Date   CHOL 194 10/27/2020   HDL 47.50 10/27/2020   LDLCALC 83 04/20/2020   TRIG 330.0 (H) 10/27/2020    Wt Readings from Last 3 Encounters:  12/18/20 195 lb 8 oz (88.7 kg)  10/27/20 190 lb 3.2 oz (86.3 kg)  04/29/20 192 lb 1.9 oz (87.1 kg)     ASSESSMENT AND PLAN:  Pure hypercholesterolemia - on her praluent, zetioa, vascepa LDL close to goal We have encouraged continued exercise, careful diet management   Coronary artery disease involving native coronary artery of native heart without angina pectoris -   CT coronary calcium score 190 Continue Praluent with Zetia Statin intolerance No angina  Atrial fibrillation, paroxysmal On metoprolol qpm For breakthrough, flecainde 50 to 100 and propranolol  Essential hypertension Blood pressure is well controlled on today's visit. No changes made to the medications.    Total encounter time more than 25 minutes  Greater than 50% was spent in counseling and coordination of care with the patient      Orders Placed This Encounter  Procedures   EKG 12-Lead     Signed, Esmond Plants, M.D., Ph.D. 12/18/2020  [place], [place]

## 2020-12-18 ENCOUNTER — Ambulatory Visit (INDEPENDENT_AMBULATORY_CARE_PROVIDER_SITE_OTHER): Payer: Medicare Other | Admitting: Cardiovascular Disease

## 2020-12-18 ENCOUNTER — Encounter: Payer: Self-pay | Admitting: Cardiovascular Disease

## 2020-12-18 ENCOUNTER — Other Ambulatory Visit: Payer: Self-pay

## 2020-12-18 VITALS — BP 130/60 | HR 53 | Ht 65.0 in | Wt 195.5 lb

## 2020-12-18 DIAGNOSIS — I479 Paroxysmal tachycardia, unspecified: Secondary | ICD-10-CM

## 2020-12-18 DIAGNOSIS — I25118 Atherosclerotic heart disease of native coronary artery with other forms of angina pectoris: Secondary | ICD-10-CM | POA: Diagnosis not present

## 2020-12-18 DIAGNOSIS — I2510 Atherosclerotic heart disease of native coronary artery without angina pectoris: Secondary | ICD-10-CM

## 2020-12-18 DIAGNOSIS — I10 Essential (primary) hypertension: Secondary | ICD-10-CM | POA: Diagnosis not present

## 2020-12-18 DIAGNOSIS — I499 Cardiac arrhythmia, unspecified: Secondary | ICD-10-CM

## 2020-12-18 DIAGNOSIS — E782 Mixed hyperlipidemia: Secondary | ICD-10-CM

## 2020-12-18 DIAGNOSIS — I480 Paroxysmal atrial fibrillation: Secondary | ICD-10-CM | POA: Diagnosis not present

## 2020-12-18 MED ORDER — EZETIMIBE 10 MG PO TABS: 10.0000 mg | ORAL_TABLET | Freq: Every day | ORAL | 3 refills | Status: DC

## 2020-12-18 MED ORDER — FLECAINIDE ACETATE 50 MG PO TABS: ORAL_TABLET | ORAL | 3 refills | Status: DC

## 2020-12-18 MED ORDER — METOPROLOL SUCCINATE ER 25 MG PO TB24: ORAL_TABLET | ORAL | 1 refills | Status: DC

## 2020-12-18 MED ORDER — LOSARTAN POTASSIUM-HCTZ 100-12.5 MG PO TABS: 1.0000 | ORAL_TABLET | Freq: Every day | ORAL | 3 refills | Status: DC

## 2020-12-18 NOTE — Patient Instructions (Addendum)
Medication Instructions:  No changes  Flecainide 50 to 100 mg as needed  Propanolol 10 mg as needed for atrial fib  If you need a refill on your cardiac medications before your next appointment, please call your pharmacy.   Lab work: No new labs needed  Testing/Procedures: No new testing needed  Follow-Up: At [facility], you and your health needs are our priority.  As part of our continuing mission to provide you with exceptional heart care, we have created designated Provider Care Teams.  These Care Teams include your primary Cardiologist (physician) and Advanced Practice Providers (APPs -  Physician Assistants and Nurse Practitioners) who all work together to provide you with the care you need, when you need it.  You will need a follow up appointment in 12 months  Providers on your designated Care Team:   Murray Hodgkins, NP Christell Faith, PA-C Marrianne Mood, PA-C Cadence Ed[place], [place]  COVID-19 Vaccine Information can be found at: ShippingScam.co.uk For questions related to vaccine distribution or appointments, please email [email or web address] or call [phone].

## 2021-01-29 ENCOUNTER — Ambulatory Visit: Payer: Medicare Other | Admitting: Family Medicine

## 2021-02-02 ENCOUNTER — Ambulatory Visit: Payer: Medicare Other | Admitting: Family Medicine

## 2021-02-11 ENCOUNTER — Other Ambulatory Visit: Payer: Self-pay | Admitting: Cardiovascular Disease

## 2021-02-11 NOTE — Telephone Encounter (Signed)
Prescription refill request for Eliquis received. Indication: PAF Last office visit: 12/18/20  Johnny Bridge MD Scr: 0.99 on 10/27/20 Age: 75 Weight: 88.7kg  Based on above findings Eliquis 5mg  twice daily is the appropriate dose.  Refill approved.

## 2021-02-17 ENCOUNTER — Ambulatory Visit (INDEPENDENT_AMBULATORY_CARE_PROVIDER_SITE_OTHER): Payer: Medicare Other | Admitting: Family Medicine

## 2021-02-17 ENCOUNTER — Encounter: Payer: Self-pay | Admitting: Family Medicine

## 2021-02-17 ENCOUNTER — Other Ambulatory Visit: Payer: Self-pay

## 2021-02-17 VITALS — BP 125/80 | HR 62 | Temp 98.1°F | Ht 65.0 in | Wt 192.6 lb

## 2021-02-17 DIAGNOSIS — I2510 Atherosclerotic heart disease of native coronary artery without angina pectoris: Secondary | ICD-10-CM | POA: Diagnosis not present

## 2021-02-17 DIAGNOSIS — M25521 Pain in right elbow: Secondary | ICD-10-CM | POA: Diagnosis not present

## 2021-02-17 DIAGNOSIS — Z23 Encounter for immunization: Secondary | ICD-10-CM | POA: Diagnosis not present

## 2021-02-17 MED ORDER — PREDNISONE 10 MG PO TABS: ORAL_TABLET | ORAL | 0 refills | Status: AC

## 2021-02-17 NOTE — Patient Instructions (Signed)
Nice to see you. Please try the steroid taper.  If you develop excessive agitation please let us know. If your pain worsens significantly please get reevaluated.

## 2021-02-17 NOTE — Progress Notes (Signed)
Tommi Rumps, MD Phone: [phone]  Dawn Herring is a 75 y.o. female who presents today for same-day visit.  Right elbow pain: Patient notes sudden onset right elbow pain starting on 02/12/2021.  She went to pick up a mug and had discomfort in her right elbow that limited her ability to flex the elbow.  She does have a history of gout.  She started taking some colchicine and does note it helps for a little while.  Hurts with flexion of the elbow.  She has had some elbow swelling.  There was some warmth.  No erythema.  Tylenol has not helped much.  She notes no specific injury.  Social History   Tobacco Use  Smoking Status Never  Smokeless Tobacco Never    Current Outpatient Medications on File Prior to Visit  Medication Sig Dispense Refill   Alirocumab (PRALUENT) 150 MG/ML SOAJ Inject 150 mg into the skin every 14 (fourteen) days. 2 mL 11   apixaban (ELIQUIS) 5 MG TABS tablet TAKE 1 TABLET BY MOUTH TWICE A DAY 180 tablet 1   ezetimibe (ZETIA) 10 MG tablet Take 1 tablet (10 mg total) by mouth daily. 90 tablet 3   flecainide (TAMBOCOR) 50 MG tablet TAKE 1 TABLET 2 TIMES DAILY AS NEEDED. 90 tablet 3   gabapentin (NEURONTIN) 100 MG capsule Take 1 capsule (100 mg total) by mouth at bedtime. 90 capsule 3   losartan-hydrochlorothiazide (HYZAAR) 100-12.5 MG tablet Take 1 tablet by mouth daily. 90 tablet 3   metoprolol succinate (TOPROL-XL) 25 MG 24 hr tablet TAKE 1 TABLET BY MOUTH DAILY TAKE WITH OR IMMEDIATELY FOLLOWING A MEAL OK TO START WITH 1/2TAB DAILY 90 tablet 1   omeprazole (PRILOSEC) 20 MG capsule Take 1 capsule (20 mg total) by mouth daily. 90 capsule 3   propranolol (INDERAL) 10 MG tablet Take 10 mg by mouth 3 (three) times daily as needed.     VASCEPA 1 g capsule TAKE 2 CAPSULES BY MOUTH 2 TIMES DAILY. 120 capsule 2   No current facility-administered medications on file prior to visit.     ROS see history of present illness  Objective  Physical Exam Vitals:   02/17/21  0936  BP: 125/80  Pulse: 62  Temp: 98.1 F (36.7 C)  SpO2: 98%    BP Readings from Last 3 Encounters:  02/17/21 125/80  12/18/20 130/60  10/27/20 139/70   Wt Readings from Last 3 Encounters:  02/17/21 192 lb 9.6 oz (87.4 kg)  12/18/20 195 lb 8 oz (88.7 kg)  10/27/20 190 lb 3.2 oz (86.3 kg)    Physical Exam Constitutional:      General: She is not in acute distress.    Appearance: She is not diaphoretic.  Pulmonary:     Effort: Pulmonary effort is normal.  Musculoskeletal:     Comments: Right elbow with slight swelling, there is some discomfort in the posterior aspect of the elbow, no apparent olecranon bursa enlargement or olecranon bursa tenderness, there is no erythema, there is no apparent warmth  Neurological:     Mental Status: She is alert.     Assessment/Plan: Please see individual problem list.  Problem List Items Addressed This Visit     Right elbow pain    Likely related to gout given her history and description though could be related to other inflammation, arthritis, or an unknown injury.  There does not appear to be any infection.  We will treat with a prednisone taper given the potential for  gout.  She has follow-up with me in a couple of weeks and we will recheck her at that time.  I discussed the potential for increased appetite, sleeping difficulty, and agitation with the prednisone.      Relevant Medications   predniSONE (DELTASONE) 10 MG tablet     Health Maintenance: Flu vaccine given today.  Return for As scheduled.  This visit occurred during the SARS-CoV-2 public health emergency.  Safety protocols were in place, including screening questions prior to the visit, additional usage of staff PPE, and extensive cleaning of exam room while observing appropriate contact time as indicated for disinfecting solutions.    Tommi Rumps, MD [place]

## 2021-02-17 NOTE — Assessment & Plan Note (Addendum)
Likely related to gout given her history and description though could be related to other inflammation, arthritis, or an unknown injury.  There does not appear to be any infection.  We will treat with a prednisone taper given the potential for gout.  She has follow-up with me in a couple of weeks and we will recheck her at that time.  I discussed the potential for increased appetite, sleeping difficulty, and agitation with the prednisone.

## 2021-03-02 ENCOUNTER — Other Ambulatory Visit: Payer: Self-pay

## 2021-03-02 ENCOUNTER — Ambulatory Visit (INDEPENDENT_AMBULATORY_CARE_PROVIDER_SITE_OTHER): Payer: Medicare Other | Admitting: Dermatology

## 2021-03-02 DIAGNOSIS — L821 Other seborrheic keratosis: Secondary | ICD-10-CM

## 2021-03-02 NOTE — Patient Instructions (Signed)

## 2021-03-02 NOTE — Progress Notes (Signed)
   Follow-Up Visit   Subjective  CHARI PARMENTER is a 75 y.o. female who presents for the following: Recheck SK (Some residual lesion left on the L cheek - all other Sk's, and ISK's have resolved per patient. ).  The following portions of the chart were reviewed this encounter and updated as appropriate:   Tobacco  Allergies  Meds  Problems  Med Hx  Surg Hx  Fam Hx     Review of Systems:  No other skin or systemic complaints except as noted in HPI or Assessment and Plan.  Objective  Well appearing patient in no apparent distress; mood and affect are within normal limits.  A focused examination was performed including the face. Relevant physical exam findings are noted in the Assessment and Plan.  L cheek x 1 Stuck-on, waxy, tan-brown papules and plaques -- Discussed benign etiology and prognosis.    Assessment & Plan  Seborrheic keratosis L cheek x 1  Previously treated cosmetically, some residual today, will tx with LN2 again. No charge today for "cosmetic touch up".  Destruction of lesion - L cheek x 1 Complexity: simple   Destruction method: cryotherapy   Informed consent: discussed and consent obtained   Timeout:  patient name, date of birth, surgical site, and procedure verified Lesion destroyed using liquid nitrogen: Yes   Region frozen until ice ball extended beyond lesion: Yes   Outcome: patient tolerated procedure well with no complications   Post-procedure details: wound care instructions given    Return in about 9 months (around 12/01/2021) for TBSE and rosacea f/u.  Luther Redo, CMA, am acting as scribe for Sarina Ser, MD . Documentation: I have reviewed the above documentation for accuracy and completeness, and I agree with the above.  Sarina Ser, MD

## 2021-03-09 ENCOUNTER — Other Ambulatory Visit: Payer: Self-pay | Admitting: Family Medicine

## 2021-03-10 ENCOUNTER — Ambulatory Visit (INDEPENDENT_AMBULATORY_CARE_PROVIDER_SITE_OTHER): Payer: Medicare Other | Admitting: Family Medicine

## 2021-03-10 ENCOUNTER — Encounter: Payer: Self-pay | Admitting: Family Medicine

## 2021-03-10 ENCOUNTER — Ambulatory Visit (INDEPENDENT_AMBULATORY_CARE_PROVIDER_SITE_OTHER): Payer: Medicare Other

## 2021-03-10 ENCOUNTER — Other Ambulatory Visit: Payer: Self-pay

## 2021-03-10 VITALS — BP 130/80 | HR 61 | Temp 97.7°F | Ht 65.0 in | Wt 196.0 lb

## 2021-03-10 DIAGNOSIS — M1A0210 Idiopathic chronic gout, right elbow, without tophus (tophi): Secondary | ICD-10-CM

## 2021-03-10 DIAGNOSIS — G629 Polyneuropathy, unspecified: Secondary | ICD-10-CM

## 2021-03-10 DIAGNOSIS — I499 Cardiac arrhythmia, unspecified: Secondary | ICD-10-CM

## 2021-03-10 DIAGNOSIS — M25521 Pain in right elbow: Secondary | ICD-10-CM

## 2021-03-10 DIAGNOSIS — E782 Mixed hyperlipidemia: Secondary | ICD-10-CM | POA: Diagnosis not present

## 2021-03-10 DIAGNOSIS — I2510 Atherosclerotic heart disease of native coronary artery without angina pectoris: Secondary | ICD-10-CM | POA: Diagnosis not present

## 2021-03-10 DIAGNOSIS — M778 Other enthesopathies, not elsewhere classified: Secondary | ICD-10-CM | POA: Diagnosis not present

## 2021-03-10 DIAGNOSIS — M19021 Primary osteoarthritis, right elbow: Secondary | ICD-10-CM | POA: Diagnosis not present

## 2021-03-10 LAB — BASIC METABOLIC PANEL
BUN: 23 mg/dL (ref 6–23)
CO2: 28 mEq/L (ref 19–32)
Calcium: 9.1 mg/dL (ref 8.4–10.5)
Chloride: 103 mEq/L (ref 96–112)
Creatinine, Ser: 0.97 mg/dL (ref 0.40–1.20)
GFR: 56.97 mL/min — ABNORMAL LOW (ref >60.00)
Glucose, Bld: 92 mg/dL (ref 70–99)
Potassium: 3.5 mEq/L (ref 3.5–5.1)
Sodium: 141 mEq/L (ref 135–145)

## 2021-03-10 LAB — CBC WITH DIFFERENTIAL/PLATELET
Basophils Absolute: 0 10*3/uL (ref 0.0–0.1)
Basophils Relative: 0.9 % (ref 0.0–3.0)
Eosinophils Absolute: 0.1 10*3/uL (ref 0.0–0.7)
Eosinophils Relative: 2.8 % (ref 0.0–5.0)
HCT: 36.9 % (ref 36.0–46.0)
Hemoglobin: 12.4 g/dL (ref 12.0–15.0)
Lymphocytes Relative: 26.7 % (ref 12.0–46.0)
Lymphs Abs: 1.4 10*3/uL (ref 0.7–4.0)
MCHC: 33.6 g/dL (ref 30.0–36.0)
MCV: 96.9 fl (ref 78.0–100.0)
Monocytes Absolute: 0.5 10*3/uL (ref 0.1–1.0)
Monocytes Relative: 9.6 % (ref 3.0–12.0)
Neutro Abs: 3.2 10*3/uL (ref 1.4–7.7)
Neutrophils Relative %: 60 % (ref 43.0–77.0)
Platelets: 233 10*3/uL (ref 150.0–400.0)
RBC: 3.81 Mil/uL — ABNORMAL LOW (ref 3.87–5.11)
RDW: 12.8 % (ref 11.5–15.5)
WBC: 5.4 10*3/uL (ref 4.0–10.5)

## 2021-03-10 LAB — URIC ACID: Uric Acid, Serum: 7.7 mg/dL — ABNORMAL HIGH (ref 2.4–7.0)

## 2021-03-10 NOTE — Assessment & Plan Note (Signed)
Generally well controlled.  She can continue as needed gabapentin use.

## 2021-03-10 NOTE — Assessment & Plan Note (Signed)
I suspect this is likely related to gout or arthritis.  We will get an x-ray today.  We will also check a uric acid.  Discussed the potential for treating again with prednisone or starting on a medication specifically for gout.

## 2021-03-10 NOTE — Patient Instructions (Signed)
Nice to see. We will check lab work today and contact you with results. We will also get an x-ray to help determine treatment of the right elbow pain. If you ever develop persistent palpitations that do not resolve with taking flecainide you need to be seen in the emergency department.

## 2021-03-10 NOTE — Assessment & Plan Note (Signed)
Occasionally has symptoms.  She will continue her Eliquis.  She will continue to see cardiology and continued their recommended use of flecainide.

## 2021-03-10 NOTE — Progress Notes (Signed)
Tommi Rumps, MD Phone: [phone]  Dawn Herring is a 75 y.o. female who presents today for f/u.  HYPERLIPIDEMIA Symptoms Chest pain on exertion:  no   Leg claudication:   no Medications: Compliance- taking praluent, zetia, vascepa Right upper quadrant pain- no  Muscle aches- no Lipid Panel     Component Value Date/Time   CHOL 194 10/27/2020 1125   CHOL 204 (H) 04/20/2020 1146   TRIG 330.0 (H) 10/27/2020 1125   HDL 47.50 10/27/2020 1125   HDL 49 04/20/2020 1146   CHOLHDL 4 10/27/2020 1125   VLDL 66.0 (H) 10/27/2020 1125   LDLCALC 83 04/20/2020 1146   LDLDIRECT 86.0 10/27/2020 1125   LABVLDL 72 (H) 04/20/2020 1146   Atrial fibrillation: Patient had an issue 2 Sundays ago where her heart rate went up into the 160s.  She took a flecainide and notes this resolved after couple of hours.  Notes this occurs once monthly.  She does remain on Eliquis.  She does follow with cardiology.  Right elbow pain: Patient notes that this got completely better after taking a course of prednisone though 2 days ago started to throb again.  She notes no injury.  Still bothers her on flexion of the elbow.  She does have a history of gout.  Neuropathy: Patient notes this has not been terribly bothersome.  She is only taking the gabapentin on an as-needed basis.  Social History   Tobacco Use  Smoking Status Never  Smokeless Tobacco Never    Current Outpatient Medications on File Prior to Visit  Medication Sig Dispense Refill   Alirocumab (PRALUENT) 150 MG/ML SOAJ Inject 150 mg into the skin every 14 (fourteen) days. 2 mL 11   apixaban (ELIQUIS) 5 MG TABS tablet TAKE 1 TABLET BY MOUTH TWICE A DAY 180 tablet 1   ezetimibe (ZETIA) 10 MG tablet Take 1 tablet (10 mg total) by mouth daily. 90 tablet 3   flecainide (TAMBOCOR) 50 MG tablet TAKE 1 TABLET 2 TIMES DAILY AS NEEDED. 90 tablet 3   gabapentin (NEURONTIN) 100 MG capsule Take 1 capsule (100 mg total) by mouth at bedtime. 90 capsule 3    losartan-hydrochlorothiazide (HYZAAR) 100-12.5 MG tablet Take 1 tablet by mouth daily. 90 tablet 3   metoprolol succinate (TOPROL-XL) 25 MG 24 hr tablet TAKE 1 TABLET BY MOUTH DAILY TAKE WITH OR IMMEDIATELY FOLLOWING A MEAL OK TO START WITH 1/2TAB DAILY 90 tablet 1   omeprazole (PRILOSEC) 20 MG capsule Take 1 capsule (20 mg total) by mouth daily. 90 capsule 3   propranolol (INDERAL) 10 MG tablet Take 10 mg by mouth 3 (three) times daily as needed.     VASCEPA 1 g capsule TAKE 2 CAPSULES BY MOUTH TWICE A DAY 120 capsule 2   No current facility-administered medications on file prior to visit.     ROS see history of present illness  Objective  Physical Exam Vitals:   03/10/21 1005  BP: 130/80  Pulse: 61  Temp: 97.7 F (36.5 C)  SpO2: 97%    BP Readings from Last 3 Encounters:  03/10/21 130/80  02/17/21 125/80  12/18/20 130/60   Wt Readings from Last 3 Encounters:  03/10/21 196 lb (88.9 kg)  02/17/21 192 lb 9.6 oz (87.4 kg)  12/18/20 195 lb 8 oz (88.7 kg)    Physical Exam Constitutional:      General: She is not in acute distress.    Appearance: She is not diaphoretic.  Cardiovascular:  Rate and Rhythm: Normal rate and regular rhythm.     Heart sounds: Normal heart sounds.  Pulmonary:     Effort: Pulmonary effort is normal.     Breath sounds: Normal breath sounds.  Musculoskeletal:     Comments: In her right elbow there is slight tenderness of the soft tissues between the olecranon process and the lateral epicondyle, no swelling noted, no tenderness at the lateral epicondyle or the olecranon process, no discomfort on resisted pronation or supination of her right forearm or flexion or extension of her wrist  Skin:    General: Skin is warm and dry.  Neurological:     Mental Status: She is alert.     Assessment/Plan: Please see individual problem list.  Problem List Items Addressed This Visit     Arrhythmia    Occasionally has symptoms.  She will continue her  Eliquis.  She will continue to see cardiology and continued their recommended use of flecainide.      Hyperlipidemia    She will continue Praluent, Zetia, and Vascepa.      Neuropathy    Generally well controlled.  She can continue as needed gabapentin use.      Right elbow pain    I suspect this is likely related to gout or arthritis.  We will get an x-ray today.  We will also check a uric acid.  Discussed the potential for treating again with prednisone or starting on a medication specifically for gout.      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  This visit occurred during the SARS-CoV-2 public health emergency.  Safety protocols were in place, including screening questions prior to the visit, additional usage of staff PPE, and extensive cleaning of exam room while observing appropriate contact time as indicated for disinfecting solutions.    Tommi Rumps, MD [place]

## 2021-03-10 NOTE — Assessment & Plan Note (Signed)
She will continue Praluent, Zetia, and Vascepa.

## 2021-03-12 ENCOUNTER — Encounter: Payer: Self-pay | Admitting: Dermatology

## 2021-03-15 ENCOUNTER — Telehealth: Payer: Self-pay | Admitting: Family Medicine

## 2021-03-15 NOTE — Telephone Encounter (Signed)
Pt returning call. Pt requesting callback.  °

## 2021-03-31 ENCOUNTER — Encounter: Payer: Self-pay | Admitting: Cardiovascular Disease

## 2021-04-19 ENCOUNTER — Other Ambulatory Visit: Payer: Self-pay | Admitting: Cardiovascular Disease

## 2021-04-27 ENCOUNTER — Ambulatory Visit: Payer: Medicare Other

## 2021-05-14 ENCOUNTER — Ambulatory Visit (INDEPENDENT_AMBULATORY_CARE_PROVIDER_SITE_OTHER): Payer: Medicare Other

## 2021-05-14 VITALS — Ht 65.0 in | Wt 190.0 lb

## 2021-05-14 DIAGNOSIS — Z0000 Encounter for general adult medical examination without abnormal findings: Secondary | ICD-10-CM

## 2021-05-14 NOTE — Patient Instructions (Addendum)
Dawn Herring , Thank you for taking time to come for your Medicare Wellness Visit. I appreciate your ongoing commitment to your health goals. Please review the following plan we discussed and let me know if I can assist you in the future.   These are the goals we discussed:  Goals      I would like to lose weight     Stay active       Low carb foods        This is a list of the screening recommended for you and due dates:  Health Maintenance  Topic Date Due   COVID-19 Vaccine (5 - Booster for Moderna series) 05/30/2021*   Zoster (Shingles) Vaccine (1 of 2) 08/11/2021*   Tetanus Vaccine  05/14/2022*   Pneumonia Vaccine  Completed   Flu Shot  Completed   DEXA scan (bone density measurement)  Completed   Hepatitis C Screening: USPSTF Recommendation to screen - Ages 33-79 yo.  Completed   HPV Vaccine  Aged Out   Cologuard (Stool DNA test)  Discontinued  *Topic was postponed. The date shown is not the original due date.    Advanced directives: not yet completed  Conditions/risks identified: none new  Follow up in one year for your annual wellness visit    Preventive Care 65 Years and Older, Female Preventive care refers to lifestyle choices and visits with your health care provider that can promote health and wellness. What does preventive care include? A yearly physical exam. This is also called an annual well check. Dental exams once or twice a year. Routine eye exams. Ask your health care provider how often you should have your eyes checked. Personal lifestyle choices, including: Daily care of your teeth and gums. Regular physical activity. Eating a healthy diet. Avoiding tobacco and drug use. Limiting alcohol use. Practicing safe sex. Taking low-dose aspirin every day. Taking vitamin and mineral supplements as recommended by your health care provider. What happens during an annual well check? The services and screenings done by your health care provider during your  annual well check will depend on your age, overall health, lifestyle risk factors, and family history of disease. Counseling  Your health care provider may ask you questions about your: Alcohol use. Tobacco use. Drug use. Emotional well-being. Home and relationship well-being. Sexual activity. Eating habits. History of falls. Memory and ability to understand (cognition). Work and work Statistician. Reproductive health. Screening  You may have the following tests or measurements: Height, weight, and BMI. Blood pressure. Lipid and cholesterol levels. These may be checked every 5 years, or more frequently if you are over 8 years old. Skin check. Lung cancer screening. You may have this screening every year starting at age 55 if you have a 30-pack-year history of smoking and currently smoke or have quit within the past 15 years. Fecal occult blood test (FOBT) of the stool. You may have this test every year starting at age 39. Flexible sigmoidoscopy or colonoscopy. You may have a sigmoidoscopy every 5 years or a colonoscopy every 10 years starting at age 42. Hepatitis C blood test. Hepatitis B blood test. Sexually transmitted disease (STD) testing. Diabetes screening. This is done by checking your blood sugar (glucose) after you have not eaten for a while (fasting). You may have this done every 1-3 years. Bone density scan. This is done to screen for osteoporosis. You may have this done starting at age 86. Mammogram. This may be done every 1-2 years. Talk to your  health care provider about how often you should have regular mammograms. Talk with your health care provider about your test results, treatment options, and if necessary, the need for more tests. Vaccines  Your health care provider may recommend certain vaccines, such as: Influenza vaccine. This is recommended every year. Tetanus, diphtheria, and acellular pertussis (Tdap, Td) vaccine. You may need a Td booster every 10  years. Zoster vaccine. You may need this after age 10. Pneumococcal 13-valent conjugate (PCV13) vaccine. One dose is recommended after age 105. Pneumococcal polysaccharide (PPSV23) vaccine. One dose is recommended after age 76. Talk to your health care provider about which screenings and vaccines you need and how often you need them. This information is not intended to replace advice given to you by your health care provider. Make sure you discuss any questions you have with your health care provider. Document Released: 04/10/2015 Document Revised: 12/02/2015 Document Reviewed: 01/13/2015 Elsevier Interactive Patient Education  2017 [employer] Prevention in the Home Falls can cause injuries. They can happen to people of all ages. There are many things you can do to make your home safe and to help prevent falls. What can I do on the outside of my home? Regularly fix the edges of walkways and driveways and fix any cracks. Remove anything that might make you trip as you walk through a door, such as a raised step or threshold. Trim any bushes or trees on the path to your home. Use bright outdoor lighting. Clear any walking paths of anything that might make someone trip, such as rocks or tools. Regularly check to see if handrails are loose or broken. Make sure that both sides of any steps have handrails. Any raised decks and porches should have guardrails on the edges. Have any leaves, snow, or ice cleared regularly. Use sand or salt on walking paths during winter. Clean up any spills in your garage right away. This includes oil or grease spills. What can I do in the bathroom? Use night lights. Install grab bars by the toilet and in the tub and shower. Do not use towel bars as grab bars. Use non-skid mats or decals in the tub or shower. If you need to sit down in the shower, use a plastic, non-slip stool. Keep the floor dry. Clean up any water that spills on the floor as soon as it  happens. Remove soap buildup in the tub or shower regularly. Attach bath mats securely with double-sided non-slip rug tape. Do not have throw rugs and other things on the floor that can make you trip. What can I do in the bedroom? Use night lights. Make sure that you have a light by your bed that is easy to reach. Do not use any sheets or blankets that are too big for your bed. They should not hang down onto the floor. Have a firm chair that has side arms. You can use this for support while you get dressed. Do not have throw rugs and other things on the floor that can make you trip. What can I do in the kitchen? Clean up any spills right away. Avoid walking on wet floors. Keep items that you use a lot in easy-to-reach places. If you need to reach something above you, use a strong step stool that has a grab bar. Keep electrical cords out of the way. Do not use floor polish or wax that makes floors slippery. If you must use wax, use non-skid floor wax. Do not have  throw rugs and other things on the floor that can make you trip. What can I do with my stairs? Do not leave any items on the stairs. Make sure that there are handrails on both sides of the stairs and use them. Fix handrails that are broken or loose. Make sure that handrails are as long as the stairways. Check any carpeting to make sure that it is firmly attached to the stairs. Fix any carpet that is loose or worn. Avoid having throw rugs at the top or bottom of the stairs. If you do have throw rugs, attach them to the floor with carpet tape. Make sure that you have a light switch at the top of the stairs and the bottom of the stairs. If you do not have them, ask someone to add them for you. What else can I do to help prevent falls? Wear shoes that: Do not have high heels. Have rubber bottoms. Are comfortable and fit you well. Are closed at the toe. Do not wear sandals. If you use a stepladder: Make sure that it is fully opened.  Do not climb a closed stepladder. Make sure that both sides of the stepladder are locked into place. Ask someone to hold it for you, if possible. Clearly mark and make sure that you can see: Any grab bars or handrails. First and last steps. Where the edge of each step is. Use tools that help you move around (mobility aids) if they are needed. These include: Canes. Walkers. Scooters. Crutches. Turn on the lights when you go into a dark area. Replace any light bulbs as soon as they burn out. Set up your furniture so you have a clear path. Avoid moving your furniture around. If any of your floors are uneven, fix them. If there are any pets around you, be aware of where they are. Review your medicines with your doctor. Some medicines can make you feel dizzy. This can increase your chance of falling. Ask your doctor what other things that you can do to help prevent falls. This information is not intended to replace advice given to you by your health care provider. Make sure you discuss any questions you have with your health care provider. Document Released: 01/08/2009 Document Revised: 08/20/2015 Document Reviewed: 04/18/2014 Elsevier Interactive Patient Education  2017 [employer].

## 2021-05-14 NOTE — Progress Notes (Signed)
Subjective:   Dawn Herring is a 76 y.o. female who presents for Medicare Annual (Subsequent) preventive examination.  Review of Systems    No ROS.  Medicare Wellness Virtual Visit.  Visual/audio telehealth visit, UTA vital signs.   See social history for additional risk factors.   Cardiac Risk Factors include: advanced age (>14men, >16 women);hypertension     Objective:    Today's Vitals   05/14/21 1125  Weight: 190 lb (86.2 kg)  Height: 5\' 5"  (1.651 m)   Body mass index is 31.62 kg/m.  Advanced Directives 05/14/2021 04/24/2020 02/07/2018 08/25/2017 08/24/2016 12/02/2015  Does Patient Have a Medical Advance Directive? No Yes Yes Yes Yes No  Type of Advance Directive - [place];Living will [place];Living will;Out of facility DNR (pink MOST or yellow form) [place];Living will [place];Living will -  Does patient want to make changes to medical advance directive? - No - Patient declined No - Patient declined No - Patient declined No - Patient declined -  Copy of [place] in Chart? - No - copy requested - No - copy requested No - copy requested -  Would patient like information on creating a medical advance directive? No - Patient declined - - - - No - patient declined information    Current Medications (verified) Outpatient Encounter Medications as of 05/14/2021  Medication Sig   Alirocumab (PRALUENT) 150 MG/ML SOAJ Inject 150 mg into the skin every 14 (fourteen) days.   apixaban (ELIQUIS) 5 MG TABS tablet TAKE 1 TABLET BY MOUTH TWICE A DAY   ezetimibe (ZETIA) 10 MG tablet Take 1 tablet (10 mg total) by mouth daily.   flecainide (TAMBOCOR) 50 MG tablet TAKE 1 TABLET 2 TIMES DAILY AS NEEDED.   gabapentin (NEURONTIN) 100 MG capsule Take 1 capsule (100 mg total) by mouth at bedtime.   losartan-hydrochlorothiazide (HYZAAR) 100-12.5 MG tablet Take 1 tablet by mouth daily.   metoprolol  succinate (TOPROL-XL) 25 MG 24 hr tablet TAKE 1 TABLET BY MOUTH DAILY TAKE WITH OR IMMEDIATELY FOLLOWING A MEAL OK TO START WITH 1/2TAB DAILY   omeprazole (PRILOSEC) 20 MG capsule TAKE 1 CAPSULE BY MOUTH EVERY DAY   VASCEPA 1 g capsule TAKE 2 CAPSULES BY MOUTH TWICE A DAY   [DISCONTINUED] propranolol (INDERAL) 10 MG tablet Take 10 mg by mouth 3 (three) times daily as needed.   No facility-administered encounter medications on file as of 05/14/2021.    Allergies (verified) Statins   History: Past Medical History:  Diagnosis Date   Chest pain    a. Neg stress tests - 2005, 2008, 2009;  b. 08/2014 Cardiac CT: Ca score 193 (80th%'ile).   Essential hypertension    Hiatal hernia    a. 08/2014 CT chest: large hiatal hernia.   History of DVT (deep vein thrombosis)    Hyperlipidemia    a. statin intolerant;  b. on zetia and repatha.   Palpitations    a. 02/2015 Event Monitor: NSR w/ rare, short episodes of A Tach, rare short runs of SVT < 15 beats.   Past Surgical History:  Procedure Laterality Date   CATARACT EXTRACTION, BILATERAL     LAPAROSCOPIC APPENDECTOMY N/A 12/02/2015   Procedure: APPENDECTOMY LAPAROSCOPIC;  Surgeon: Clovis Riley, MD;  Location: [facility];  Service: General;  Laterality: N/A;   ROOT CANAL  2013   ROOT CANAL     VAGINAL HYSTERECTOMY     abdominal hysterectomy  Family History  Problem Relation Age of Onset   Heart disease Mother    Heart disease Father    Colon cancer Other        Grandparent   Thyroid cancer Brother    Heart disease Brother    Stroke Brother    Hyperlipidemia Sister    Social History   Socioeconomic History   Marital status: Married    Spouse name: Not on file   Number of children: Not on file   Years of education: Not on file   Highest education level: Not on file  Occupational History   Not on file  Tobacco Use   Smoking status: Never   Smokeless tobacco: Never  Vaping Use   Vaping Use: Never used  Substance and Sexual  Activity   Alcohol use: No   Drug use: No   Sexual activity: Yes  Other Topics Concern   Not on file  Social History Narrative   Not on file   Social Determinants of Health   Financial Resource Strain: Not on file  Food Insecurity: Not on file  Transportation Needs: Not on file  Physical Activity: Not on file  Stress: Not on file  Social Connections: Not on file    Tobacco Counseling Counseling given: Not Answered   Clinical Intake:  Pre-visit preparation completed: Yes        Diabetes: No  How often do you need to have someone help you when you read instructions, pamphlets, or other written materials from your doctor or pharmacy?: 1 - Never   Interpreter Needed?: No      Activities of Daily Living In your present state of health, do you have any difficulty performing the following activities: 05/14/2021  Hearing? N  Vision? N  Difficulty concentrating or making decisions? N  Walking or climbing stairs? N  Dressing or bathing? N  Doing errands, shopping? N  Preparing Food and eating ? N  Using the Toilet? N  In the past six months, have you accidently leaked urine? N  Do you have problems with loss of bowel control? N  Managing your Medications? N  Managing your Finances? N  Housekeeping or managing your Housekeeping? N  Some recent data might be hidden    Patient Care Team: Leone Haven, MD as PCP - General (Family Medicine) Rockey Situ Kathlene November, MD as Consulting Physician (Cardiology)  Indicate any recent Medical Services you may have received from other than [facility] providers in the past year (date may be approximate).     Assessment:   This is a routine wellness examination for [facility]  Virtual Visit via Telephone Note  I connected with  Dawn Herring on 05/14/21 at 11:15 AM EST by telephone and verified that I am speaking with the correct person using two identifiers.  Persons participating in the virtual visit: patient/Nurse Health Advisor    I discussed the limitations, risks, security and privacy concerns of performing an evaluation and management service by telephone and the availability of in person appointments. The patient expressed understanding and agreed to proceed.  Interactive audio and video telecommunications were attempted between this nurse and patient, however failed, due to patient having technical difficulties OR patient did not have access to video capability.  We continued and completed visit with audio only.  Some vital signs may be absent or patient reported.   Hearing/Vision screen Hearing Screening - Comments:: Patient is able to hear conversational tones without difficulty. No issues reported. Vision Screening -  Comments:: Followed by [facility]  Cataract extraction, bilateral  They have regular follow up with the ophthalmologist  Dietary issues and exercise activities discussed: Current Exercise Habits: Home exercise routine, Type of exercise: walking, Intensity: Mild Healthy diet Good water intake    Goals Addressed             This Visit's Progress    I would like to lose weight       Stay active         Depression Screen PHQ 2/9 Scores 05/14/2021 02/17/2021 10/27/2020 04/24/2020 06/26/2019 08/25/2017 08/24/2016  PHQ - 2 Score 0 0 0 0 0 0 0    Fall Risk Fall Risk  05/14/2021 02/17/2021 10/27/2020 04/24/2020 06/26/2019  Falls in the past year? 0 0 0 0 0  Comment - - - - -  Number falls in past yr: 0 0 0 0 0  Comment - - - - -  Injury with Fall? - 0 - 0 -  Risk for fall due to : - No Fall Risks - - -  Follow up Falls evaluation completed Falls evaluation completed Falls evaluation completed Falls evaluation completed Falls evaluation completed   [place]: Home free of loose throw rugs in walkways, pet beds, electrical cords, etc? Yes  Adequate lighting in your home to reduce risk of falls? Yes   ASSISTIVE DEVICES UTILIZED TO PREVENT  FALLS: Life alert? No  Use of a cane, walker or w/c? No  Grab bars in the bathroom? Yes  Shower chair or bench in shower? Yes  Elevated toilet seat or a handicapped toilet? Yes   TIMED UP AND GO: Was the test performed? No .   Cognitive Function: Patient is alert and oriented x3.      6CIT Screen 08/25/2017 08/24/2016  What Year? 0 points 0 points  What month? 0 points 0 points  What time? 0 points 0 points  Count back from 20 0 points 0 points  Months in reverse 0 points 0 points  Repeat phrase 0 points 0 points  Total Score 0 0   Immunizations Immunization History  Administered Date(s) Administered   Fluad Quad(high Dose 65+) 04/08/2019, 04/20/2020, 02/17/2021   Hepatitis B, adult 03/17/2015, 04/21/2015, 09/15/2015   Influenza, High Dose Seasonal PF 01/13/2017, 01/17/2018   Influenza,inj,Quad PF,6+ Mos 03/03/2015, 02/17/2016   Moderna Sars-Covid-2 Vaccination 05/09/2019, 06/03/2019, 01/24/2020, 07/13/2020   Pneumococcal Conjugate-13 07/14/2016   Pneumococcal Polysaccharide-23 04/29/2020   TDAP status: Due, Education has been provided regarding the importance of this vaccine. Advised may receive this vaccine at local pharmacy or Health Dept. Aware to provide a copy of the vaccination record if obtained from local pharmacy or Health Dept. Verbalized acceptance and understanding. Deferred.   Shingrix Completed?: No.    Education has been provided regarding the importance of this vaccine. Patient has been advised to call insurance company to determine out of pocket expense if they have not yet received this vaccine. Advised may also receive vaccine at local pharmacy or Health Dept. Verbalized acceptance and understanding.  Screening Tests Health Maintenance  Topic Date Due   COVID-19 Vaccine (5 - Booster for Moderna series) 05/30/2021 (Originally 09/07/2020)   Zoster Vaccines- Shingrix (1 of 2) 08/11/2021 (Originally 03/12/1965)   TETANUS/TDAP  05/14/2022 (Originally  03/12/1965)   Pneumonia Vaccine 75+ Years old  Completed   INFLUENZA VACCINE  Completed   DEXA SCAN  Completed   Hepatitis C Screening  Completed   HPV VACCINES  Aged Out   Fecal DNA (Cologuard)  Discontinued   Health Maintenance There are no preventive care reminders to display for this patient.  Colorectal cancer screening: Type of screening: Cologuard. Completed 10/2020. Repeat every 3 years  Mammogram status: Completed 10/2020. Repeat every year  Lung Cancer Screening: (Low Dose CT Chest recommended if Age 62-80 years, 30 pack-year currently smoking OR have quit w/in 15years.) does not qualify.   Vision Screening: Recommended annual ophthalmology exams for early detection of glaucoma and other disorders of the eye.  Dental Screening: Recommended annual dental exams for proper oral hygiene  Community Resource Referral / Chronic Care Management: CRR required this visit?  No   CCM required this visit?  No      Plan:   Keep all routine maintenance appointments.   I have personally reviewed and noted the following in the patients chart:   Medical and social history Use of alcohol, tobacco or illicit drugs  Current medications and supplements including opioid prescriptions. Not taking opioid.  Functional ability and status Nutritional status Physical activity Advanced directives List of other physicians Hospitalizations, surgeries, and ER visits in previous 12 months Vitals Screenings to include cognitive, depression, and falls Referrals and appointments  In addition, I have reviewed and discussed with patient certain preventive protocols, quality metrics, and best practice recommendations. A written personalized care plan for preventive services as well as general preventive health recommendations were provided to patient via mychart.     Varney Biles, LPN   1/56/1537

## 2021-06-08 ENCOUNTER — Encounter: Payer: Self-pay | Admitting: Family Medicine

## 2021-06-08 ENCOUNTER — Ambulatory Visit (INDEPENDENT_AMBULATORY_CARE_PROVIDER_SITE_OTHER): Payer: Medicare Other | Admitting: Family Medicine

## 2021-06-08 ENCOUNTER — Other Ambulatory Visit: Payer: Self-pay

## 2021-06-08 VITALS — BP 120/60 | HR 58 | Temp 98.1°F | Ht 65.0 in | Wt 194.4 lb

## 2021-06-08 DIAGNOSIS — M1A2790 Drug-induced chronic gout, unspecified ankle and foot, without tophus (tophi): Secondary | ICD-10-CM | POA: Diagnosis not present

## 2021-06-08 DIAGNOSIS — J309 Allergic rhinitis, unspecified: Secondary | ICD-10-CM | POA: Insufficient documentation

## 2021-06-08 DIAGNOSIS — M109 Gout, unspecified: Secondary | ICD-10-CM | POA: Insufficient documentation

## 2021-06-08 DIAGNOSIS — I10 Essential (primary) hypertension: Secondary | ICD-10-CM

## 2021-06-08 DIAGNOSIS — R7303 Prediabetes: Secondary | ICD-10-CM

## 2021-06-08 DIAGNOSIS — L299 Pruritus, unspecified: Secondary | ICD-10-CM | POA: Diagnosis not present

## 2021-06-08 DIAGNOSIS — I499 Cardiac arrhythmia, unspecified: Secondary | ICD-10-CM | POA: Diagnosis not present

## 2021-06-08 LAB — HEPATIC FUNCTION PANEL
ALT: 9 U/L (ref 0–35)
AST: 15 U/L (ref 0–37)
Albumin: 4 g/dL (ref 3.5–5.2)
Alkaline Phosphatase: 52 U/L (ref 39–117)
Bilirubin, Direct: 0.1 mg/dL (ref 0.0–0.3)
Total Bilirubin: 0.5 mg/dL (ref 0.2–1.2)
Total Protein: 7.1 g/dL (ref 6.0–8.3)

## 2021-06-08 LAB — HEMOGLOBIN A1C: Hgb A1c MFr Bld: 6.1 % (ref 4.6–6.5)

## 2021-06-08 MED ORDER — LOSARTAN POTASSIUM 100 MG PO TABS: 100.0000 mg | ORAL_TABLET | Freq: Every day | ORAL | 3 refills | Status: DC

## 2021-06-08 NOTE — Patient Instructions (Signed)
Nice to see you. ?Please try either Flonase, Nasonex, or Astepro for your allergy symptoms.  If those are not helping please let us know. ?You will stop the hydrochlorothiazide.  I sent in a new prescription for losartan.  We will have you return in 2 weeks to check your blood pressure. ?We will contact you with the results of your liver enzyme testing. ?

## 2021-06-08 NOTE — Assessment & Plan Note (Signed)
I doubt this is related to Eliquis.  We will check hepatic enzymes to evaluate for an underlying cause. ?

## 2021-06-08 NOTE — Assessment & Plan Note (Signed)
Adequately controlled.  She is going to remain on losartan 100 mg once daily and metoprolol 25 mg daily.  We will discontinue hydrochlorothiazide given the potential for gout flares with this. ?

## 2021-06-08 NOTE — Assessment & Plan Note (Addendum)
I suspect the patient has allergic rhinitis with eustachian tube dysfunction contributing to her symptoms.  She will try either over-the-counter Flonase, Nasonex, or Astepro.  She does not have a history of glaucoma.  If she is not improving with 1 of those she can let us know. ?

## 2021-06-08 NOTE — Assessment & Plan Note (Addendum)
I suspect her HCTZ is playing a role in this.  She will discontinue HCTZ.  I do not think her current issue in her left Achilles tendon is related to gout.  I suspect she has tendinitis there.  She will ice the area and wear compression socks.  If not improving she will let us know. ?

## 2021-06-08 NOTE — Progress Notes (Signed)
?Tommi Rumps, MD ?Phone: [phone] ? ?Dawn Herring is a 76 y.o. female who presents today for f/u. ? ?HYPERTENSION ?Disease Monitoring ?Home BP Monitoring similar  Chest pain- no    Dyspnea- no ?Medications ?Compliance-  taking losartan, HCTZ, metoprolol ?BMET ?   ?Component Value Date/Time  ? NA 141 03/10/2021 1026  ? NA 142 04/20/2020 1146  ? K 3.5 03/10/2021 1026  ? CL 103 03/10/2021 1026  ? CO2 28 03/10/2021 1026  ? GLUCOSE 92 03/10/2021 1026  ? BUN 23 03/10/2021 1026  ? BUN 15 04/20/2020 1146  ? CREATININE 0.97 03/10/2021 1026  ? CALCIUM 9.1 03/10/2021 1026  ? GFRNONAA 58 (L) 04/20/2020 1146  ? GFRAA 67 04/20/2020 1146  ? ?Atrial fibrillation: Patient notes periodically having palpitations.  She takes flecainide and that knocks it out.  She remains on Eliquis. ? ?Itching: Patient notes she itches all over.  Notes this has been going on for a while.  She wonders if it is related to the Eliquis though she did send a message to cardiology and they noted it would be unlikely.  She does take Benadryl nightly though she is unsure if that helps. ? ?Gout: Patient has reported intermittent pain in her bilateral feet particularly in the arch and around the Achilles tendon.  She wonders if that is related to gout. ? ?Allergies: Patient reports chronic issues with postnasal drip.  No sinus congestion.  She notes some soreness through her eustachian tube area on the left side.  Some diminished hearing right greater than left that is chronic.  No ear fullness.  No tinnitus. ? ?Social History  ? ?Tobacco Use  ?Smoking Status Never  ?Smokeless Tobacco Never  ? ? ?Current Outpatient Medications on File Prior to Visit  ?Medication Sig Dispense Refill  ? Alirocumab (PRALUENT) 150 MG/ML SOAJ Inject 150 mg into the skin every 14 (fourteen) days. 2 mL 11  ? apixaban (ELIQUIS) 5 MG TABS tablet TAKE 1 TABLET BY MOUTH TWICE A DAY 180 tablet 1  ? ezetimibe (ZETIA) 10 MG tablet Take 1 tablet (10 mg total) by mouth daily. 90  tablet 3  ? flecainide (TAMBOCOR) 50 MG tablet TAKE 1 TABLET 2 TIMES DAILY AS NEEDED. 90 tablet 3  ? gabapentin (NEURONTIN) 100 MG capsule Take 1 capsule (100 mg total) by mouth at bedtime. 90 capsule 3  ? metoprolol succinate (TOPROL-XL) 25 MG 24 hr tablet TAKE 1 TABLET BY MOUTH DAILY TAKE WITH OR IMMEDIATELY FOLLOWING A MEAL OK TO START WITH 1/2TAB DAILY 90 tablet 1  ? omeprazole (PRILOSEC) 20 MG capsule TAKE 1 CAPSULE BY MOUTH EVERY DAY 90 capsule 3  ? VASCEPA 1 g capsule TAKE 2 CAPSULES BY MOUTH TWICE A DAY 120 capsule 2  ? ?No current facility-administered medications on file prior to visit.  ? ? ? ?ROS see history of present illness ? ?Objective ? ?Physical Exam ?Vitals:  ? 06/08/21 1040  ?BP: 120/60  ?Pulse: (!) 58  ?Temp: 98.1 ?F (36.7 ?C)  ?SpO2: 98%  ? ? ?BP Readings from Last 3 Encounters:  ?06/08/21 120/60  ?03/10/21 130/80  ?02/17/21 125/80  ? ?Wt Readings from Last 3 Encounters:  ?06/08/21 194 lb 6.4 oz (88.2 kg)  ?05/14/21 190 lb (86.2 kg)  ?03/10/21 196 lb (88.9 kg)  ? ? ?Physical Exam ?Constitutional:   ?   General: She is not in acute distress. ?   Appearance: She is not diaphoretic.  ?HENT:  ?   Right Ear: Tympanic membrane normal.  ?  Left Ear: Tympanic membrane normal.  ?   Ears:  ?   Comments: Small amount of cerumen in the right ear canal ?Cardiovascular:  ?   Rate and Rhythm: Normal rate and regular rhythm.  ?   Heart sounds: Normal heart sounds.  ?Pulmonary:  ?   Effort: Pulmonary effort is normal.  ?   Breath sounds: Normal breath sounds.  ?Musculoskeletal:  ?   Comments: Right foot and ankle with no tenderness, warmth, erythema, or swelling, left heel with mild discomfort at the insertion site of the Achilles tendon, the tendon feels to be intact, no other tenderness in the left foot or ankle  ?Skin: ?   General: Skin is warm and dry.  ?Neurological:  ?   Mental Status: She is alert.  ? ? ? ?Assessment/Plan: Please see individual problem list. ? ?Problem List Items Addressed This Visit    ? ? Allergic rhinitis  ?  I suspect the patient has allergic rhinitis with eustachian tube dysfunction contributing to her symptoms.  She will try either over-the-counter Flonase, Nasonex, or Astepro.  She does not have a history of glaucoma.  If she is not improving with 1 of those she can let us know. ?  ?  ? Arrhythmia - Primary  ?  Intermittent issues with A-fib.  She will continue Eliquis.  Flecainide is managed by cardiology. ?  ?  ? Relevant Medications  ? losartan (COZAAR) 100 MG tablet  ? Essential hypertension  ?  Adequately controlled.  She is going to remain on losartan 100 mg once daily and metoprolol 25 mg daily.  We will discontinue hydrochlorothiazide given the potential for gout flares with this. ?  ?  ? Relevant Medications  ? losartan (COZAAR) 100 MG tablet  ? Gout  ?  I suspect her HCTZ is playing a role in this.  She will discontinue HCTZ.  I do not think her current issue in her left Achilles tendon is related to gout.  I suspect she has tendinitis there.  She will ice the area and wear compression socks.  If not improving she will let us know. ?  ?  ? Itching  ?  I doubt this is related to Eliquis.  We will check hepatic enzymes to evaluate for an underlying cause. ?  ?  ? Relevant Orders  ? Hepatic function panel  ? Prediabetes  ? Relevant Orders  ? HgB A1c  ? ? ? ?Return in about 2 weeks (around 06/22/2021) for nurse BP check, 6 months PCP. ? ?This visit occurred during the SARS-CoV-2 public health emergency.  Safety protocols were in place, including screening questions prior to the visit, additional usage of staff PPE, and extensive cleaning of exam room while observing appropriate contact time as indicated for disinfecting solutions.  ? ? ?Tommi Rumps, MD ?[place] ? ?

## 2021-06-08 NOTE — Assessment & Plan Note (Signed)
Intermittent issues with A-fib.  She will continue Eliquis.  Flecainide is managed by cardiology. ?

## 2021-06-09 ENCOUNTER — Other Ambulatory Visit: Payer: Self-pay | Admitting: Family Medicine

## 2021-06-10 ENCOUNTER — Other Ambulatory Visit: Payer: Self-pay | Admitting: Cardiovascular Disease

## 2021-06-18 DIAGNOSIS — H5213 Myopia, bilateral: Secondary | ICD-10-CM | POA: Diagnosis not present

## 2021-06-18 DIAGNOSIS — Z961 Presence of intraocular lens: Secondary | ICD-10-CM | POA: Diagnosis not present

## 2021-06-22 ENCOUNTER — Encounter: Payer: Self-pay | Admitting: *Deleted

## 2021-06-22 ENCOUNTER — Other Ambulatory Visit: Payer: Self-pay

## 2021-06-22 ENCOUNTER — Ambulatory Visit: Payer: Medicare Other | Admitting: *Deleted

## 2021-06-22 VITALS — BP 140/68 | HR 57 | Resp 16

## 2021-06-22 DIAGNOSIS — I10 Essential (primary) hypertension: Secondary | ICD-10-CM

## 2021-06-22 NOTE — Progress Notes (Signed)
Patient here for nurse visit BP check per order from 06/08/2021 office note after stopping HCTZ  that may contribute to gout flairs.  ? ?Patient reports compliance with prescribed BP medications: yes Patient reported taking losartan and metoprolol daily. Patient reported no symptoms today denies headache , dizziness. Patient did advise nurse that BP has been elevated at home more in the evenings her BP readings have been first stopped HCTZ as high as 190/74 and as low as 138/70. Patient reports she has noted her legs swelling in the evening they go down at night. ? ?Last dose of BP medication: Losartan 100 mg this morning at 7:30 AM, Last dose metoprolol XL 25 mg on 06/21/2021 at 6:30 PM ? ?BP Readings from Last 3 Encounters:  ?06/22/21 (!) 142/68  ?06/08/21 120/60  ?03/10/21 130/80  ? ?Pulse Readings from Last 3 Encounters:  ?06/22/21 (!) 53  ?06/08/21 (!) 58  ?03/10/21 61  ? ? ? ? ?Patient verbalized understanding of instructions.  ? ?Kerin Salen, RN  ?

## 2021-08-08 ENCOUNTER — Other Ambulatory Visit: Payer: Self-pay | Admitting: Cardiovascular Disease

## 2021-08-09 ENCOUNTER — Other Ambulatory Visit: Payer: Self-pay | Admitting: Cardiovascular Disease

## 2021-08-09 NOTE — Telephone Encounter (Signed)
Prescription refill request for Eliquis received. ?Indication: PAF ?Last office visit: 12/18/20 ?Scr: 0.97 on 03/10/21 ?Age:  76 ?Weight: 88.7kg ? ?Based on above findings Eliquis '5mg'$  twice daily is the appropriate dose.  Refill approved. ? ?

## 2021-08-09 NOTE — Telephone Encounter (Signed)
Please review

## 2021-09-15 ENCOUNTER — Encounter: Payer: Self-pay | Admitting: Family Medicine

## 2021-09-15 ENCOUNTER — Ambulatory Visit (INDEPENDENT_AMBULATORY_CARE_PROVIDER_SITE_OTHER): Payer: Medicare Other | Admitting: Family Medicine

## 2021-09-15 VITALS — BP 110/80 | HR 59 | Temp 98.6°F | Ht 65.0 in | Wt 190.8 lb

## 2021-09-15 DIAGNOSIS — M1A2790 Drug-induced chronic gout, unspecified ankle and foot, without tophus (tophi): Secondary | ICD-10-CM

## 2021-09-15 DIAGNOSIS — I10 Essential (primary) hypertension: Secondary | ICD-10-CM

## 2021-09-15 DIAGNOSIS — K921 Melena: Secondary | ICD-10-CM

## 2021-09-15 DIAGNOSIS — R609 Edema, unspecified: Secondary | ICD-10-CM

## 2021-09-15 DIAGNOSIS — G629 Polyneuropathy, unspecified: Secondary | ICD-10-CM

## 2021-09-15 DIAGNOSIS — R7303 Prediabetes: Secondary | ICD-10-CM | POA: Diagnosis not present

## 2021-09-15 DIAGNOSIS — I499 Cardiac arrhythmia, unspecified: Secondary | ICD-10-CM

## 2021-09-15 DIAGNOSIS — M7989 Other specified soft tissue disorders: Secondary | ICD-10-CM

## 2021-09-15 LAB — COMPREHENSIVE METABOLIC PANEL
ALT: 6 U/L (ref 0–35)
AST: 13 U/L (ref 0–37)
Albumin: 3.7 g/dL (ref 3.5–5.2)
Alkaline Phosphatase: 74 U/L (ref 39–117)
BUN: 12 mg/dL (ref 6–23)
CO2: 28 mEq/L (ref 19–32)
Calcium: 9.6 mg/dL (ref 8.4–10.5)
Chloride: 102 mEq/L (ref 96–112)
Creatinine, Ser: 1 mg/dL (ref 0.40–1.20)
GFR: 54.72 mL/min — ABNORMAL LOW (ref >60.00)
Glucose, Bld: 102 mg/dL — ABNORMAL HIGH (ref 70–99)
Potassium: 4.1 mEq/L (ref 3.5–5.1)
Sodium: 139 mEq/L (ref 135–145)
Total Bilirubin: 0.4 mg/dL (ref 0.2–1.2)
Total Protein: 7.2 g/dL (ref 6.0–8.3)

## 2021-09-15 LAB — CBC
HCT: 36.8 % (ref 36.0–46.0)
Hemoglobin: 12.4 g/dL (ref 12.0–15.0)
MCHC: 33.8 g/dL (ref 30.0–36.0)
MCV: 96.4 fl (ref 78.0–100.0)
Platelets: 366 10*3/uL (ref 150.0–400.0)
RBC: 3.81 Mil/uL — ABNORMAL LOW (ref 3.87–5.11)
RDW: 13.4 % (ref 11.5–15.5)
WBC: 5.6 10*3/uL (ref 4.0–10.5)

## 2021-09-15 LAB — TSH: TSH: 4.08 u[IU]/mL (ref 0.35–5.50)

## 2021-09-15 LAB — IBC + FERRITIN
Ferritin: 35.8 ng/mL (ref 10.0–291.0)
Iron: 67 ug/dL (ref 42–145)
Saturation Ratios: 20.4 % (ref 20.0–50.0)
TIBC: 329 ug/dL (ref 250.0–450.0)
Transferrin: 235 mg/dL (ref 212.0–360.0)

## 2021-09-15 LAB — HEMOGLOBIN A1C: Hgb A1c MFr Bld: 6.2 % (ref 4.6–6.5)

## 2021-09-15 LAB — URIC ACID: Uric Acid, Serum: 7.4 mg/dL — ABNORMAL HIGH (ref 2.4–7.0)

## 2021-09-15 LAB — VITAMIN B12: Vitamin B-12: 220 pg/mL (ref 211–911)

## 2021-09-15 MED ORDER — GABAPENTIN 100 MG PO CAPS: 200.0000 mg | ORAL_CAPSULE | Freq: Every day | ORAL | 3 refills | Status: DC

## 2021-09-15 MED ORDER — OMEPRAZOLE 20 MG PO CPDR: 20.0000 mg | DELAYED_RELEASE_CAPSULE | Freq: Two times a day (BID) | ORAL | 3 refills | Status: DC

## 2021-09-15 MED ORDER — PREDNISONE 10 MG PO TABS: ORAL_TABLET | ORAL | 0 refills | Status: AC

## 2021-09-15 NOTE — Assessment & Plan Note (Signed)
Patient has had 2 episodes of this.  Suspect possible gastric ulcer given episodes were preceded by some mild epigastric discomfort.  We will have her take her omeprazole 20 mg twice daily 30 minutes before breakfast and supper.  I will refer her to GI.  We will obtain a CBC and iron studies.

## 2021-09-15 NOTE — Assessment & Plan Note (Signed)
Check A1c. 

## 2021-09-15 NOTE — Assessment & Plan Note (Signed)
Intermittent issues with A-fib.  She will continue Eliquis 5 mg twice daily.  Flecainide is managed by cardiology.  She will also continue on metoprolol succinate 12.5 mg daily.

## 2021-09-15 NOTE — Assessment & Plan Note (Signed)
Numbness and tingling in her feet are likely related to neuropathy.  She will increase her gabapentin to 200 mg nightly.  If that is not beneficial after a few days she can increase it to 300 mg nightly.  She will monitor for drowsiness with this.

## 2021-09-15 NOTE — Patient Instructions (Addendum)
Nice to see you. We are going to treat you for a gout flare with a prednisone taper.  Please take this with food.  Please let me know if this is not improving with the prednisone. We will increase your omeprazole to 20 mg twice daily.  Please take 30 minutes before breakfast and supper. If you have additional melena or you notice any bright red blood per rectum please let us know immediately. GI should contact you to schedule an appointment. We will get lab work today and contact you with the results.

## 2021-09-15 NOTE — Assessment & Plan Note (Signed)
Well-controlled.  She will continue losartan 100 mg daily and metoprolol 12.5 mg daily.

## 2021-09-15 NOTE — Progress Notes (Signed)
Tommi Rumps, MD Phone: [phone]  Dawn Herring is a 76 y.o. female who presents today for f/u.  HYPERTENSION Disease Monitoring Home BP Monitoring not checking Chest pain- no    Dyspnea- no Medications Compliance-  taking losartan, metoprolol.  BMET    Component Value Date/Time   NA 141 03/10/2021 1026   NA 142 04/20/2020 1146   K 3.5 03/10/2021 1026   CL 103 03/10/2021 1026   CO2 28 03/10/2021 1026   GLUCOSE 92 03/10/2021 1026   BUN 23 03/10/2021 1026   BUN 15 04/20/2020 1146   CREATININE 0.97 03/10/2021 1026   CALCIUM 9.1 03/10/2021 1026   GFRNONAA 58 (L) 04/20/2020 1146   GFRAA 67 04/20/2020 1146   A-fib: Patient has been on Eliquis.  She takes flecainide as needed.  She reports her watch has told her she has been in A-fib less than 2% of the time.  She is also on metoprolol.  She has palpitations every 3 weeks or so.  Tarry stools: Patient notes she has had 2 episodes of this.  A couple of days ahead of the tarry stools she will have a little discomfort epigastrically.  The tarry stools last briefly.  She does take omeprazole.  She notes its been at least 10 years since she had an EGD.  Lower extremity swelling: Patient reports lower extremity swelling in her feet and ankles bilaterally left greater than right.  This has been associated with some foot pain as well as some numbness and tingling on the bottoms of her feet.  She does have a history of neuropathy and does take gabapentin 100 mg nightly.  She notes the discomfort in her feet was so bad she had to use a walker for 2 weeks.  She notes occasionally she will get a sharp pain in her feet.  Right thumb pain and swelling: Patient notes this has been going on as well in her interphalangeal joint of her right thumb.  Notes there has been some pain and swelling in that area.  Social History   Tobacco Use  Smoking Status Never  Smokeless Tobacco Never    Current Outpatient Medications on File Prior to Visit   Medication Sig Dispense Refill   Alirocumab (PRALUENT) 150 MG/ML SOAJ Inject 150 mg into the skin every 14 (fourteen) days. 2 mL 11   ELIQUIS 5 MG TABS tablet TAKE 1 TABLET BY MOUTH TWICE A DAY 180 tablet 1   ezetimibe (ZETIA) 10 MG tablet Take 1 tablet (10 mg total) by mouth daily. 90 tablet 3   flecainide (TAMBOCOR) 50 MG tablet TAKE 1 TABLET BY MOUTH TWICE A DAY AS NEEDED 180 tablet 1   losartan (COZAAR) 100 MG tablet Take 1 tablet (100 mg total) by mouth daily. 90 tablet 3   metoprolol succinate (TOPROL-XL) 25 MG 24 hr tablet TAKE 1 TABLET BY MOUTH DAILY TAKE WITH OR IMMEDIATELY FOLLOWING A MEAL OK TO START WITH 1/2TAB DAILY 90 tablet 3   VASCEPA 1 g capsule TAKE 2 CAPSULES BY MOUTH TWICE A DAY (Patient not taking: Reported on 09/15/2021) 120 capsule 2   No current facility-administered medications on file prior to visit.     ROS see history of present illness  Objective  Physical Exam Vitals:   09/15/21 0806  BP: 110/80  Pulse: (!) 59  Temp: 98.6 F (37 C)  SpO2: 98%    BP Readings from Last 3 Encounters:  09/15/21 110/80  06/22/21 140/68  06/08/21 120/60   Wt  Readings from Last 3 Encounters:  09/15/21 190 lb 12.8 oz (86.5 kg)  06/08/21 194 lb 6.4 oz (88.2 kg)  05/14/21 190 lb (86.2 kg)    Physical Exam Constitutional:      General: She is not in acute distress.    Appearance: She is not diaphoretic.  Cardiovascular:     Rate and Rhythm: Normal rate and regular rhythm.     Heart sounds: Normal heart sounds.  Pulmonary:     Effort: Pulmonary effort is normal.     Breath sounds: Normal breath sounds.  Abdominal:     General: Bowel sounds are normal. There is no distension.     Palpations: Abdomen is soft.     Tenderness: There is no abdominal tenderness.  Musculoskeletal:     Comments: Bilateral foot and ankle swelling left greater than right, 1+ pitting, some tenderness over the left first MTP joint, no tenderness elsewhere in her left or right foot or  ankles, right thumb interphalangeal joint with some nodularity and slight tenderness  Skin:    General: Skin is warm and dry.  Neurological:     Mental Status: She is alert.      Assessment/Plan: Please see individual problem list.  Problem List Items Addressed This Visit     Arrhythmia (Chronic)    Intermittent issues with A-fib.  She will continue Eliquis 5 mg twice daily.  Flecainide is managed by cardiology.  She will also continue on metoprolol succinate 12.5 mg daily.      Essential hypertension (Chronic)    Well-controlled.  She will continue losartan 100 mg daily and metoprolol 12.5 mg daily.      Relevant Orders   Comp Met (CMET)   Gout - Primary (Chronic)    I suspect the discomfort and swelling in her feet and right thumb are related to gout though neuropathy could be playing a role as well.  We will check lab work to evaluate for cause of the swelling and her tingling.  I will proceed with a steroid taper to treat suspected gout flare.  Discussed that we may need to consider placing her on a daily gout medication.      Relevant Medications   predniSONE (DELTASONE) 10 MG tablet   Other Relevant Orders   Uric acid   Neuropathy (Chronic)    Numbness and tingling in her feet are likely related to neuropathy.  She will increase her gabapentin to 200 mg nightly.  If that is not beneficial after a few days she can increase it to 300 mg nightly.  She will monitor for drowsiness with this.      Relevant Medications   gabapentin (NEURONTIN) 100 MG capsule   Other Relevant Orders   HgB A1c   B12   Prediabetes (Chronic)    Check A1c.      Relevant Orders   HgB A1c   Melena    Patient has had 2 episodes of this.  Suspect possible gastric ulcer given episodes were preceded by some mild epigastric discomfort.  We will have her take her omeprazole 20 mg twice daily 30 minutes before breakfast and supper.  I will refer her to GI.  We will obtain a CBC and iron studies.       Relevant Medications   omeprazole (PRILOSEC) 20 MG capsule   Other Relevant Orders   CBC   IBC + Ferritin   Ambulatory referral to Gastroenterology   Other Visit Diagnoses     Localized swelling  of both lower extremities       Relevant Orders   TSH   Edema, unspecified type       Relevant Orders   TSH       Return in about 3 weeks (around 10/06/2021) for Leg swelling.  I have spent 40 minutes in the care of this patient regarding History taking, documentation, completion of exam, discussion of plan, placing orders.   Tommi Rumps, MD [place]

## 2021-09-15 NOTE — Assessment & Plan Note (Addendum)
I suspect the discomfort and swelling in her feet and right thumb are related to gout though neuropathy could be playing a role as well.  We will check lab work to evaluate for cause of the swelling and her tingling.  I will proceed with a steroid taper to treat suspected gout flare.  Discussed that we may need to consider placing her on a daily gout medication.

## 2021-09-16 DIAGNOSIS — E669 Obesity, unspecified: Secondary | ICD-10-CM | POA: Diagnosis not present

## 2021-09-16 DIAGNOSIS — R1013 Epigastric pain: Secondary | ICD-10-CM | POA: Diagnosis not present

## 2021-09-16 DIAGNOSIS — K921 Melena: Secondary | ICD-10-CM | POA: Diagnosis not present

## 2021-09-16 DIAGNOSIS — K5730 Diverticulosis of large intestine without perforation or abscess without bleeding: Secondary | ICD-10-CM | POA: Diagnosis not present

## 2021-10-06 ENCOUNTER — Ambulatory Visit: Payer: Medicare Other | Admitting: Family Medicine

## 2021-10-13 ENCOUNTER — Ambulatory Visit (INDEPENDENT_AMBULATORY_CARE_PROVIDER_SITE_OTHER): Payer: Medicare Other | Admitting: Family Medicine

## 2021-10-13 ENCOUNTER — Encounter: Payer: Self-pay | Admitting: Family Medicine

## 2021-10-13 VITALS — BP 130/80 | HR 65 | Temp 98.3°F | Ht 65.0 in | Wt 195.2 lb

## 2021-10-13 DIAGNOSIS — K5730 Diverticulosis of large intestine without perforation or abscess without bleeding: Secondary | ICD-10-CM | POA: Insufficient documentation

## 2021-10-13 DIAGNOSIS — R0609 Other forms of dyspnea: Secondary | ICD-10-CM

## 2021-10-13 DIAGNOSIS — R519 Headache, unspecified: Secondary | ICD-10-CM | POA: Diagnosis not present

## 2021-10-13 DIAGNOSIS — I10 Essential (primary) hypertension: Secondary | ICD-10-CM | POA: Diagnosis not present

## 2021-10-13 DIAGNOSIS — M10079 Idiopathic gout, unspecified ankle and foot: Secondary | ICD-10-CM

## 2021-10-13 DIAGNOSIS — K219 Gastro-esophageal reflux disease without esophagitis: Secondary | ICD-10-CM | POA: Insufficient documentation

## 2021-10-13 DIAGNOSIS — I499 Cardiac arrhythmia, unspecified: Secondary | ICD-10-CM

## 2021-10-13 DIAGNOSIS — R1013 Epigastric pain: Secondary | ICD-10-CM | POA: Insufficient documentation

## 2021-10-13 LAB — COMPREHENSIVE METABOLIC PANEL
ALT: 9 U/L (ref 0–35)
AST: 14 U/L (ref 0–37)
Albumin: 3.9 g/dL (ref 3.5–5.2)
Alkaline Phosphatase: 56 U/L (ref 39–117)
BUN: 20 mg/dL (ref 6–23)
CO2: 26 mEq/L (ref 19–32)
Calcium: 9.1 mg/dL (ref 8.4–10.5)
Chloride: 104 mEq/L (ref 96–112)
Creatinine, Ser: 0.99 mg/dL (ref 0.40–1.20)
GFR: 55.36 mL/min — ABNORMAL LOW (ref >60.00)
Glucose, Bld: 79 mg/dL (ref 70–99)
Potassium: 4 mEq/L (ref 3.5–5.1)
Sodium: 139 mEq/L (ref 135–145)
Total Bilirubin: 0.5 mg/dL (ref 0.2–1.2)
Total Protein: 6.8 g/dL (ref 6.0–8.3)

## 2021-10-13 LAB — CBC
HCT: 36.3 % (ref 36.0–46.0)
Hemoglobin: 12.1 g/dL (ref 12.0–15.0)
MCHC: 33.3 g/dL (ref 30.0–36.0)
MCV: 94.6 fl (ref 78.0–100.0)
Platelets: 249 10*3/uL (ref 150.0–400.0)
RBC: 3.83 Mil/uL — ABNORMAL LOW (ref 3.87–5.11)
RDW: 13.6 % (ref 11.5–15.5)
WBC: 3.8 10*3/uL — ABNORMAL LOW (ref 4.0–10.5)

## 2021-10-13 LAB — TSH: TSH: 2.31 u[IU]/mL (ref 0.35–5.50)

## 2021-10-13 LAB — BRAIN NATRIURETIC PEPTIDE: Pro B Natriuretic peptide (BNP): 321 pg/mL — ABNORMAL HIGH (ref 0.0–100.0)

## 2021-10-13 MED ORDER — ALLOPURINOL 100 MG PO TABS: 100.0000 mg | ORAL_TABLET | Freq: Every day | ORAL | 6 refills | Status: DC

## 2021-10-13 MED ORDER — FUROSEMIDE 20 MG PO TABS: 20.0000 mg | ORAL_TABLET | Freq: Every day | ORAL | 3 refills | Status: DC

## 2021-10-13 MED ORDER — COLCHICINE 0.6 MG PO TABS: 0.6000 mg | ORAL_TABLET | Freq: Every day | ORAL | 0 refills | Status: DC

## 2021-10-13 NOTE — Progress Notes (Signed)
Tommi Rumps, MD Phone: [phone]  Dawn Herring is a 76 y.o. female who presents today for follow-up.  Gout: Patient notes her gout symptoms resolved with taking prednisone.  Headaches: Patient notes for the last few months she has had a left posterior headache that does not occur daily though does occur more frequently than she would like.  She notes no history of headaches.  No injury.  No numbness or weakness.  Notes that oftentimes occurs when she wakes up in the morning.  Hypertension: BP has been 160-170 over less than 80 at home.  She is been on losartan and metoprolol.  She notes no chest pain.  She does report some dyspnea on exertion with the heat when she goes outside.  She notes this is not typical for her.  She does note some swelling right greater than left in her bilateral ankles that has been worse since she has been off of the hydrochlorothiazide.  Atrial fibrillation: Patient notes she had some palpitations this morning she took flecainide and reverted back to sinus rhythm.  She notes she is in A-fib about 2% of the time.  Social History   Tobacco Use  Smoking Status Never  Smokeless Tobacco Never    Current Outpatient Medications on File Prior to Visit  Medication Sig Dispense Refill   Alirocumab (PRALUENT) 150 MG/ML SOAJ Inject 150 mg into the skin every 14 (fourteen) days. 2 mL 11   ELIQUIS 5 MG TABS tablet TAKE 1 TABLET BY MOUTH TWICE A DAY 180 tablet 1   ezetimibe (ZETIA) 10 MG tablet Take 1 tablet (10 mg total) by mouth daily. 90 tablet 3   flecainide (TAMBOCOR) 50 MG tablet TAKE 1 TABLET BY MOUTH TWICE A DAY AS NEEDED 180 tablet 1   gabapentin (NEURONTIN) 100 MG capsule Take 2 capsules (200 mg total) by mouth at bedtime. 180 capsule 3   losartan (COZAAR) 100 MG tablet Take 1 tablet (100 mg total) by mouth daily. 90 tablet 3   metoprolol succinate (TOPROL-XL) 25 MG 24 hr tablet TAKE 1 TABLET BY MOUTH DAILY TAKE WITH OR IMMEDIATELY FOLLOWING A MEAL OK TO  START WITH 1/2TAB DAILY 90 tablet 3   omeprazole (PRILOSEC) 20 MG capsule Take 1 capsule (20 mg total) by mouth 2 (two) times daily before a meal. 60 capsule 3   No current facility-administered medications on file prior to visit.     ROS see history of present illness  Objective  Physical Exam Vitals:   10/13/21 1155  BP: 130/80  Pulse: 65  Temp: 98.3 F (36.8 C)  SpO2: 97%    BP Readings from Last 3 Encounters:  10/13/21 130/80  09/15/21 110/80  06/22/21 140/68   Wt Readings from Last 3 Encounters:  10/13/21 195 lb 3.2 oz (88.5 kg)  09/15/21 190 lb 12.8 oz (86.5 kg)  06/08/21 194 lb 6.4 oz (88.2 kg)    Physical Exam Constitutional:      General: She is not in acute distress.    Appearance: She is not diaphoretic.  Cardiovascular:     Rate and Rhythm: Normal rate and regular rhythm.     Heart sounds: Normal heart sounds.  Pulmonary:     Effort: Pulmonary effort is normal.     Breath sounds: Normal breath sounds.  Musculoskeletal:     Comments: Mild edema noted left ankle slightly greater than right ankle  Skin:    General: Skin is warm and dry.  Neurological:     Mental  Status: She is alert.     Comments: CN 3-12 intact, 5/5 strength in bilateral biceps, triceps, grip, quads, hamstrings, plantar and dorsiflexion, sensation to light touch intact in bilateral UE and LE, normal gait      Assessment/Plan: Please see individual problem list.  Problem List Items Addressed This Visit     Arrhythmia (Chronic)    Intermittent issues with a-fib. She will continue eliquis 5 mg BID. flecanide and metoprolol are managed by cardiology.       Relevant Medications   furosemide (LASIX) 20 MG tablet   Essential hypertension - Primary (Chronic)    Well controlled here though reports it has been elevated at home.  She will continue losartan 100 mg daily and metoprolol 25 mg daily.  We will start Lasix 20 mg daily to help with blood pressure and swelling.  Lab work as  outlined.      Relevant Medications   furosemide (LASIX) 20 MG tablet   Other Relevant Orders   Comp Met (CMET)   Gout (Chronic)    Currently asymptomatic.  Discussed starting on allopurinol.  She notes no personal or family history of Stevens-Johnson syndrome.  We will start allopurinol 100 mg daily.  She will take colchicine 0.6 mg once daily for 90 days while starting on the allopurinol.  Recheck labs in 2 weeks.  Discussed risk of GI upset with the colchicine.      Relevant Medications   allopurinol (ZYLOPRIM) 100 MG tablet   colchicine 0.6 MG tablet   Other Relevant Orders   Basic Metabolic Panel (BMET)   Uric acid   CBC w/Diff   DOE (dyspnea on exertion)    Potentially could be related to atrial fibrillation or another cardiac cause.  We will have her follow-up with cardiology.  We will check labs as outlined.      Relevant Orders   TSH   B Nat Peptide   Comp Met (CMET)   CBC   New onset of headaches after age 40    Patient has had new onset focal headaches.  We will get an MRI to rule out an underlying lesion.  She confirmed she does not have a pacemaker or any metal in her body.      Relevant Medications   allopurinol (ZYLOPRIM) 100 MG tablet   colchicine 0.6 MG tablet   Other Relevant Orders   MR Brain Wo Contrast    Return in about 2 weeks (around 10/27/2021) for Labs, 3 months PCP.   Tommi Rumps, MD [place]

## 2021-10-13 NOTE — Assessment & Plan Note (Signed)
Potentially could be related to atrial fibrillation or another cardiac cause.  We will have her follow-up with cardiology.  We will check labs as outlined.

## 2021-10-13 NOTE — Assessment & Plan Note (Signed)
Well controlled here though reports it has been elevated at home.  She will continue losartan 100 mg daily and metoprolol 25 mg daily.  We will start Lasix 20 mg daily to help with blood pressure and swelling.  Lab work as outlined.

## 2021-10-13 NOTE — Assessment & Plan Note (Signed)
Patient has had new onset focal headaches.  We will get an MRI to rule out an underlying lesion.  She confirmed she does not have a pacemaker or any metal in her body.

## 2021-10-13 NOTE — Patient Instructions (Addendum)
Nice to see you. Somebody should contact you to schedule your MRI of your head. We will start you on Lasix  to see if that helps with your blood pressure and your swelling. We will get lab work today and contact you with the results. We will try to get you a follow-up appointment with cardiology as well.

## 2021-10-13 NOTE — Assessment & Plan Note (Signed)
Intermittent issues with a-fib. She will continue eliquis 5 mg BID. flecanide and metoprolol are managed by cardiology.

## 2021-10-13 NOTE — Assessment & Plan Note (Signed)
Currently asymptomatic.  Discussed starting on allopurinol.  She notes no personal or family history of Stevens-Johnson syndrome.  We will start allopurinol 100 mg daily.  She will take colchicine 0.6 mg once daily for 90 days while starting on the allopurinol.  Recheck labs in 2 weeks.  Discussed risk of GI upset with the colchicine.

## 2021-10-21 ENCOUNTER — Ambulatory Visit
Admission: RE | Admit: 2021-10-21 | Discharge: 2021-10-21 | Disposition: A | Discharge status: Home / Self Care | Payer: Medicare Other | Source: Ambulatory Visit | Attending: Family Medicine | Admitting: Family Medicine

## 2021-10-21 DIAGNOSIS — R519 Headache, unspecified: Secondary | ICD-10-CM | POA: Diagnosis not present

## 2021-10-28 ENCOUNTER — Other Ambulatory Visit (INDEPENDENT_AMBULATORY_CARE_PROVIDER_SITE_OTHER): Payer: Medicare Other

## 2021-10-28 DIAGNOSIS — M10079 Idiopathic gout, unspecified ankle and foot: Secondary | ICD-10-CM | POA: Diagnosis not present

## 2021-10-28 LAB — URIC ACID: Uric Acid, Serum: 6.2 mg/dL (ref 2.4–7.0)

## 2021-10-28 LAB — CBC WITH DIFFERENTIAL/PLATELET
Basophils Absolute: 0 10*3/uL (ref 0.0–0.1)
Basophils Relative: 1.1 % (ref 0.0–3.0)
Eosinophils Absolute: 0.1 10*3/uL (ref 0.0–0.7)
Eosinophils Relative: 1.6 % (ref 0.0–5.0)
HCT: 37.8 % (ref 36.0–46.0)
Hemoglobin: 12.8 g/dL (ref 12.0–15.0)
Lymphocytes Relative: 31.5 % (ref 12.0–46.0)
Lymphs Abs: 1.3 10*3/uL (ref 0.7–4.0)
MCHC: 34 g/dL (ref 30.0–36.0)
MCV: 93.4 fl (ref 78.0–100.0)
Monocytes Absolute: 0.4 10*3/uL (ref 0.1–1.0)
Monocytes Relative: 9.8 % (ref 3.0–12.0)
Neutro Abs: 2.4 10*3/uL (ref 1.4–7.7)
Neutrophils Relative %: 56 % (ref 43.0–77.0)
Platelets: 219 10*3/uL (ref 150.0–400.0)
RBC: 4.04 Mil/uL (ref 3.87–5.11)
RDW: 13.5 % (ref 11.5–15.5)
WBC: 4.2 10*3/uL (ref 4.0–10.5)

## 2021-10-28 LAB — BASIC METABOLIC PANEL
BUN: 15 mg/dL (ref 6–23)
CO2: 28 mEq/L (ref 19–32)
Calcium: 9.6 mg/dL (ref 8.4–10.5)
Chloride: 102 mEq/L (ref 96–112)
Creatinine, Ser: 1.02 mg/dL (ref 0.40–1.20)
GFR: 53.39 mL/min — ABNORMAL LOW (ref >60.00)
Glucose, Bld: 85 mg/dL (ref 70–99)
Potassium: 3.8 mEq/L (ref 3.5–5.1)
Sodium: 141 mEq/L (ref 135–145)

## 2021-11-01 ENCOUNTER — Other Ambulatory Visit: Payer: Self-pay

## 2021-11-01 ENCOUNTER — Encounter: Payer: Self-pay | Admitting: Family Medicine

## 2021-11-01 ENCOUNTER — Telehealth: Payer: Self-pay

## 2021-11-01 DIAGNOSIS — M10079 Idiopathic gout, unspecified ankle and foot: Secondary | ICD-10-CM

## 2021-11-01 MED ORDER — ALLOPURINOL 100 MG PO TABS: 150.0000 mg | ORAL_TABLET | Freq: Every day | ORAL | 6 refills | Status: DC

## 2021-11-01 NOTE — Telephone Encounter (Signed)
Patient states her insurance will not pay for the increase in her allopurinol (ZYLOPRIM) 100 MG tablet from 100-150 MG per day.  Patient states her allopurinol (ZYLOPRIM) 100 MG tablet is not due to renew until September, 2023.  Patient states she would like for Fulton Mole, CMA, to contact her insurance company to confirm the increase in dosage for this medication.  *Patient's preferred pharmacy is [facility] in [place].

## 2021-11-03 ENCOUNTER — Ambulatory Visit (INDEPENDENT_AMBULATORY_CARE_PROVIDER_SITE_OTHER): Payer: Medicare Other | Admitting: Dermatology

## 2021-11-03 DIAGNOSIS — L578 Other skin changes due to chronic exposure to nonionizing radiation: Secondary | ICD-10-CM | POA: Diagnosis not present

## 2021-11-03 DIAGNOSIS — L821 Other seborrheic keratosis: Secondary | ICD-10-CM | POA: Diagnosis not present

## 2021-11-03 DIAGNOSIS — L820 Inflamed seborrheic keratosis: Secondary | ICD-10-CM | POA: Diagnosis not present

## 2021-11-03 NOTE — Progress Notes (Signed)
   Follow-Up Visit   Subjective  Dawn Herring is a 76 y.o. female who presents for the following: Follow-up (The patient has spots, moles and lesions to be evaluated, some may be new or changing and the patient has concerns that these could be cancer. ).  The following portions of the chart were reviewed this encounter and updated as appropriate:   Tobacco  Allergies  Meds  Problems  Med Hx  Surg Hx  Fam Hx     Review of Systems:  No other skin or systemic complaints except as noted in HPI or Assessment and Plan.  Objective  Well appearing patient in no apparent distress; mood and affect are within normal limits.  A focused examination was performed including face, arms . Relevant physical exam findings are noted in the Assessment and Plan.  left nasal ala x 1, right medial tricep x 1, left cheek x 1, right cheek x 1  (4) (4) Stuck-on, waxy, tan-brown papules --Discussed benign etiology and prognosis.    Assessment & Plan  Inflamed seborrheic keratosis (4) left nasal ala x 1, right medial tricep x 1, left cheek x 1, right cheek x 1  (4)  Symptomatic, irritating, patient would like treated.   Destruction of lesion - left nasal ala x 1, right medial tricep x 1, left cheek x 1, right cheek x 1  (4) Complexity: simple   Destruction method: cryotherapy   Informed consent: discussed and consent obtained   Timeout:  patient name, date of birth, surgical site, and procedure verified Lesion destroyed using liquid nitrogen: Yes   Region frozen until ice ball extended beyond lesion: Yes   Outcome: patient tolerated procedure well with no complications   Post-procedure details: wound care instructions given    Seborrheic Keratoses - Stuck-on, waxy, tan-brown papules and/or plaques  - Benign-appearing - Discussed benign etiology and prognosis. - Observe - Call for any changes  Actinic Damage - chronic, secondary to cumulative UV radiation exposure/sun exposure over time -  diffuse scaly erythematous macules with underlying dyspigmentation - Recommend daily broad spectrum sunscreen SPF 30+ to sun-exposed areas, reapply every 2 hours as needed.  - Recommend staying in the shade or wearing long sleeves, sun glasses (UVA+UVB protection) and wide brim hats (4-inch brim around the entire circumference of the hat). - Call for new or changing lesions.    Return in about 5 months (around 04/05/2022) for ISK .  I, Marye Round, CMA, am acting as scribe for Sarina Ser, MD .  Documentation: I have reviewed the above documentation for accuracy and completeness, and I agree with the above.  Sarina Ser, MD

## 2021-11-03 NOTE — Patient Instructions (Addendum)
Cryotherapy Aftercare  Wash gently with soap and water everyday.   Apply Vaseline and Band-Aid daily until healed.     Due to recent changes in healthcare laws, you may see results of your pathology and/or laboratory studies on MyChart before the doctors have had a chance to review them. We understand that in some cases there may be results that are confusing or concerning to you. Please understand that not all results are received at the same time and often the doctors may need to interpret multiple results in order to provide you with the best plan of care or course of treatment. Therefore, we ask that you please give us 2 business days to thoroughly review all your results before contacting the office for clarification. Should we see a critical lab result, you will be contacted sooner.   If You Need Anything After Your Visit  If you have any questions or concerns for your doctor, please call our main line at [phone] and press option 4 to reach your doctor's medical assistant. If no one answers, please leave a voicemail as directed and we will return your call as soon as possible. Messages left after 4 pm will be answered the following business day.   You may also send us a message via MyChart. We typically respond to MyChart messages within 1-2 business days.  For prescription refills, please ask your pharmacy to contact our office. Our fax number is [phone].  If you have an urgent issue when the clinic is closed that cannot wait until the next business day, you can page your doctor at the number below.    Please note that while we do our best to be available for urgent issues outside of office hours, we are not available 24/7.   If you have an urgent issue and are unable to reach us, you may choose to seek medical care at your doctor's office, retail clinic, urgent care center, or emergency room.  If you have a medical emergency, please immediately call 911 or go to the  emergency department.  Pager Numbers  - Dr. Kowalski: [phone]  - Dr. Moye: [phone]  - Dr. Stewart: [phone]  In the event of inclement weather, please call our main line at [phone] for an update on the status of any delays or closures.  Dermatology Medication Tips: Please keep the boxes that topical medications come in in order to help keep track of the instructions about where and how to use these. Pharmacies typically print the medication instructions only on the boxes and not directly on the medication tubes.   If your medication is too expensive, please contact our office at [phone] option 4 or send us a message through MyChart.   We are unable to tell what your co-pay for medications will be in advance as this is different depending on your insurance coverage. However, we may be able to find a substitute medication at lower cost or fill out paperwork to get insurance to cover a needed medication.   If a prior authorization is required to get your medication covered by your insurance company, please allow us 1-2 business days to complete this process.  Drug prices often vary depending on where the prescription is filled and some pharmacies may offer cheaper prices.  The website www.goodrx.com contains coupons for medications through different pharmacies. The prices here do not account for what the cost may be with help from insurance (it may be cheaper with your insurance), but the website can   give you the price if you did not use any insurance.  - You can print the associated coupon and take it with your prescription to the pharmacy.  - You may also stop by our office during regular business hours and pick up a GoodRx coupon card.  - If you need your prescription sent electronically to a different pharmacy, notify our office through [facility] MyChart or by phone at [phone] option 4.     Si Usted Necesita Algo Despus de Su Visita  Tambin puede  enviarnos un mensaje a travs de MyChart. Por lo general respondemos a los mensajes de MyChart en el transcurso de 1 a 2 das hbiles.  Para renovar recetas, por favor pida a su farmacia que se ponga en contacto con nuestra oficina. Nuestro nmero de fax es el [phone].  Si tiene un asunto urgente cuando la clnica est cerrada y que no puede esperar hasta el siguiente da hbil, puede llamar/localizar a su doctor(a) al nmero que aparece a continuacin.   Por favor, tenga en cuenta que aunque hacemos todo lo posible para estar disponibles para asuntos urgentes fuera del horario de oficina, no estamos disponibles las 24 horas del da, los 7 das de la semana.   Si tiene un problema urgente y no puede comunicarse con nosotros, puede optar por buscar atencin mdica  en el consultorio de su doctor(a), en una clnica privada, en un centro de atencin urgente o en una sala de emergencias.  Si tiene una emergencia mdica, por favor llame inmediatamente al 911 o vaya a la sala de emergencias.  Nmeros de bper  - Dr. Kowalski: [phone]  - Dra. Moye: [phone]  - Dra. Stewart: [phone]  En caso de inclemencias del tiempo, por favor llame a nuestra lnea principal al [phone] para una actualizacin sobre el estado de cualquier retraso o cierre.  Consejos para la medicacin en dermatologa: Por favor, guarde las cajas en las que vienen los medicamentos de uso tpico para ayudarle a seguir las instrucciones sobre dnde y cmo usarlos. Las farmacias generalmente imprimen las instrucciones del medicamento slo en las cajas y no directamente en los tubos del medicamento.   Si su medicamento es muy caro, por favor, pngase en contacto con nuestra oficina llamando al [phone] y presione la opcin 4 o envenos un mensaje a travs de MyChart.   No podemos decirle cul ser su copago por los medicamentos por adelantado ya que esto es diferente dependiendo de la cobertura de su seguro.  Sin embargo, es posible que podamos encontrar un medicamento sustituto a menor costo o llenar un formulario para que el seguro cubra el medicamento que se considera necesario.   Si se requiere una autorizacin previa para que su compaa de seguros cubra su medicamento, por favor permtanos de 1 a 2 das hbiles para completar este proceso.  Los precios de los medicamentos varan con frecuencia dependiendo del lugar de dnde se surte la receta y alguna farmacias pueden ofrecer precios ms baratos.  El sitio web www.goodrx.com tiene cupones para medicamentos de diferentes farmacias. Los precios aqu no tienen en cuenta lo que podra costar con la ayuda del seguro (puede ser ms barato con su seguro), pero el sitio web puede darle el precio si no utiliz ningn seguro.  - Puede imprimir el cupn correspondiente y llevarlo con su receta a la farmacia.  - Tambin puede pasar por nuestra oficina durante el horario de atencin regular y recoger una tarjeta de cupones de GoodRx.  -   Si necesita que su receta se enve electrnicamente a una farmacia diferente, informe a nuestra oficina a travs de MyChart de [place] o por telfono llamando al [phone] y presione la opcin 4.  

## 2021-11-05 NOTE — Telephone Encounter (Signed)
I called and explained to the patient to take a whole pill and cut one in half and then she can fill the whole one in September and she understood.  Joakim Huesman,cma

## 2021-11-09 ENCOUNTER — Encounter: Payer: Self-pay | Admitting: Dermatology

## 2021-12-10 ENCOUNTER — Ambulatory Visit (INDEPENDENT_AMBULATORY_CARE_PROVIDER_SITE_OTHER): Payer: Medicare Other | Admitting: Family Medicine

## 2021-12-10 ENCOUNTER — Encounter: Payer: Self-pay | Admitting: Family Medicine

## 2021-12-10 VITALS — BP 120/80 | HR 60 | Temp 97.8°F | Ht 65.0 in | Wt 193.0 lb

## 2021-12-10 DIAGNOSIS — K921 Melena: Secondary | ICD-10-CM | POA: Diagnosis not present

## 2021-12-10 DIAGNOSIS — Z23 Encounter for immunization: Secondary | ICD-10-CM

## 2021-12-10 DIAGNOSIS — E782 Mixed hyperlipidemia: Secondary | ICD-10-CM

## 2021-12-10 DIAGNOSIS — M1A2790 Drug-induced chronic gout, unspecified ankle and foot, without tophus (tophi): Secondary | ICD-10-CM

## 2021-12-10 DIAGNOSIS — I499 Cardiac arrhythmia, unspecified: Secondary | ICD-10-CM | POA: Diagnosis not present

## 2021-12-10 DIAGNOSIS — G629 Polyneuropathy, unspecified: Secondary | ICD-10-CM

## 2021-12-10 LAB — CBC WITH DIFFERENTIAL/PLATELET
Basophils Absolute: 0 10*3/uL (ref 0.0–0.1)
Basophils Relative: 0.6 % (ref 0.0–3.0)
Eosinophils Absolute: 0 10*3/uL (ref 0.0–0.7)
Eosinophils Relative: 0.3 % (ref 0.0–5.0)
HCT: 37.9 % (ref 36.0–46.0)
Hemoglobin: 13 g/dL (ref 12.0–15.0)
Lymphocytes Relative: 27.5 % (ref 12.0–46.0)
Lymphs Abs: 1.4 10*3/uL (ref 0.7–4.0)
MCHC: 34.2 g/dL (ref 30.0–36.0)
MCV: 94.2 fl (ref 78.0–100.0)
Monocytes Absolute: 0.4 10*3/uL (ref 0.1–1.0)
Monocytes Relative: 8.5 % (ref 3.0–12.0)
Neutro Abs: 3.2 10*3/uL (ref 1.4–7.7)
Neutrophils Relative %: 63.1 % (ref 43.0–77.0)
Platelets: 212 10*3/uL (ref 150.0–400.0)
RBC: 4.02 Mil/uL (ref 3.87–5.11)
RDW: 14.8 % (ref 11.5–15.5)
WBC: 5.1 10*3/uL (ref 4.0–10.5)

## 2021-12-10 LAB — BASIC METABOLIC PANEL
BUN: 14 mg/dL (ref 6–23)
CO2: 24 mEq/L (ref 19–32)
Calcium: 9.5 mg/dL (ref 8.4–10.5)
Chloride: 104 mEq/L (ref 96–112)
Creatinine, Ser: 1.01 mg/dL (ref 0.40–1.20)
GFR: 53.98 mL/min — ABNORMAL LOW (ref >60.00)
Glucose, Bld: 95 mg/dL (ref 70–99)
Potassium: 3.4 mEq/L — ABNORMAL LOW (ref 3.5–5.1)
Sodium: 141 mEq/L (ref 135–145)

## 2021-12-10 LAB — URIC ACID: Uric Acid, Serum: 5.2 mg/dL (ref 2.4–7.0)

## 2021-12-10 MED ORDER — GABAPENTIN 100 MG PO CAPS: 300.0000 mg | ORAL_CAPSULE | Freq: Every day | ORAL | 3 refills | Status: DC

## 2021-12-10 NOTE — Assessment & Plan Note (Signed)
She will try gabapentin 300 mg nightly at bedtime.  If she has any excessive drowsiness or other side effects with this she will let us know.

## 2021-12-10 NOTE — Assessment & Plan Note (Signed)
No recurrence.  She will follow the advice of her GI physician.

## 2021-12-10 NOTE — Progress Notes (Signed)
Tommi Rumps, MD Phone: [phone]  Dawn Herring is a 76 y.o. female who presents today for f/u.  HYPERLIPIDEMIA Symptoms Chest pain on exertion:  no  Medications: Compliance- taking praluent Right upper quadrant pain- no  Muscle aches- no Lipid Panel     Component Value Date/Time   CHOL 194 10/27/2020 1125   CHOL 204 (H) 04/20/2020 1146   TRIG 330.0 (H) 10/27/2020 1125   HDL 47.50 10/27/2020 1125   HDL 49 04/20/2020 1146   CHOLHDL 4 10/27/2020 1125   VLDL 66.0 (H) 10/27/2020 1125   LDLCALC 83 04/20/2020 1146   LDLDIRECT 86.0 10/27/2020 1125   LABVLDL 72 (H) 04/20/2020 1146   Atrial fibrillation: Patient is on Eliquis and medical Toprol.  She takes flecainide as needed.  She has had 2 runs of palpitations this week.  She typically gets some shoulder discomfort and then ends up running a strip on her watch that tells her that she is in A-fib.  She takes the flecainide and within 30 to 90 minutes the symptoms resolved.  She notes no bleeding issues with the Eliquis.  Melena: She reports she saw GI and they told her that she was high risk for having an EGD given prior bleeding issues and they wanted to hold off on an EGD at this time.  They wanted her to check her stool if she had any recurrent melena.  She has not had any recurrent melena.  Gout: Patient has been on allopurinol and colchicine.  She notes no recent gout flares.  She sees rheumatology for an initial visit in October.  Neuropathy: This is a chronic ongoing issue.  This occurs in bilateral feet.  She tried gabapentin 200 mg nightly though did not notice much benefit over the 100 mg dose.  She has tried taking the gabapentin during the day though that made her dizzy.  Social History   Tobacco Use  Smoking Status Never  Smokeless Tobacco Never    Current Outpatient Medications on File Prior to Visit  Medication Sig Dispense Refill   Alirocumab (PRALUENT) 150 MG/ML SOAJ Inject 150 mg into the skin every 14  (fourteen) days. 2 mL 11   allopurinol (ZYLOPRIM) 100 MG tablet Take 1.5 tablets (150 mg total) by mouth daily. 30 tablet 6   colchicine 0.6 MG tablet Take 1 tablet (0.6 mg total) by mouth daily. 90 tablet 0   ELIQUIS 5 MG TABS tablet TAKE 1 TABLET BY MOUTH TWICE A DAY 180 tablet 1   ezetimibe (ZETIA) 10 MG tablet Take 1 tablet (10 mg total) by mouth daily. 90 tablet 3   flecainide (TAMBOCOR) 50 MG tablet TAKE 1 TABLET BY MOUTH TWICE A DAY AS NEEDED 180 tablet 1   furosemide (LASIX) 20 MG tablet Take 1 tablet (20 mg total) by mouth daily. 30 tablet 3   losartan (COZAAR) 100 MG tablet Take 1 tablet (100 mg total) by mouth daily. 90 tablet 3   metoprolol succinate (TOPROL-XL) 25 MG 24 hr tablet TAKE 1 TABLET BY MOUTH DAILY TAKE WITH OR IMMEDIATELY FOLLOWING A MEAL OK TO START WITH 1/2TAB DAILY 90 tablet 3   omeprazole (PRILOSEC) 20 MG capsule Take 1 capsule (20 mg total) by mouth 2 (two) times daily before a meal. 60 capsule 3   No current facility-administered medications on file prior to visit.     ROS see history of present illness  Objective  Physical Exam Vitals:   12/10/21 1040  BP: 120/80  Pulse: 60  Temp: 97.8 F (36.6 C)  SpO2: 97%    BP Readings from Last 3 Encounters:  12/10/21 120/80  10/13/21 130/80  09/15/21 110/80   Wt Readings from Last 3 Encounters:  12/10/21 193 lb (87.5 kg)  10/13/21 195 lb 3.2 oz (88.5 kg)  09/15/21 190 lb 12.8 oz (86.5 kg)    Physical Exam Constitutional:      General: She is not in acute distress.    Appearance: She is not diaphoretic.  Cardiovascular:     Rate and Rhythm: Normal rate and regular rhythm.     Heart sounds: Normal heart sounds.  Pulmonary:     Effort: Pulmonary effort is normal.     Breath sounds: Normal breath sounds.  Skin:    General: Skin is warm and dry.  Neurological:     Mental Status: She is alert.      Assessment/Plan: Please see individual problem list.  Problem List Items Addressed This Visit      Arrhythmia - Primary (Chronic)    Continue his intermittent issues with atrial fibrillation.  She will continue Eliquis 5 mg twice daily and metoprolol 25 mg daily.  Flecainide is managed by cardiology.  She will go to the hospital if she has any persistent atrial fibrillation symptoms.      Gout (Chronic)    Continue allopurinol 150 mg daily.  She will complete her 90-day course of colchicine in the near future.  We will check lab work today.      Relevant Orders   Uric acid   Basic Metabolic Panel (BMET)   CBC w/Diff   Hyperlipidemia (Chronic)    She will continue Praluent 150 mg every 14 days and Zetia 10 mg daily.      Neuropathy (Chronic)    She will try gabapentin 300 mg nightly at bedtime.  If she has any excessive drowsiness or other side effects with this she will let us know.      Relevant Medications   gabapentin (NEURONTIN) 100 MG capsule   Melena    No recurrence.  She will follow the advice of her GI physician.      Other Visit Diagnoses     Need for influenza vaccination       Relevant Orders   Flu vaccine HIGH DOSE PF (Fluzone High dose)        Return in about 6 months (around 06/10/2022).   Tommi Rumps, MD [place]

## 2021-12-10 NOTE — Assessment & Plan Note (Signed)
Continue allopurinol 150 mg daily.  She will complete her 90-day course of colchicine in the near future.  We will check lab work today.

## 2021-12-10 NOTE — Assessment & Plan Note (Signed)
She will continue Praluent 150 mg every 14 days and Zetia 10 mg daily.

## 2021-12-10 NOTE — Assessment & Plan Note (Signed)
Continue his intermittent issues with atrial fibrillation.  She will continue Eliquis 5 mg twice daily and metoprolol 25 mg daily.  Flecainide is managed by cardiology.  She will go to the hospital if she has any persistent atrial fibrillation symptoms.

## 2021-12-10 NOTE — Patient Instructions (Signed)
Nice to see you. We will check lab work today and contact you with the results. Please increase your gabapentin to 300 mg nightly.  If you have any excessive drowsiness or other side effects with this dose increase please go back to your previous dosing.

## 2021-12-11 ENCOUNTER — Other Ambulatory Visit: Payer: Self-pay | Admitting: Family Medicine

## 2021-12-11 DIAGNOSIS — K921 Melena: Secondary | ICD-10-CM

## 2021-12-29 NOTE — Progress Notes (Unsigned)
Office Visit Note  Patient: Dawn Herring             Date of Birth: 04/29/1945           MRN: [id number]             PCP: Leone Haven, MD Referring: Leone Haven, MD Visit Date: 12/30/2021 Occupation: '@GUAROCC' @  Subjective:  No chief complaint on file.   History of Present Illness: TEHILLAH CIPRIANI is a 76 y.o. female here for inflammatory arthritis with gout now on allopurinol 150 mg daily. She has a history of previous melena and on long term anticoagulation for Afib. Also with some peripheral neuropathic pain partially improved with gabapentin.***   Labs reviewed 11/2021 Uric acid 5.2 eGFR 53.98  10/2021 Uric acid 6.2 eGFR 53.39  09/2021 Uric acid 7.4 eGFR 55.36   Activities of Daily Living:  Patient reports morning stiffness for *** {minute/hour:19697}.   Patient {ACTIONS;DENIES/REPORTS:21021675::"Denies"} nocturnal pain.  Difficulty dressing/grooming: {ACTIONS;DENIES/REPORTS:21021675::"Denies"} Difficulty climbing stairs: {ACTIONS;DENIES/REPORTS:21021675::"Denies"} Difficulty getting out of chair: {ACTIONS;DENIES/REPORTS:21021675::"Denies"} Difficulty using hands for taps, buttons, cutlery, and/or writing: {ACTIONS;DENIES/REPORTS:21021675::"Denies"}  No Rheumatology ROS completed.   PMFS History:  Patient Active Problem List   Diagnosis Date Noted   Diverticular disease of colon 10/13/2021   Epigastric pain 10/13/2021   Gastroesophageal reflux disease 10/13/2021   New onset of headaches after age 76 10/13/2021   Melena 09/15/2021   Gout 06/08/2021   Itching 06/08/2021   Allergic rhinitis 06/08/2021   Right elbow pain 02/17/2021   Cutaneous skin tags 10/27/2020   Prediabetes 04/29/2020   Left hip pain 01/17/2018   Rotator cuff impingement syndrome of left shoulder 01/17/2018   Neuropathy 01/13/2017   Arrhythmia    Essential hypertension    Non-alcoholic fatty liver disease 03/03/2015   Thyroid nodule 01/26/2015   CAD (coronary artery  disease) 01/26/2015   Tachycardia 07/30/2014   Obesity 09/05/2010   Hyperlipidemia 04/14/2010    Past Medical History:  Diagnosis Date   Chest pain    a. Neg stress tests - 2005, 2008, 2009;  b. 08/2014 Cardiac CT: Ca score 193 (80th%'ile).   Essential hypertension    Hiatal hernia    a. 08/2014 CT chest: large hiatal hernia.   History of DVT (deep vein thrombosis)    Hyperlipidemia    a. statin intolerant;  b. on zetia and repatha.   Palpitations    a. 02/2015 Event Monitor: NSR w/ rare, short episodes of A Tach, rare short runs of SVT < 15 beats.    Family History  Problem Relation Age of Onset   Heart disease Mother    Heart disease Father    Colon cancer Other        Grandparent   Thyroid cancer Brother    Heart disease Brother    Stroke Brother    Hyperlipidemia Sister    Past Surgical History:  Procedure Laterality Date   CATARACT EXTRACTION, BILATERAL     LAPAROSCOPIC APPENDECTOMY N/A 12/02/2015   Procedure: APPENDECTOMY LAPAROSCOPIC;  Surgeon: Clovis Riley, MD;  Location: [facility];  Service: General;  Laterality: N/A;   ROOT CANAL  2013   ROOT CANAL     VAGINAL HYSTERECTOMY     abdominal hysterectomy   Social History   Social History Narrative   Not on file   Immunization History  Administered Date(s) Administered   Fluad Quad(high Dose 65+) 04/08/2019, 04/20/2020, 02/17/2021, 12/10/2021   Hepatitis B, adult 03/17/2015, 04/21/2015, 09/15/2015   Influenza, High  Dose Seasonal PF 01/13/2017, 01/17/2018   Influenza,inj,Quad PF,6+ Mos 03/03/2015, 02/17/2016   Influenza-Unspecified 12/10/2021   Moderna Sars-Covid-2 Vaccination 05/09/2019, 06/03/2019, 01/24/2020, 07/13/2020   Pneumococcal Conjugate-13 07/14/2016   Pneumococcal Polysaccharide-23 04/29/2020   Zoster Recombinat (Shingrix) 06/03/2021, 11/08/2021     Objective: Vital Signs: There were no vitals taken for this visit.   Physical Exam   Musculoskeletal Exam: ***  CDAI Exam: CDAI Score:  -- Patient Global: --; Provider Global: -- Swollen: --; Tender: -- Joint Exam 12/30/2021   No joint exam has been documented for this visit   There is currently no information documented on the homunculus. Go to the Rheumatology activity and complete the homunculus joint exam.  Investigation: No additional findings.  Imaging: No results found.  Recent Labs: Lab Results  Component Value Date   WBC 5.1 12/10/2021   HGB 13.0 12/10/2021   PLT 212.0 12/10/2021   NA 141 12/10/2021   K 3.4 (L) 12/10/2021   CL 104 12/10/2021   CO2 24 12/10/2021   GLUCOSE 95 12/10/2021   BUN 14 12/10/2021   CREATININE 1.01 12/10/2021   BILITOT 0.5 10/13/2021   ALKPHOS 56 10/13/2021   AST 14 10/13/2021   ALT 9 10/13/2021   PROT 6.8 10/13/2021   ALBUMIN 3.9 10/13/2021   CALCIUM 9.5 12/10/2021   GFRAA 67 04/20/2020    Speciality Comments: No specialty comments available.  Procedures:  No procedures performed Allergies: Statins   Assessment / Plan:     Visit Diagnoses: No diagnosis found.  Orders: No orders of the defined types were placed in this encounter.  No orders of the defined types were placed in this encounter.   Face-to-face time spent with patient was *** minutes. Greater than 50% of time was spent in counseling and coordination of care.  Follow-Up Instructions: No follow-ups on file.   Collier Salina, MD  Note - This record has been created using [employer].  Chart creation errors have been sought, but may not always  have been located. Such creation errors do not reflect on  the standard of medical care.

## 2021-12-30 ENCOUNTER — Encounter: Payer: Self-pay | Admitting: Internal Medicine

## 2021-12-30 ENCOUNTER — Ambulatory Visit: Payer: Medicare Other | Attending: Internal Medicine | Admitting: Internal Medicine

## 2021-12-30 DIAGNOSIS — M10079 Idiopathic gout, unspecified ankle and foot: Secondary | ICD-10-CM | POA: Diagnosis not present

## 2021-12-30 MED ORDER — COLCHICINE 0.6 MG PO TABS: 0.6000 mg | ORAL_TABLET | Freq: Every day | ORAL | 0 refills | Status: DC

## 2021-12-30 MED ORDER — ALLOPURINOL 100 MG PO TABS: 150.0000 mg | ORAL_TABLET | Freq: Every day | ORAL | 0 refills | Status: DC

## 2022-01-09 ENCOUNTER — Other Ambulatory Visit: Payer: Self-pay | Admitting: Family Medicine

## 2022-01-09 DIAGNOSIS — I10 Essential (primary) hypertension: Secondary | ICD-10-CM

## 2022-02-02 ENCOUNTER — Other Ambulatory Visit: Payer: Self-pay | Admitting: Cardiovascular Disease

## 2022-02-02 DIAGNOSIS — I480 Paroxysmal atrial fibrillation: Secondary | ICD-10-CM

## 2022-02-02 NOTE — Telephone Encounter (Signed)
Refill request

## 2022-03-01 NOTE — Progress Notes (Unsigned)
Office Visit Note  Patient: Dawn Herring             Date of Birth: 06-10-1945           MRN: [id number]             PCP: Leone Haven, MD Referring: Leone Haven, MD Visit Date: 03/02/2022   Subjective:  No chief complaint on file.   History of Present Illness: CORONA POPOVICH is a 76 y.o. female here for follow up for gout on allopurinol 150 mg daily and colchicine prophylaxis after uric acid at goal in September. ***   Previous HPI 12/30/21 ALEXSYS ESKIN is a 76 y.o. female here for gout now on allopurinol 150 mg daily.  Gout episode started with pain and inflammation in the feet and ankles around June this year.  Subsequently developed increased joint pain and some swelling affecting her thumb and that in the right elbow.  She saw her PCP office and started treatment with prednisone with initial improvement of symptoms.  In follow-up uric acid was found to remain elevated so started allopurinol treatment initially at 100 mg daily.  She was also started on colchicine 0.6 mg daily for flare prophylaxis.  Initial follow-up with repeat uric acid still above 6 so allopurinol titrated to 150 mg and has been taking this dose for less than 1 month but uric acid did improve to 5.2.  So far she has not suffered any repeat episode after the right elbow inflammation.  Prior to onset of the initial flares with the lower extremities she was started on new medication by her cardiologist no other preceding illness or medical events.  She was seen in GI clinic for melena with upper GI ulcers identified and on long term anticoagulation for Afib. She was previously treated with indomethacin for inflammatory arthritis but not taking any NSAIDs at this time. Also with some peripheral neuropathic pain partially improved with gabapentin.  She reports a history of gout with 1 episode of podagra that occurred in her 54s.  She had a hysterectomy at age 13.  No history of kidney stones.  Her metabolic panel  have shown mild renal impairment with estimated GFR in high 50s and stable.     Labs reviewed 11/2021 Uric acid 5.2 eGFR 53.98   10/2021 Uric acid 6.2 eGFR 53.39   09/2021 Uric acid 7.4 eGFR 55.36   No Rheumatology ROS completed.   PMFS History:  Patient Active Problem List   Diagnosis Date Noted   Diverticular disease of colon 10/13/2021   Epigastric pain 10/13/2021   Gastroesophageal reflux disease 10/13/2021   New onset of headaches after age 57 10/13/2021   Melena 09/15/2021   Gout 06/08/2021   Itching 06/08/2021   Allergic rhinitis 06/08/2021   Right elbow pain 02/17/2021   Cutaneous skin tags 10/27/2020   Prediabetes 04/29/2020   Left hip pain 01/17/2018   Rotator cuff impingement syndrome of left shoulder 01/17/2018   Neuropathy 01/13/2017   Arrhythmia    Essential hypertension    Non-alcoholic fatty liver disease 03/03/2015   Thyroid nodule 01/26/2015   CAD (coronary artery disease) 01/26/2015   Tachycardia 07/30/2014   Obesity 09/05/2010   Hyperlipidemia 04/14/2010    Past Medical History:  Diagnosis Date   Chest pain    a. Neg stress tests - 2005, 2008, 2009;  b. 08/2014 Cardiac CT: Ca score 193 (80th%'ile).   Essential hypertension    Hiatal hernia  a. 08/2014 CT chest: large hiatal hernia.   History of DVT (deep vein thrombosis)    Hyperlipidemia    a. statin intolerant;  b. on zetia and repatha.   Palpitations    a. 02/2015 Event Monitor: NSR w/ rare, short episodes of A Tach, rare short runs of SVT < 15 beats.    Family History  Problem Relation Age of Onset   Heart disease Mother    Heart disease Father    Hyperlipidemia Sister    Hypothyroidism Sister    Thyroid cancer Brother    Heart disease Brother    Stroke Brother    Colon cancer Other        Grandparent   Past Surgical History:  Procedure Laterality Date   CATARACT EXTRACTION, BILATERAL     LAPAROSCOPIC APPENDECTOMY N/A 12/02/2015   Procedure: APPENDECTOMY LAPAROSCOPIC;   Surgeon: Clovis Riley, MD;  Location: [facility];  Service: General;  Laterality: N/A;   ROOT CANAL  2013   ROOT CANAL     VAGINAL HYSTERECTOMY     abdominal hysterectomy   Social History   Social History Narrative   Not on file   Immunization History  Administered Date(s) Administered   Fluad Quad(high Dose 65+) 04/08/2019, 04/20/2020, 02/17/2021, 12/10/2021   Hepatitis B, adult 03/17/2015, 04/21/2015, 09/15/2015   Influenza, High Dose Seasonal PF 01/13/2017, 01/17/2018   Influenza,inj,Quad PF,6+ Mos 03/03/2015, 02/17/2016   Influenza-Unspecified 12/10/2021   Moderna Sars-Covid-2 Vaccination 05/09/2019, 06/03/2019, 01/24/2020, 07/13/2020   Pneumococcal Conjugate-13 07/14/2016   Pneumococcal Polysaccharide-23 04/29/2020   Zoster Recombinat (Shingrix) 06/03/2021, 11/08/2021     Objective: Vital Signs: There were no vitals taken for this visit.   Physical Exam   Musculoskeletal Exam: ***  CDAI Exam: CDAI Score: -- Patient Global: --; Provider Global: -- Swollen: --; Tender: -- Joint Exam 03/02/2022   No joint exam has been documented for this visit   There is currently no information documented on the homunculus. Go to the Rheumatology activity and complete the homunculus joint exam.  Investigation: No additional findings.  Imaging: No results found.  Recent Labs: Lab Results  Component Value Date   WBC 5.1 12/10/2021   HGB 13.0 12/10/2021   PLT 212.0 12/10/2021   NA 141 12/10/2021   K 3.4 (L) 12/10/2021   CL 104 12/10/2021   CO2 24 12/10/2021   GLUCOSE 95 12/10/2021   BUN 14 12/10/2021   CREATININE 1.01 12/10/2021   BILITOT 0.5 10/13/2021   ALKPHOS 56 10/13/2021   AST 14 10/13/2021   ALT 9 10/13/2021   PROT 6.8 10/13/2021   ALBUMIN 3.9 10/13/2021   CALCIUM 9.5 12/10/2021   GFRAA 67 04/20/2020    Speciality Comments: No specialty comments available.  Procedures:  No procedures performed Allergies: Statins   Assessment / Plan:     Visit  Diagnoses: No diagnosis found.  ***  Orders: No orders of the defined types were placed in this encounter.  No orders of the defined types were placed in this encounter.    Follow-Up Instructions: No follow-ups on file.   Collier Salina, MD  Note - This record has been created using [employer].  Chart creation errors have been sought, but may not always  have been located. Such creation errors do not reflect on  the standard of medical care.

## 2022-03-02 ENCOUNTER — Encounter: Payer: Self-pay | Admitting: Internal Medicine

## 2022-03-02 ENCOUNTER — Ambulatory Visit: Payer: Medicare Other | Attending: Internal Medicine | Admitting: Internal Medicine

## 2022-03-02 VITALS — BP 141/75 | HR 62 | Resp 15 | Ht 65.0 in | Wt 196.0 lb

## 2022-03-02 DIAGNOSIS — M79671 Pain in right foot: Secondary | ICD-10-CM | POA: Insufficient documentation

## 2022-03-02 DIAGNOSIS — M1A2790 Drug-induced chronic gout, unspecified ankle and foot, without tophus (tophi): Secondary | ICD-10-CM | POA: Insufficient documentation

## 2022-03-03 LAB — COMPLETE METABOLIC PANEL WITH GFR
AG Ratio: 1.5 (calc) (ref 1.0–2.5)
ALT: 17 U/L (ref 6–29)
AST: 23 U/L (ref 10–35)
Albumin: 4.4 g/dL (ref 3.6–5.1)
Alkaline phosphatase (APISO): 77 U/L (ref 37–153)
BUN: 20 mg/dL (ref 7–25)
CO2: 27 mmol/L (ref 20–32)
Calcium: 9.9 mg/dL (ref 8.6–10.4)
Chloride: 103 mmol/L (ref 98–110)
Creat: 0.98 mg/dL (ref 0.60–1.00)
Globulin: 3 g/dL (calc) (ref 1.9–3.7)
Glucose, Bld: 67 mg/dL (ref 65–99)
Potassium: 4.3 mmol/L (ref 3.5–5.3)
Sodium: 140 mmol/L (ref 135–146)
Total Bilirubin: 0.4 mg/dL (ref 0.2–1.2)
Total Protein: 7.4 g/dL (ref 6.1–8.1)
eGFR: 60 mL/min/{1.73_m2} (ref >=60)

## 2022-03-03 LAB — URIC ACID: Uric Acid, Serum: 5.3 mg/dL (ref 2.5–7.0)

## 2022-03-03 LAB — SEDIMENTATION RATE: Sed Rate: 25 mm/h (ref 0–30)

## 2022-03-03 NOTE — Progress Notes (Signed)
Uric acid is 5.3 which remains at goal. Sed rate is normal also suggests against a gout flare as the cause of current foot pain. She can continue the current allopurinol dose. Is she seeing a podiatrist or orthopedist for the foot problems? That might be a good idea since this does not appear to be from her gout.

## 2022-03-11 ENCOUNTER — Ambulatory Visit (INDEPENDENT_AMBULATORY_CARE_PROVIDER_SITE_OTHER): Payer: Medicare Other | Admitting: Family

## 2022-03-11 ENCOUNTER — Encounter: Payer: Self-pay | Admitting: Family

## 2022-03-11 VITALS — BP 128/78 | HR 76 | Temp 97.7°F | Ht 65.0 in | Wt 193.0 lb

## 2022-03-11 DIAGNOSIS — R102 Pelvic and perineal pain: Secondary | ICD-10-CM | POA: Insufficient documentation

## 2022-03-11 DIAGNOSIS — R300 Dysuria: Secondary | ICD-10-CM

## 2022-03-11 LAB — POC URINALSYSI DIPSTICK (AUTOMATED)
Bilirubin, UA: NEGATIVE
Blood, UA: NEGATIVE
Glucose, UA: NEGATIVE
Ketones, UA: NEGATIVE
Leukocytes, UA: NEGATIVE
Nitrite, UA: NEGATIVE
Protein, UA: NEGATIVE
Spec Grav, UA: 1.015 (ref 1.010–1.025)
Urobilinogen, UA: 0.2 E.U./dL
pH, UA: 7 (ref 5.0–8.0)

## 2022-03-11 MED ORDER — NITROFURANTOIN MONOHYD MACRO 100 MG PO CAPS: 100.0000 mg | ORAL_CAPSULE | Freq: Two times a day (BID) | ORAL | 0 refills | Status: DC

## 2022-03-11 NOTE — Progress Notes (Unsigned)
Assessment & Plan:  Suprapubic pressure Assessment & Plan: Patient nontoxic in appearance.  She is afebrile.  Urine is negative for leukocytes, nitrites and blood.  Pending urine culture.  Patient prefers to start antibiotic therapy empirically ahead of urine culture.  Start Macrobid.   Orders: -     Nitrofurantoin Monohyd Macro; Take 1 capsule (100 mg total) by mouth 2 (two) times daily. Take with food.  Dispense: 10 capsule; Refill: 0  Dysuria -     POCT Urinalysis Dipstick (Automated) -     Urine Culture     Return precautions given.   Risks, benefits, and alternatives of the medications and treatment plan prescribed today were discussed, and patient expressed understanding.   Education regarding symptom management and diagnosis given to patient on AVS either electronically or printed.  No follow-ups on file.  Mable Paris, FNP  Subjective:    Patient ID: Dawn Herring, female    DOB: Sep 09, 1945, 76 y.o.   MRN: [id number]  CC: Dawn Herring is a 76 y.o. female who presents today for an acute visit.    HPI: Complains of back pain, suprapubic pain x 3 days ago,  unchanged.   She is concerned for UTI  She doesn't have frequent UTIs  No hematuria, fever, dysuria.    H/o afib. No CP, SOB.  No h/o ckd    Allergies: Statins Current Outpatient Medications on File Prior to Visit  Medication Sig Dispense Refill   Alirocumab (PRALUENT) 150 MG/ML SOAJ Inject 150 mg into the skin every 14 (fourteen) days. (Patient not taking: Reported on 12/30/2021) 2 mL 11   allopurinol (ZYLOPRIM) 100 MG tablet Take 1.5 tablets (150 mg total) by mouth daily. 135 tablet 0   apixaban (ELIQUIS) 5 MG TABS tablet TAKE 1 TABLET BY MOUTH TWICE A DAY 180 tablet 0   CALCIUM-MAGNESIUM-ZINC PO Take by mouth daily.     colchicine 0.6 MG tablet Take 1 tablet (0.6 mg total) by mouth daily. 90 tablet 0   cyanocobalamin (VITAMIN B12) 1000 MCG tablet Take 1,000 mcg by mouth daily.     ezetimibe  (ZETIA) 10 MG tablet Take 1 tablet (10 mg total) by mouth daily. 90 tablet 3   flecainide (TAMBOCOR) 50 MG tablet TAKE 1 TABLET BY MOUTH TWICE A DAY AS NEEDED 180 tablet 1   furosemide (LASIX) 20 MG tablet TAKE 1 TABLET BY MOUTH EVERY DAY 90 tablet 1   gabapentin (NEURONTIN) 100 MG capsule Take 3 capsules (300 mg total) by mouth at bedtime. (Patient not taking: Reported on 03/02/2022) 270 capsule 3   losartan (COZAAR) 100 MG tablet Take 1 tablet (100 mg total) by mouth daily. 90 tablet 3   metoprolol succinate (TOPROL-XL) 25 MG 24 hr tablet TAKE 1 TABLET BY MOUTH DAILY TAKE WITH OR IMMEDIATELY FOLLOWING A MEAL OK TO START WITH 1/2TAB DAILY 90 tablet 3   omeprazole (PRILOSEC) 20 MG capsule TAKE 1 CAPSULE (20 MG TOTAL) BY MOUTH 2 (TWO) TIMES DAILY BEFORE A MEAL. 180 capsule 1   POTASSIUM CHLORIDE PO Take by mouth daily.     No current facility-administered medications on file prior to visit.    Review of Systems  Constitutional:  Negative for chills and fever.  Respiratory:  Negative for cough.   Cardiovascular:  Negative for chest pain and palpitations.  Gastrointestinal:  Negative for nausea and vomiting.  Genitourinary:  Negative for dysuria and frequency.  Musculoskeletal:  Positive for back pain.      Objective:  BP 128/78   Pulse 76   Temp 97.7 F (36.5 C) (Oral)   Ht '5\' 5"'$  (1.651 m)   Wt 193 lb (87.5 kg)   SpO2 98%   BMI 32.12 kg/m   BP Readings from Last 3 Encounters:  03/11/22 128/78  03/02/22 (!) 141/75  12/30/21 125/76   Wt Readings from Last 3 Encounters:  03/11/22 193 lb (87.5 kg)  03/02/22 196 lb (88.9 kg)  12/30/21 195 lb 3.2 oz (88.5 kg)    Physical Exam Vitals reviewed.  Constitutional:      Appearance: She is well-developed.  Cardiovascular:     Rate and Rhythm: Normal rate and regular rhythm.     Pulses: Normal pulses.     Heart sounds: Normal heart sounds.  Pulmonary:     Effort: Pulmonary effort is normal.     Breath sounds: Normal breath  sounds. No wheezing, rhonchi or rales.  Skin:    General: Skin is warm and dry.  Neurological:     Mental Status: She is alert.  Psychiatric:        Speech: Speech normal.        Behavior: Behavior normal.        Thought Content: Thought content normal.

## 2022-03-11 NOTE — Assessment & Plan Note (Signed)
Patient nontoxic in appearance.  She is afebrile.  Urine is negative for leukocytes, nitrites and blood.  Pending urine culture.  Patient prefers to start antibiotic therapy empirically ahead of urine culture.  Start Macrobid.

## 2022-03-11 NOTE — Patient Instructions (Signed)
Start Macrobid, antibiotic  We will notify with results of urine culture  Ensure to take probiotics while on antibiotics and also for 2 weeks after completion. This can either be by eating yogurt daily or taking a probiotic supplement over the counter such as Culturelle.It is important to re-colonize the gut with good bacteria and also to prevent any diarrheal infections associated with antibiotic use.   Urinary Tract Infection, Adult  A urinary tract infection (UTI) is an infection of any part of the urinary tract. The urinary tract includes the kidneys, ureters, bladder, and urethra. These organs make, store, and get rid of urine in the body. An upper UTI affects the ureters and kidneys. A lower UTI affects the bladder and urethra. What are the causes? Most urinary tract infections are caused by bacteria in your genital area around your urethra, where urine leaves your body. These bacteria grow and cause inflammation of your urinary tract. What increases the risk? You are more likely to develop this condition if: You have a urinary catheter that stays in place. You are not able to control when you urinate or have a bowel movement (incontinence). You are female and you: Use a spermicide or diaphragm for birth control. Have low estrogen levels. Are pregnant. You have certain genes that increase your risk. You are sexually active. You take antibiotic medicines. You have a condition that causes your flow of urine to slow down, such as: An enlarged prostate, if you are female. Blockage in your urethra. A kidney stone. A nerve condition that affects your bladder control (neurogenic bladder). Not getting enough to drink, or not urinating often. You have certain medical conditions, such as: Diabetes. A weak disease-fighting system (immunesystem). Sickle cell disease. Gout. Spinal cord injury. What are the signs or symptoms? Symptoms of this condition include: Needing to urinate right  away (urgency). Frequent urination. This may include small amounts of urine each time you urinate. Pain or burning with urination. Blood in the urine. Urine that smells bad or unusual. Trouble urinating. Cloudy urine. Vaginal discharge, if you are female. Pain in the abdomen or the lower back. You may also have: Vomiting or a decreased appetite. Confusion. Irritability or tiredness. A fever or chills. Diarrhea. The first symptom in older adults may be confusion. In some cases, they may not have any symptoms until the infection has worsened. How is this diagnosed? This condition is diagnosed based on your medical history and a physical exam. You may also have other tests, including: Urine tests. Blood tests. Tests for STIs (sexually transmitted infections). If you have had more than one UTI, a cystoscopy or imaging studies may be done to determine the cause of the infections. How is this treated? Treatment for this condition includes: Antibiotic medicine. Over-the-counter medicines to treat discomfort. Drinking enough water to stay hydrated. If you have frequent infections or have other conditions such as a kidney stone, you may need to see a health care provider who specializes in the urinary tract (urologist). In rare cases, urinary tract infections can cause sepsis. Sepsis is a life-threatening condition that occurs when the body responds to an infection. Sepsis is treated in the hospital with IV antibiotics, fluids, and other medicines. Follow these instructions at home:  Medicines Take over-the-counter and prescription medicines only as told by your health care provider. If you were prescribed an antibiotic medicine, take it as told by your health care provider. Do not stop using the antibiotic even if you start to feel better. General  instructions Make sure you: Empty your bladder often and completely. Do not hold urine for long periods of time. Empty your bladder after  sex. Wipe from front to back after urinating or having a bowel movement if you are female. Use each tissue only one time when you wipe. Drink enough fluid to keep your urine pale yellow. Keep all follow-up visits. This is important. Contact a health care provider if: Your symptoms do not get better after 1-2 days. Your symptoms go away and then return. Get help right away if: You have severe pain in your back or your lower abdomen. You have a fever or chills. You have nausea or vomiting. Summary A urinary tract infection (UTI) is an infection of any part of the urinary tract, which includes the kidneys, ureters, bladder, and urethra. Most urinary tract infections are caused by bacteria in your genital area. Treatment for this condition often includes antibiotic medicines. If you were prescribed an antibiotic medicine, take it as told by your health care provider. Do not stop using the antibiotic even if you start to feel better. Keep all follow-up visits. This is important. This information is not intended to replace advice given to you by your health care provider. Make sure you discuss any questions you have with your health care provider. Document Revised: 10/25/2019 Document Reviewed: 10/25/2019 Elsevier Patient Education  [place].

## 2022-03-12 LAB — URINE CULTURE
MICRO NUMBER:: 14321562
SPECIMEN QUALITY:: ADEQUATE

## 2022-04-06 ENCOUNTER — Ambulatory Visit (INDEPENDENT_AMBULATORY_CARE_PROVIDER_SITE_OTHER): Payer: Medicare Other | Admitting: Dermatology

## 2022-04-06 DIAGNOSIS — L918 Other hypertrophic disorders of the skin: Secondary | ICD-10-CM

## 2022-04-06 DIAGNOSIS — L820 Inflamed seborrheic keratosis: Secondary | ICD-10-CM

## 2022-04-06 DIAGNOSIS — L578 Other skin changes due to chronic exposure to nonionizing radiation: Secondary | ICD-10-CM | POA: Diagnosis not present

## 2022-04-06 NOTE — Patient Instructions (Addendum)
Gentle Skin Care Guide  1. Bathe no more than once a day.  2. Avoid bathing in hot water  3. Use a mild soap like Dove, Vanicream, Cetaphil, CeraVe. Can use Lever 2000 or Cetaphil antibacterial soap  4. Use soap only where you need it. On most days, use it under your arms, between your legs, and on your feet. Let the water rinse other areas unless visibly dirty.  5. When you get out of the bath/shower, use a towel to gently blot your skin dry, don't rub it.  6. While your skin is still a little damp, apply a moisturizing cream such as Vanicream, CeraVe, Cetaphil, Eucerin, Sarna lotion or plain Vaseline Jelly. For hands apply Neutrogena Norwegian Hand Cream or Excipial Hand Cream.  7. Reapply moisturizer any time you start to itch or feel dry.  8. Sometimes using free and clear laundry detergents can be helpful. Fabric softener sheets should be avoided. Downy Free & Gentle liquid, or any liquid fabric softener that is free of dyes and perfumes, it acceptable to use  9. If your doctor has given you prescription creams you may apply moisturizers over them          Cryotherapy Aftercare  Wash gently with soap and water everyday.   Apply Vaseline and Band-Aid daily until healed.     Due to recent changes in healthcare laws, you may see results of your pathology and/or laboratory studies on MyChart before the doctors have had a chance to review them. We understand that in some cases there may be results that are confusing or concerning to you. Please understand that not all results are received at the same time and often the doctors may need to interpret multiple results in order to provide you with the best plan of care or course of treatment. Therefore, we ask that you please give us 2 business days to thoroughly review all your results before contacting the office for clarification. Should we see a critical lab result, you will be contacted sooner.   If You Need Anything After Your  Visit  If you have any questions or concerns for your doctor, please call our main line at [phone] and press option 4 to reach your doctor's medical assistant. If no one answers, please leave a voicemail as directed and we will return your call as soon as possible. Messages left after 4 pm will be answered the following business day.   You may also send us a message via MyChart. We typically respond to MyChart messages within 1-2 business days.  For prescription refills, please ask your pharmacy to contact our office. Our fax number is [phone].  If you have an urgent issue when the clinic is closed that cannot wait until the next business day, you can page your doctor at the number below.    Please note that while we do our best to be available for urgent issues outside of office hours, we are not available 24/7.   If you have an urgent issue and are unable to reach us, you may choose to seek medical care at your doctor's office, retail clinic, urgent care center, or emergency room.  If you have a medical emergency, please immediately call 911 or go to the emergency department.  Pager Numbers  - Dr. Kowalski: [phone]  - Dr. Moye: [phone]  - Dr. Stewart: [phone]  In the event of inclement weather, please call our main line at [phone] for an update on the status of   any delays or closures.  Dermatology Medication Tips: Please keep the boxes that topical medications come in in order to help keep track of the instructions about where and how to use these. Pharmacies typically print the medication instructions only on the boxes and not directly on the medication tubes.   If your medication is too expensive, please contact our office at [phone] option 4 or send us a message through MyChart.   We are unable to tell what your co-pay for medications will be in advance as this is different depending on your insurance coverage. However, we may be able to find a  substitute medication at lower cost or fill out paperwork to get insurance to cover a needed medication.   If a prior authorization is required to get your medication covered by your insurance company, please allow us 1-2 business days to complete this process.  Drug prices often vary depending on where the prescription is filled and some pharmacies may offer cheaper prices.  The website www.goodrx.com contains coupons for medications through different pharmacies. The prices here do not account for what the cost may be with help from insurance (it may be cheaper with your insurance), but the website can give you the price if you did not use any insurance.  - You can print the associated coupon and take it with your prescription to the pharmacy.  - You may also stop by our office during regular business hours and pick up a GoodRx coupon card.  - If you need your prescription sent electronically to a different pharmacy, notify our office through [facility] MyChart or by phone at [phone] option 4.     Si Usted Necesita Algo Despus de Su Visita  Tambin puede enviarnos un mensaje a travs de MyChart. Por lo general respondemos a los mensajes de MyChart en el transcurso de 1 a 2 das hbiles.  Para renovar recetas, por favor pida a su farmacia que se ponga en contacto con nuestra oficina. Nuestro nmero de fax es el [phone].  Si tiene un asunto urgente cuando la clnica est cerrada y que no puede esperar hasta el siguiente da hbil, puede llamar/localizar a su doctor(a) al nmero que aparece a continuacin.   Por favor, tenga en cuenta que aunque hacemos todo lo posible para estar disponibles para asuntos urgentes fuera del horario de oficina, no estamos disponibles las 24 horas del da, los 7 das de la semana.   Si tiene un problema urgente y no puede comunicarse con nosotros, puede optar por buscar atencin mdica  en el consultorio de su doctor(a), en una clnica privada, en un  centro de atencin urgente o en una sala de emergencias.  Si tiene una emergencia mdica, por favor llame inmediatamente al 911 o vaya a la sala de emergencias.  Nmeros de bper  - Dr. Kowalski: [phone]  - Dra. Moye: [phone]  - Dra. Stewart: [phone]  En caso de inclemencias del tiempo, por favor llame a nuestra lnea principal al [phone] para una actualizacin sobre el estado de cualquier retraso o cierre.  Consejos para la medicacin en dermatologa: Por favor, guarde las cajas en las que vienen los medicamentos de uso tpico para ayudarle a seguir las instrucciones sobre dnde y cmo usarlos. Las farmacias generalmente imprimen las instrucciones del medicamento slo en las cajas y no directamente en los tubos del medicamento.   Si su medicamento es muy caro, por favor, pngase en contacto con nuestra oficina llamando al [phone] y presione la   opcin 4 o envenos un mensaje a travs de MyChart.   No podemos decirle cul ser su copago por los medicamentos por adelantado ya que esto es diferente dependiendo de la cobertura de su seguro. Sin embargo, es posible que podamos encontrar un medicamento sustituto a menor costo o llenar un formulario para que el seguro cubra el medicamento que se considera necesario.   Si se requiere una autorizacin previa para que su compaa de seguros cubra su medicamento, por favor permtanos de 1 a 2 das hbiles para completar este proceso.  Los precios de los medicamentos varan con frecuencia dependiendo del lugar de dnde se surte la receta y alguna farmacias pueden ofrecer precios ms baratos.  El sitio web www.goodrx.com tiene cupones para medicamentos de diferentes farmacias. Los precios aqu no tienen en cuenta lo que podra costar con la ayuda del seguro (puede ser ms barato con su seguro), pero el sitio web puede darle el precio si no utiliz ningn seguro.  - Puede imprimir el cupn correspondiente y llevarlo con su receta  a la farmacia.  - Tambin puede pasar por nuestra oficina durante el horario de atencin regular y recoger una tarjeta de cupones de GoodRx.  - Si necesita que su receta se enve electrnicamente a una farmacia diferente, informe a nuestra oficina a travs de MyChart de [place] o por telfono llamando al [phone] y presione la opcin 4.  

## 2022-04-06 NOTE — Progress Notes (Unsigned)
   Follow-Up Visit   Subjective  Dawn Herring is a 77 y.o. female who presents for the following: Follow-up (5 months f/u The patient has spots, moles and lesions to be evaluated, some may be new or changing and the patient has concerns that these could be cancer. ).  The following portions of the chart were reviewed this encounter and updated as appropriate:   Tobacco  Allergies  Meds  Problems  Med Hx  Surg Hx  Fam Hx     Review of Systems:  No other skin or systemic complaints except as noted in HPI or Assessment and Plan.  Objective  Well appearing patient in no apparent distress; mood and affect are within normal limits.  A focused examination was performed including face,neck . Relevant physical exam findings are noted in the Assessment and Plan.  right cheek x 2, left cheek x 1  (3) (3) Stuck-on, waxy, tan-brown papules -- Discussed benign etiology and prognosis.    Assessment & Plan  Inflamed seborrheic keratosis (3) right cheek x 2, left cheek x 1  (3)  Symptomatic, irritating, patient would like treated.   Destruction of lesion - right cheek x 2, left cheek x 1  (3) Complexity: simple   Destruction method: cryotherapy   Informed consent: discussed and consent obtained   Timeout:  patient name, date of birth, surgical site, and procedure verified Lesion destroyed using liquid nitrogen: Yes   Region frozen until ice ball extended beyond lesion: Yes   Outcome: patient tolerated procedure well with no complications   Post-procedure details: wound care instructions given    Actinic skin damage  Cutaneous skin tags   Acrochordons (Skin Tags) - Removal desired by patient Symptomatic, irritating, patient would like treated. - Fleshy, skin-colored pedunculated papules - Benign appearing.  - Patient desires removal. Reviewed that this is not covered by insurance and they will be charged a cosmetic fee for removal. Patient signed non-covered consent.  - Prior to  procedure, discussed risks of blister formation, small wound, skin dyspigmentation, or rare scar following cryotherapy.  PROCEDURE - Cryotherapy was performed to affected areas. The procedure was tolerated well. Wound care was reviewed with the patient. They were advised to call with any concerns. Total number of treated acrochordons 10   Actinic Damage - chronic, secondary to cumulative UV radiation exposure/sun exposure over time - diffuse scaly erythematous macules with underlying dyspigmentation - Recommend daily broad spectrum sunscreen SPF 30+ to sun-exposed areas, reapply every 2 hours as needed.  - Recommend staying in the shade or wearing long sleeves, sun glasses (UVA+UVB protection) and wide brim hats (4-inch brim around the entire circumference of the hat). - Call for new or changing lesions.  Return if symptoms worsen or fail to improve.  IMarye Round, CMA, am acting as scribe for Sarina Ser, MD .  Documentation: I have reviewed the above documentation for accuracy and completeness, and I agree with the above.  Sarina Ser, MD

## 2022-04-07 ENCOUNTER — Other Ambulatory Visit: Payer: Self-pay | Admitting: Internal Medicine

## 2022-04-07 ENCOUNTER — Other Ambulatory Visit: Payer: Self-pay | Admitting: Family Medicine

## 2022-04-07 ENCOUNTER — Other Ambulatory Visit: Payer: Self-pay | Admitting: Cardiovascular Disease

## 2022-04-07 ENCOUNTER — Encounter: Payer: Self-pay | Admitting: Dermatology

## 2022-04-07 DIAGNOSIS — M10079 Idiopathic gout, unspecified ankle and foot: Secondary | ICD-10-CM

## 2022-04-07 DIAGNOSIS — I10 Essential (primary) hypertension: Secondary | ICD-10-CM

## 2022-04-07 DIAGNOSIS — I480 Paroxysmal atrial fibrillation: Secondary | ICD-10-CM

## 2022-04-07 NOTE — Telephone Encounter (Signed)
Please schedule overdue office visit for refills. Thank you! 

## 2022-04-07 NOTE — Telephone Encounter (Signed)
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 Sed rate is normal also suggests against a gout flare as the cause of current foot pain. She can continue the current allopurinol dose.   Okay to refill Allopurinol and Colchicine?

## 2022-04-07 NOTE — Telephone Encounter (Signed)
LVM

## 2022-04-08 NOTE — Telephone Encounter (Signed)
Prescription refill request for Eliquis received. Indication: Afib  Last office visit: 12/18/20 Rockey Situ)  Scr: 0.98 (03/02/22)  Age: 77 Weight: 87.5kg  Appropriate dose and refill sent to requested pharmacy.

## 2022-04-08 NOTE — Telephone Encounter (Signed)
Refill request Last seen 11/2020

## 2022-04-09 ENCOUNTER — Other Ambulatory Visit: Payer: Self-pay | Admitting: Family Medicine

## 2022-04-09 DIAGNOSIS — M10079 Idiopathic gout, unspecified ankle and foot: Secondary | ICD-10-CM

## 2022-04-11 NOTE — Telephone Encounter (Signed)
Pt scheduled for 05/11/2022

## 2022-04-22 ENCOUNTER — Other Ambulatory Visit: Payer: Self-pay | Admitting: Family Medicine

## 2022-04-22 DIAGNOSIS — I10 Essential (primary) hypertension: Secondary | ICD-10-CM

## 2022-04-27 ENCOUNTER — Other Ambulatory Visit: Payer: Self-pay | Admitting: Cardiovascular Disease

## 2022-05-10 NOTE — Progress Notes (Unsigned)
Cardiology Office Note  Date:  05/11/2022   ID:  Dawn Herring, DOB February 23, 1946, MRN [id number]  PCP:  Dawn Haven, MD   Chief Complaint  Patient presents with   12 month follow up     Patient having gout issues with elevated uric acid, has been off the Praluent and is not sure if she needs to start the Praluent back and has an itchy rash on arms and behind knees that keeps her up at night. Medications reviewed by the patient verbally.     HPI:  Dawn Herring is a very pleasant 77 year old woman with past medical history of  Asymptomatic paroxysmal atrial fibrillation hypertension,  familial hypercholesterolemia,  chest pain and atrial tachycardia, dating back >10 years with negative stress tests Prior CT coronary calcium score  190,  started on more aggressive cholesterol regiment at that time PVCs seen on prior EKGs Event monitor December 2016 showing APCs, runs of  atrial tachycardia Who presents for routine followup of her hyperlipidemia hypertension, paroxysmal atrial fibrillation  LOV 9/22 Bad gout the past year, right elbow Better with prednisone Also in feet /legs Elevated uric acid, allopurinol, colchicine Seen by Rheumatology  Off HCTZ,, now on lasix Off praluent, HCTZ, vascepa Gout better over the summer  Rare atrial fibrillation, happens when laying down Asymptomatic bradycardia, on metoprolol succinate 25 every afternoon Takes flecainide  Atrial fib last night, 2 hrs, woke her up midnight Took flecainide, tylenol for neck pain Resolved after 2 hrs  Sedentary Neuropathy  EKG personally reviewed by myself on todays visit Shows sinus bradycardia rate 48 bpm T wave abnormality precordial leads, 1 and aVL, PACs  Other past medical history reviewed Zio monitor 06/2020, reviewed  Normal sinus rhythm with paroxysmal atrial fibrillation. Patient had a min HR of 43 bpm, max HR of 177 bpm, and avg HR of 57 bpm.    Atrial Fibrillation occurred (<1% burden),  ranging from 84-177 bpm (avg of 128 bpm), the longest  lasting 1 hour 52 mins with an avg rate of 128 bpm.  Atrial Fibrillation was detected within +/- 45 seconds of symptomatic patient event(s).  Numerous episodes of atrial fibrillation were not triggered.    Isolated SVEs were rare (<1.0%), SVE Couplets were rare (<1.0%), and SVE Triplets were rare (<1.0%). Isolated VEs  were rare (<1.0%, 907), VE Triplets were rare (<1.0%, 1), and no VE Couplets were present. Ventricular Bigeminy and Trigeminy were present.   Other past medical history reviewed  Cataract surgery: sept 26th, 2017 Second cataract surgery  last Thursday, anesthesia noted irregular rhythm She did not notice any symptoms Reports that she has not felt well since that time, feels weak   Off pralunt, changed to repatha,  She received samples from the office, did not the to fill her prescription despite aggressive marketing from specialists pharmacy. Was told that they would cancel her prescription. She was paying $600 per month and reports that she was able to afford this. Prescription now canceled? Has been without medication for several months   Previous 30 day monitor again reviewed with her showing runs of atrial tachycardia, run of SVT, APCs, PVCs   Previously had significant myalgias on the fluvastatin  Symptoms improved by holding the medication Previously had gallbladder attack  and in the setting of severe pain, had tachycardia concerning for arrhythmia  Prior episode of tachycardia concerning for arrhythmia in the setting of back pain in the past   Strong family history of familial hypercholesterolemia.  She has  tried numerous cholesterol medications in the past including Crestor, Lipitor, lovastatin. She did not tolerate these secondary to side effects.     PMH:   has a past medical history of Chest pain, Essential hypertension, Hiatal hernia, History of DVT (deep vein thrombosis), Hyperlipidemia, and  Palpitations.  PSH:    Past Surgical History:  Procedure Laterality Date   CATARACT EXTRACTION, BILATERAL     LAPAROSCOPIC APPENDECTOMY N/A 12/02/2015   Procedure: APPENDECTOMY LAPAROSCOPIC;  Surgeon: Dawn Riley, MD;  Location: [facility];  Service: General;  Laterality: N/A;   ROOT CANAL  2013   ROOT CANAL     VAGINAL HYSTERECTOMY     abdominal hysterectomy    Current Outpatient Medications  Medication Sig Dispense Refill   allopurinol (ZYLOPRIM) 100 MG tablet TAKE 1 TABLET BY MOUTH EVERY DAY 90 tablet 2   apixaban (ELIQUIS) 5 MG TABS tablet TAKE 1 TABLET BY MOUTH TWICE A DAY 180 tablet 1   CALCIUM-MAGNESIUM-ZINC PO Take by mouth daily.     colchicine 0.6 MG tablet TAKE 1 TABLET BY MOUTH EVERY DAY 90 tablet 0   cyanocobalamin (VITAMIN B12) 1000 MCG tablet Take 1,000 mcg by mouth daily.     ezetimibe (ZETIA) 10 MG tablet TAKE 1 TABLET BY MOUTH EVERY DAY 30 tablet 0   flecainide (TAMBOCOR) 50 MG tablet TAKE 1 TABLET BY MOUTH TWICE A DAY AS NEEDED 180 tablet 1   furosemide (LASIX) 20 MG tablet TAKE 1 TABLET BY MOUTH EVERY DAY 90 tablet 1   losartan (COZAAR) 100 MG tablet TAKE 1 TABLET BY MOUTH EVERY DAY 90 tablet 3   metoprolol succinate (TOPROL-XL) 25 MG 24 hr tablet TAKE 1 TABLET BY MOUTH DAILY TAKE WITH OR IMMEDIATELY FOLLOWING A MEAL OK TO START WITH 1/2TAB DAILY 15 tablet 0   omeprazole (PRILOSEC) 20 MG capsule TAKE 1 CAPSULE (20 MG TOTAL) BY MOUTH 2 (TWO) TIMES DAILY BEFORE A MEAL. 180 capsule 1   POTASSIUM CHLORIDE PO Take by mouth daily.     Alirocumab (PRALUENT) 150 MG/ML SOAJ Inject 150 mg into the skin every 14 (fourteen) days. (Patient not taking: Reported on 12/30/2021) 2 mL 11   gabapentin (NEURONTIN) 100 MG capsule Take 3 capsules (300 mg total) by mouth at bedtime. (Patient not taking: Reported on 03/02/2022) 270 capsule 3   nitrofurantoin, macrocrystal-monohydrate, (MACROBID) 100 MG capsule Take 1 capsule (100 mg total) by mouth 2 (two) times daily. Take with food. (Patient  not taking: Reported on 05/11/2022) 10 capsule 0   No current facility-administered medications for this visit.     Allergies:   Statins   Social History:  The patient  reports that she has never smoked. She has never been exposed to tobacco smoke. She has never used smokeless tobacco. She reports that she does not drink alcohol and does not use drugs.   Family History:   family history includes Colon cancer in an other family member; Heart disease in her brother, father, and mother; Hyperlipidemia in her sister; Hypothyroidism in her sister; Stroke in her brother; Thyroid cancer in her brother.    Review of Systems: Review of Systems  Constitutional: Negative.   HENT: Negative.    Respiratory: Negative.    Cardiovascular:        Episodes of tachycardia  Gastrointestinal: Negative.   Musculoskeletal: Negative.   Neurological: Negative.   Psychiatric/Behavioral: Negative.    All other systems reviewed and are negative.   PHYSICAL EXAM: VS:  BP (!) 150/70 (BP Location:  Left Arm, Patient Position: Sitting, Cuff Size: Normal)   Pulse (!) 48   Ht 5' 5"$  (1.651 m)   Wt 194 lb 4 oz (88.1 kg)   SpO2 98%   BMI 32.32 kg/m  , BMI Body mass index is 32.32 kg/m. Constitutional:  oriented to person, place, and time. No distress.  HENT:  Head: Grossly normal Eyes:  no discharge. No scleral icterus.  Neck: No JVD, no carotid bruits  Cardiovascular: Regular rate and rhythm, no murmurs appreciated Pulmonary/Chest: Clear to auscultation bilaterally, no wheezes or rails Abdominal: Soft.  no distension.  no tenderness.  Musculoskeletal: Normal range of motion Neurological:  normal muscle tone. Coordination normal. No atrophy Skin: Skin warm and dry Psychiatric: normal affect, pleasant  Recent Labs: 10/13/2021: Pro B Natriuretic peptide (BNP) 321.0; TSH 2.31 12/10/2021: Hemoglobin 13.0; Platelets 212.0 03/02/2022: ALT 17; BUN 20; Creat 0.98; Potassium 4.3; Sodium 140    Lipid Panel Lab  Results  Component Value Date   CHOL 194 10/27/2020   HDL 47.50 10/27/2020   LDLCALC 83 04/20/2020   TRIG 330.0 (H) 10/27/2020    Wt Readings from Last 3 Encounters:  05/11/22 194 lb 4 oz (88.1 kg)  03/11/22 193 lb (87.5 kg)  03/02/22 196 lb (88.9 kg)     ASSESSMENT AND PLAN:  Pure hypercholesterolemia - Reports that she continues on Zetia but is not taking Vascepa or Praluent out of concern this was contributing to her rash and gout Recommend repeat lipid panel when she sees primary care next month Would consider retrial of Praluent if numbers run high Recommend regular walking program  Coronary artery disease involving native coronary artery of native heart without angina pectoris -   CT coronary calcium score 190 Currently  only taking Zetia Statin intolerance Consideration of retrial of Praluent depending on lab results  Atrial fibrillation, paroxysmal Eliquis 5 twice daily On metoprolol qpm, asymptomatic bradycardia For breakthrough, flecainde 50 to 100   Essential hypertension Blood pressure is well controlled on today's visit. No changes made to the medications.  Leg swelling Taking Lasix daily, HCTZ previously held out of concern for gout    Total encounter time more than 30 minutes  Greater than 50% was spent in counseling and coordination of care with the patient      No orders of the defined types were placed in this encounter.    Signed, Esmond Plants, M.D., Ph.D. 05/11/2022  [place], [place]

## 2022-05-11 ENCOUNTER — Ambulatory Visit: Payer: Medicare Other | Attending: Cardiovascular Disease | Admitting: Cardiovascular Disease

## 2022-05-11 ENCOUNTER — Encounter: Payer: Self-pay | Admitting: Cardiovascular Disease

## 2022-05-11 VITALS — BP 145/70 | HR 48 | Ht 65.0 in | Wt 194.2 lb

## 2022-05-11 DIAGNOSIS — I480 Paroxysmal atrial fibrillation: Secondary | ICD-10-CM | POA: Diagnosis not present

## 2022-05-11 DIAGNOSIS — I10 Essential (primary) hypertension: Secondary | ICD-10-CM | POA: Insufficient documentation

## 2022-05-11 DIAGNOSIS — E782 Mixed hyperlipidemia: Secondary | ICD-10-CM | POA: Insufficient documentation

## 2022-05-11 DIAGNOSIS — I479 Paroxysmal tachycardia, unspecified: Secondary | ICD-10-CM

## 2022-05-11 DIAGNOSIS — I25118 Atherosclerotic heart disease of native coronary artery with other forms of angina pectoris: Secondary | ICD-10-CM | POA: Diagnosis not present

## 2022-05-11 MED ORDER — FLECAINIDE ACETATE 50 MG PO TABS: ORAL_TABLET | ORAL | 3 refills | Status: DC

## 2022-05-11 MED ORDER — EZETIMIBE 10 MG PO TABS: 10.0000 mg | ORAL_TABLET | Freq: Every day | ORAL | 3 refills | Status: DC

## 2022-05-11 MED ORDER — LOSARTAN POTASSIUM 100 MG PO TABS: 100.0000 mg | ORAL_TABLET | Freq: Every day | ORAL | 3 refills | Status: DC

## 2022-05-11 MED ORDER — METOPROLOL SUCCINATE ER 25 MG PO TB24: ORAL_TABLET | ORAL | 3 refills | Status: DC

## 2022-05-11 NOTE — Patient Instructions (Signed)
Medication Instructions:  No changes  If you need a refill on your cardiac medications before your next appointment, please call your pharmacy.   Lab work: No new labs needed  Testing/Procedures: No new testing needed  Follow-Up: At [facility], you and your health needs are our priority.  As part of our continuing mission to provide you with exceptional heart care, we have created designated Provider Care Teams.  These Care Teams include your primary Cardiologist (physician) and Advanced Practice Providers (APPs -  Physician Assistants and Nurse Practitioners) who all work together to provide you with the care you need, when you need it.  You will need a follow up appointment in 12 months  Providers on your designated Care Team:   Christopher Berge, NP Ryan Dunn, PA-C Cadence Furth, PA-C  COVID-19 Vaccine Information can be found at: [email or web address] For questions related to vaccine distribution or appointments, please email [email or web address] or call [phone].   

## 2022-05-16 ENCOUNTER — Ambulatory Visit (INDEPENDENT_AMBULATORY_CARE_PROVIDER_SITE_OTHER): Payer: Medicare Other

## 2022-05-16 VITALS — Ht 65.0 in | Wt 194.0 lb

## 2022-05-16 DIAGNOSIS — Z0000 Encounter for general adult medical examination without abnormal findings: Secondary | ICD-10-CM

## 2022-05-16 NOTE — Progress Notes (Signed)
Subjective:   Dawn Herring is a 77 y.o. female who presents for Medicare Annual (Subsequent) preventive examination.  Review of Systems    No ROS.  Medicare Wellness Virtual Visit.  Visual/audio telehealth visit, UTA vital signs.   See social history for additional risk factors.   Cardiac Risk Factors include: advanced age (>76mn, >>42women)     Objective:    Today's Vitals   05/16/22 1137  Weight: 194 lb (88 kg)  Height: 5' 5"$  (1.651 m)   Body mass index is 32.28 kg/m.     05/16/2022   11:38 AM 05/14/2021   11:34 AM 04/24/2020   11:47 AM 02/07/2018    2:34 PM 08/25/2017    4:31 PM 08/24/2016    4:20 PM 12/02/2015   11:52 AM  Advanced Directives  Does Patient Have a Medical Advance Directive? Yes No Yes Yes Yes Yes No  Type of AParamedicof AG[place]coeLiving will  HRhin[place]Living will HCasa ConejoLiving will;Out of facility DNR (pink MOST or yellow form) HPenceLiving will HRiddlevilleLiving will   Does patient want to make changes to medical advance directive? No - Patient declined  No - Patient declined No - Patient declined No - Patient declined No - Patient declined   Copy of [place]in Chart? Yes - validated most recent copy scanned in chart (See row information)  No - copy requested  No - copy requested No - copy requested   Would patient like information on creating a medical advance directive?  No - Patient declined     No - patient declined information    Current Medications (verified) Outpatient Encounter Medications as of 05/16/2022  Medication Sig   Alirocumab (PRALUENT) 150 MG/ML SOAJ Inject 150 mg into the skin every 14 (fourteen) days. (Patient not taking: Reported on 12/30/2021)   allopurinol (ZYLOPRIM) 100 MG tablet TAKE 1 TABLET BY MOUTH EVERY DAY   apixaban (ELIQUIS) 5 MG TABS tablet TAKE 1 TABLET BY MOUTH TWICE A DAY    CALCIUM-MAGNESIUM-ZINC PO Take by mouth daily.   colchicine 0.6 MG tablet TAKE 1 TABLET BY MOUTH EVERY DAY   cyanocobalamin (VITAMIN B12) 1000 MCG tablet Take 1,000 mcg by mouth daily.   ezetimibe (ZETIA) 10 MG tablet Take 1 tablet (10 mg total) by mouth daily.   flecainide (TAMBOCOR) 50 MG tablet TAKE 1 TABLET BY MOUTH TWICE A DAY AS NEEDED   furosemide (LASIX) 20 MG tablet TAKE 1 TABLET BY MOUTH EVERY DAY   gabapentin (NEURONTIN) 100 MG capsule Take 3 capsules (300 mg total) by mouth at bedtime. (Patient not taking: Reported on 03/02/2022)   losartan (COZAAR) 100 MG tablet Take 1 tablet (100 mg total) by mouth daily.   metoprolol succinate (TOPROL-XL) 25 MG 24 hr tablet TAKE 1 TABLET BY MOUTH DAILY TAKE WITH OR IMMEDIATELY FOLLOWING A MEAL OK TO START WITH 1/2TAB DAILY   nitrofurantoin, macrocrystal-monohydrate, (MACROBID) 100 MG capsule Take 1 capsule (100 mg total) by mouth 2 (two) times daily. Take with food. (Patient not taking: Reported on 05/11/2022)   omeprazole (PRILOSEC) 20 MG capsule TAKE 1 CAPSULE (20 MG TOTAL) BY MOUTH 2 (TWO) TIMES DAILY BEFORE A MEAL.   POTASSIUM CHLORIDE PO Take by mouth daily.   No facility-administered encounter medications on file as of 05/16/2022.    Allergies (verified) Statins   History: Past Medical History:  Diagnosis Date   Chest pain  a. Neg stress tests - 2005, 2008, 2009;  b. 08/2014 Cardiac CT: Ca score 193 (80th%'ile).   Essential hypertension    Hiatal hernia    a. 08/2014 CT chest: large hiatal hernia.   History of DVT (deep vein thrombosis)    Hyperlipidemia    a. statin intolerant;  b. on zetia and repatha.   Palpitations    a. 02/2015 Event Monitor: NSR w/ rare, short episodes of A Tach, rare short runs of SVT < 15 beats.   Past Surgical History:  Procedure Laterality Date   CATARACT EXTRACTION, BILATERAL     LAPAROSCOPIC APPENDECTOMY N/A 12/02/2015   Procedure: APPENDECTOMY LAPAROSCOPIC;  Surgeon: Clovis Riley, MD;  Location:  [facility];  Service: General;  Laterality: N/A;   ROOT CANAL  2013   ROOT CANAL     VAGINAL HYSTERECTOMY     abdominal hysterectomy   Family History  Problem Relation Age of Onset   Heart disease Mother    Heart disease Father    Hyperlipidemia Sister    Hypothyroidism Sister    Thyroid cancer Brother    Heart disease Brother    Stroke Brother    Colon cancer Other        Grandparent   Social History   Socioeconomic History   Marital status: Married    Spouse name: Not on file   Number of children: Not on file   Years of education: Not on file   Highest education level: Not on file  Occupational History   Not on file  Tobacco Use   Smoking status: Never    Passive exposure: Never   Smokeless tobacco: Never  Vaping Use   Vaping Use: Never used  Substance and Sexual Activity   Alcohol use: No   Drug use: No   Sexual activity: Yes  Other Topics Concern   Not on file  Social History Narrative   Not on file   Social Determinants of Health   Financial Resource Strain: Low Risk  (05/12/2022)   Overall Financial Resource Strain (CARDIA)    Difficulty of Paying Living Expenses: Not hard at all  Food Insecurity: No Food Insecurity (05/12/2022)   Hunger Vital Sign    Worried About Running Out of Food in the Last Year: Never true    [place] in the Last Year: Never true  Transportation Needs: No Transportation Needs (05/12/2022)   PRAPARE - Hydrologist (Medical): No    Lack of Transportation (Non-Medical): No  Physical Activity: Insufficiently Active (05/12/2022)   Exercise Vital Sign    Days of Exercise per Week: 1 day    Minutes of Exercise per Session: 10 min  Stress: No Stress Concern Present (05/12/2022)   [place]    Feeling of Stress : Not at all  Social Connections: Unknown (05/12/2022)   Social Connection and Isolation Panel [NHANES]    Frequency of Communication  with Friends and Family: More than three times a week    Frequency of Social Gatherings with Friends and Family: Twice a week    Attends Religious Services: Not on Advertising copywriter or Organizations: Yes    Attends Archivist Meetings: More than 4 times per year    Marital Status: Married    Tobacco Counseling Counseling given: Not Answered   Clinical Intake:  Pre-visit preparation completed: Yes  Diabetes: No  How often do you need to have someone help you when you read instructions, pamphlets, or other written materials from your doctor or pharmacy?: 1 - Never           Activities of Daily Living    05/12/2022   12:01 PM  In your present state of health, do you have any difficulty performing the following activities:  Hearing? 1  Comment Hearing aids worn from time to time  Vision? 0  Difficulty concentrating or making decisions? 0  Walking or climbing stairs? 0  Dressing or bathing? 0  Doing errands, shopping? 0  Preparing Food and eating ? N  Using the Toilet? N  In the past six months, have you accidently leaked urine? N  Do you have problems with loss of bowel control? N  Managing your Medications? N  Managing your Finances? N  Housekeeping or managing your Housekeeping? N    Patient Care Team: Leone Haven, MD as PCP - General (Family Medicine) Rockey Situ Kathlene November, MD as Consulting Physician (Cardiology)  Indicate any recent Medical Services you may have received from other than [facility] providers in the past year (date may be approximate).     Assessment:   This is a routine wellness examination for [facility].  I connected with  Stacy Gardner on 05/16/22 by a audio enabled telemedicine application and verified that I am speaking with the correct person using two identifiers.  Patient Location: Home  Provider Location: Office/Clinic  I discussed the limitations of evaluation and management by telemedicine. The patient  expressed understanding and agreed to proceed.   Patient Medicare AWV questionnaire was completed by the patient on 05/12/22 , I have confirmed that information answered by patient is correct and after further discussion there are changes made to hearing since this date.    Hearing/Vision screen Hearing Screening - Comments:: Hearing aids worn from time to time. Minimum of 20 minutes daily.  Vision Screening - Comments:: Followed by [facility]  Cataract extraction, bilateral  They have regular follow up with the ophthalmologist    Dietary issues and exercise activities discussed: Current Exercise Habits: Home exercise routine, Type of exercise: walking, Time (Minutes): 10, Frequency (Times/Week): 1, Weekly Exercise (Minutes/Week): 10, Intensity: Mild  Healthy diet   Goals Addressed             This Visit's Progress    I would like to lose weight       Stay active.         Depression Screen    05/16/2022   11:40 AM 03/11/2022    4:49 PM 12/10/2021   10:43 AM 09/15/2021    8:07 AM 05/14/2021   11:57 AM 02/17/2021    9:37 AM 10/27/2020   11:00 AM  PHQ 2/9 Scores  PHQ - 2 Score 0 0 0 0 0 0 0    Fall Risk    05/12/2022   12:01 PM 03/11/2022    4:49 PM 12/10/2021   10:43 AM 09/15/2021    8:07 AM 05/14/2021   11:57 AM  Fall Risk   Falls in the past year? 0 0 0 0 0  Number falls in past yr: 0 0 0 0 0  Injury with Fall? 0 0 0 0   Risk for fall due to :  No Fall Risks No Fall Risks No Fall Risks   Follow up Falls evaluation completed;Falls prevention discussed Falls evaluation completed Falls evaluation completed Falls evaluation completed  Falls evaluation completed    FALL RISK PREVENTION PERTAINING TO THE HOME: Home free of loose throw rugs in walkways, pet beds, electrical cords, etc? Yes  Adequate lighting in your home to reduce risk of falls? Yes   ASSISTIVE DEVICES UTILIZED TO PREVENT FALLS: Life alert? No  Use of a cane, walker or w/c? No  Grab bars in  the bathroom? Yes  Shower chair or bench in shower? Yes  Comfort chair height toilet? Yes   TIMED UP AND GO: Was the test performed? No .   Cognitive Function:        05/16/2022   11:40 AM 08/25/2017    4:47 PM 08/24/2016    4:27 PM  6CIT Screen  What Year? 0 points 0 points 0 points  What month? 0 points 0 points 0 points  What time? 0 points 0 points 0 points  Count back from 20 0 points 0 points 0 points  Months in reverse 0 points 0 points 0 points  Repeat phrase 0 points 0 points 0 points  Total Score 0 points 0 points 0 points    Immunizations Immunization History  Administered Date(s) Administered   Fluad Quad(high Dose 65+) 04/08/2019, 04/20/2020, 02/17/2021, 12/10/2021   Hepatitis B, ADULT 03/17/2015, 04/21/2015, 09/15/2015   Influenza, High Dose Seasonal PF 01/13/2017, 01/17/2018   Influenza,inj,Quad PF,6+ Mos 03/03/2015, 02/17/2016   Influenza-Unspecified 12/10/2021   Moderna Sars-Covid-2 Vaccination 05/09/2019, 06/03/2019, 01/24/2020, 07/13/2020   Pneumococcal Conjugate-13 07/14/2016   Pneumococcal Polysaccharide-23 04/29/2020   Zoster Recombinat (Shingrix) 06/03/2021, 11/08/2021   TDAP status: Due, Education has been provided regarding the importance of this vaccine. Advised may receive this vaccine at local pharmacy or Health Dept. Aware to provide a copy of the vaccination record if obtained from local pharmacy or Health Dept. Verbalized acceptance and understanding.  Screening Tests Health Maintenance  Topic Date Due   DTaP/Tdap/Td (1 - Tdap) Never done   COVID-19 Vaccine (5 - 2023-24 season) 06/01/2022 (Originally 11/26/2021)   Medicare Annual Wellness (AWV)  05/17/2023   Pneumonia Vaccine 38+ Years old  Completed   INFLUENZA VACCINE  Completed   DEXA SCAN  Completed   Hepatitis C Screening  Completed   Zoster Vaccines- Shingrix  Completed   HPV VACCINES  Aged Out   Fecal DNA (Cologuard)  Discontinued   Health Maintenance Health Maintenance Due   Topic Date Due   DTaP/Tdap/Td (1 - Tdap) Never done   Lung Cancer Screening: (Low Dose CT Chest recommended if Age 39-80 years, 30 pack-year currently smoking OR have quit w/in 15years.) does not qualify.   Hepatitis C Screening: Completed 2016.  Vision Screening: Recommended annual ophthalmology exams for early detection of glaucoma and other disorders of the eye.  Dental Screening: Recommended annual dental exams for proper oral hygiene.  Community Resource Referral / Chronic Care Management: CRR required this visit?  No   CCM required this visit?  No      Plan:     I have personally reviewed and noted the following in the patient's chart:   Medical and social history Use of alcohol, tobacco or illicit drugs  Current medications and supplements including opioid prescriptions. Patient is not currently taking opioid prescriptions. Functional ability and status Nutritional status Physical activity Advanced directives List of other physicians Hospitalizations, surgeries, and ER visits in previous 12 months Vitals Screenings to include cognitive, depression, and falls Referrals and appointments  In addition, I have reviewed and discussed with patient certain preventive protocols, quality metrics, and  best practice recommendations. A written personalized care plan for preventive services as well as general preventive health recommendations were provided to patient.     Leta Jungling, LPN   [id number]

## 2022-05-16 NOTE — Patient Instructions (Addendum)
Dawn Herring , Thank you for taking time to come for your Medicare Wellness Visit. I appreciate your ongoing commitment to your health goals. Please review the following plan we discussed and let me know if I can assist you in the future.   These are the goals we discussed:  Goals      I would like to lose weight     Stay active.       Low carb foods        This is a list of the screening recommended for you and due dates:  Health Maintenance  Topic Date Due   DTaP/Tdap/Td vaccine (1 - Tdap) Never done   COVID-19 Vaccine (5 - 2023-24 season) 06/01/2022*   Medicare Annual Wellness Visit  05/17/2023   Pneumonia Vaccine  Completed   Flu Shot  Completed   DEXA scan (bone density measurement)  Completed   Hepatitis C Screening: USPSTF Recommendation to screen - Ages 60-79 yo.  Completed   Zoster (Shingles) Vaccine  Completed   HPV Vaccine  Aged Out   Cologuard (Stool DNA test)  Discontinued  *Topic was postponed. The date shown is not the original due date.    Advanced directives: on file  Conditions/risks identified: none new  Next appointment: Follow up in one year for your annual wellness visit    Preventive Care 65 Years and Older, Female Preventive care refers to lifestyle choices and visits with your health care provider that can promote health and wellness. What does preventive care include? A yearly physical exam. This is also called an annual well check. Dental exams once or twice a year. Routine eye exams. Ask your health care provider how often you should have your eyes checked. Personal lifestyle choices, including: Daily care of your teeth and gums. Regular physical activity. Eating a healthy diet. Avoiding tobacco and drug use. Limiting alcohol use. Practicing safe sex. Taking low-dose aspirin every day. Taking vitamin and mineral supplements as recommended by your health care provider. What happens during an annual well check? The services and screenings  done by your health care provider during your annual well check will depend on your age, overall health, lifestyle risk factors, and family history of disease. Counseling  Your health care provider may ask you questions about your: Alcohol use. Tobacco use. Drug use. Emotional well-being. Home and relationship well-being. Sexual activity. Eating habits. History of falls. Memory and ability to understand (cognition). Work and work Statistician. Reproductive health. Screening  You may have the following tests or measurements: Height, weight, and BMI. Blood pressure. Lipid and cholesterol levels. These may be checked every 5 years, or more frequently if you are over 78 years old. Skin check. Lung cancer screening. You may have this screening every year starting at age 78 if you have a 30-pack-year history of smoking and currently smoke or have quit within the past 15 years. Fecal occult blood test (FOBT) of the stool. You may have this test every year starting at age 78. Flexible sigmoidoscopy or colonoscopy. You may have a sigmoidoscopy every 5 years or a colonoscopy every 10 years starting at age 78. Hepatitis C blood test. Hepatitis B blood test. Sexually transmitted disease (STD) testing. Diabetes screening. This is done by checking your blood sugar (glucose) after you have not eaten for a while (fasting). You may have this done every 1-3 years. Bone density scan. This is done to screen for osteoporosis. You may have this done starting at age 78. Mammogram. This may  be done every 1-2 years. Talk to your health care provider about how often you should have regular mammograms. Talk with your health care provider about your test results, treatment options, and if necessary, the need for more tests. Vaccines  Your health care provider may recommend certain vaccines, such as: Influenza vaccine. This is recommended every year. Tetanus, diphtheria, and acellular pertussis (Tdap, Td)  vaccine. You may need a Td booster every 10 years. Zoster vaccine. You may need this after age 78. Pneumococcal 13-valent conjugate (PCV13) vaccine. One dose is recommended after age 78. Pneumococcal polysaccharide (PPSV23) vaccine. One dose is recommended after age 78. Talk to your health care provider about which screenings and vaccines you need and how often you need them. This information is not intended to replace advice given to you by your health care provider. Make sure you discuss any questions you have with your health care provider. Document Released: 04/10/2015 Document Revised: 12/02/2015 Document Reviewed: 01/13/2015 Elsevier Interactive Patient Education  2017 [place] Prevention in the Home Falls can cause injuries. They can happen to people of all ages. There are many things you can do to make your home safe and to help prevent falls. What can I do on the outside of my home? Regularly fix the edges of walkways and driveways and fix any cracks. Remove anything that might make you trip as you walk through a door, such as a raised step or threshold. Trim any bushes or trees on the path to your home. Use bright outdoor lighting. Clear any walking paths of anything that might make someone trip, such as rocks or tools. Regularly check to see if handrails are loose or broken. Make sure that both sides of any steps have handrails. Any raised decks and porches should have guardrails on the edges. Have any leaves, snow, or ice cleared regularly. Use sand or salt on walking paths during winter. Clean up any spills in your garage right away. This includes oil or grease spills. What can I do in the bathroom? Use night lights. Install grab bars by the toilet and in the tub and shower. Do not use towel bars as grab bars. Use non-skid mats or decals in the tub or shower. If you need to sit down in the shower, use a plastic, non-slip stool. Keep the floor dry. Clean up any  water that spills on the floor as soon as it happens. Remove soap buildup in the tub or shower regularly. Attach bath mats securely with double-sided non-slip rug tape. Do not have throw rugs and other things on the floor that can make you trip. What can I do in the bedroom? Use night lights. Make sure that you have a light by your bed that is easy to reach. Do not use any sheets or blankets that are too big for your bed. They should not hang down onto the floor. Have a firm chair that has side arms. You can use this for support while you get dressed. Do not have throw rugs and other things on the floor that can make you trip. What can I do in the kitchen? Clean up any spills right away. Avoid walking on wet floors. Keep items that you use a lot in easy-to-reach places. If you need to reach something above you, use a strong step stool that has a grab bar. Keep electrical cords out of the way. Do not use floor polish or wax that makes floors slippery. If you must use  wax, use non-skid floor wax. Do not have throw rugs and other things on the floor that can make you trip. What can I do with my stairs? Do not leave any items on the stairs. Make sure that there are handrails on both sides of the stairs and use them. Fix handrails that are broken or loose. Make sure that handrails are as long as the stairways. Check any carpeting to make sure that it is firmly attached to the stairs. Fix any carpet that is loose or worn. Avoid having throw rugs at the top or bottom of the stairs. If you do have throw rugs, attach them to the floor with carpet tape. Make sure that you have a light switch at the top of the stairs and the bottom of the stairs. If you do not have them, ask someone to add them for you. What else can I do to help prevent falls? Wear shoes that: Do not have high heels. Have rubber bottoms. Are comfortable and fit you well. Are closed at the toe. Do not wear sandals. If you use a  stepladder: Make sure that it is fully opened. Do not climb a closed stepladder. Make sure that both sides of the stepladder are locked into place. Ask someone to hold it for you, if possible. Clearly mark and make sure that you can see: Any grab bars or handrails. First and last steps. Where the edge of each step is. Use tools that help you move around (mobility aids) if they are needed. These include: Canes. Walkers. Scooters. Crutches. Turn on the lights when you go into a dark area. Replace any light bulbs as soon as they burn out. Set up your furniture so you have a clear path. Avoid moving your furniture around. If any of your floors are uneven, fix them. If there are any pets around you, be aware of where they are. Review your medicines with your doctor. Some medicines can make you feel dizzy. This can increase your chance of falling. Ask your doctor what other things that you can do to help prevent falls. This information is not intended to replace advice given to you by your health care provider. Make sure you discuss any questions you have with your health care provider. Document Released: 01/08/2009 Document Revised: 08/20/2015 Document Reviewed: 04/18/2014 Elsevier Interactive Patient Education  2017 [employer].

## 2022-05-31 NOTE — Progress Notes (Signed)
Office Visit Note  Patient: Dawn Herring             Date of Birth: [birth year]           MRN: [id number]             PCP: Glori Luis, MD Referring: Glori Luis, MD Visit Date: 06/01/2022   Subjective:  Follow-up   History of Present Illness: Dawn Herring is a 77 y.o. female here for follow up for gout on allopurinol 150 mg daily and colchicine 0.6 mg daily prophylaxis.  Since her last visit she is doing better with less foot pain and swelling.  She has not had any major gout flareup.  Occasionally has some pain between the first and second toe radiating into the foot.  There is a bit more difficulty with range of motion in the third finger on both hands with a lot of left-sided triggering.  She had a few episodes with A-fib back under control quickly with taking the flecainide.  Previous HPI 03/02/22 Dawn Herring is a 77 y.o. female here for follow up for gout on allopurinol 150 mg daily and colchicine prophylaxis after uric acid at goal in September. She has been generally feeling well since our last visit but has experienced foot pain and swelling starting Monday when she first stepped out of bed. This is worst at the outside edge of the right foot. Pain is worse with walking on her foot and she started wearing her compression socks and ankle sleeve for this with some relief. Feels like early symptoms of previous gout flares. She has not experienced any repeat problems with elbow inflammation.    Previous HPI 12/30/21 Dawn Herring is a 77 y.o. female here for gout now on allopurinol 150 mg daily.  Gout episode started with pain and inflammation in the feet and ankles around June this year.  Subsequently developed increased joint pain and some swelling affecting her thumb and that in the right elbow.  She saw her PCP office and started treatment with prednisone with initial improvement of symptoms.  In follow-up uric acid was found to remain elevated so started allopurinol  treatment initially at 100 mg daily.  She was also started on colchicine 0.6 mg daily for flare prophylaxis.  Initial follow-up with repeat uric acid still above 6 so allopurinol titrated to 150 mg and has been taking this dose for less than 1 month but uric acid did improve to 5.2.  So far she has not suffered any repeat episode after the right elbow inflammation.  Prior to onset of the initial flares with the lower extremities she was started on new medication by her cardiologist no other preceding illness or medical events.  She was seen in GI clinic for melena with upper GI ulcers identified and on long term anticoagulation for Afib. She was previously treated with indomethacin for inflammatory arthritis but not taking any NSAIDs at this time. Also with some peripheral neuropathic pain partially improved with gabapentin.  She reports a history of gout with 1 episode of podagra that occurred in her 31s.  She had a hysterectomy at age 70.  No history of kidney stones.  Her metabolic panel have shown mild renal impairment with estimated GFR in high 50s and stable.     Labs reviewed 11/2021 Uric acid 5.2 eGFR 53.98   10/2021 Uric acid 6.2 eGFR 53.39   09/2021 Uric acid 7.4 eGFR 55.36   Review of  Systems  Constitutional:  Positive for fatigue.  HENT: Negative.  Negative for mouth sores and mouth dryness.   Eyes: Negative.  Negative for dryness.  Respiratory:  Positive for shortness of breath.   Cardiovascular:  Positive for palpitations. Negative for chest pain.  Gastrointestinal: Negative.  Negative for blood in stool, constipation and diarrhea.  Endocrine: Negative.  Negative for increased urination.  Genitourinary: Negative.  Negative for involuntary urination.  Musculoskeletal:  Positive for joint pain, joint pain, joint swelling and morning stiffness. Negative for gait problem, myalgias, muscle weakness, muscle tenderness and myalgias.  Skin: Negative.  Negative for color change, rash,  hair loss and sensitivity to sunlight.  Allergic/Immunologic: Negative.  Negative for susceptible to infections.  Neurological:  Negative for dizziness and headaches.  Hematological: Negative.  Negative for swollen glands.  Psychiatric/Behavioral:  Positive for sleep disturbance. Negative for depressed mood. The patient is not nervous/anxious.     PMFS History:  Patient Active Problem List   Diagnosis Date Noted   Trigger finger, left middle finger 06/01/2022   Suprapubic pressure 03/11/2022   Diverticular disease of colon 10/13/2021   Epigastric pain 10/13/2021   Gastroesophageal reflux disease 10/13/2021   New onset of headaches after age 61 10/13/2021   Melena 09/15/2021   Gout 06/08/2021   Itching 06/08/2021   Allergic rhinitis 06/08/2021   Right elbow pain 02/17/2021   Cutaneous skin tags 10/27/2020   Prediabetes 04/29/2020   Left hip pain 01/17/2018   Rotator cuff impingement syndrome of left shoulder 01/17/2018   Neuropathy 01/13/2017   Arrhythmia    Essential hypertension    Pain in right foot 03/17/2015   Non-alcoholic fatty liver disease 03/03/2015   Thyroid nodule 01/26/2015   CAD (coronary artery disease) 01/26/2015   Tachycardia 07/30/2014   Obesity 09/05/2010   Hyperlipidemia 04/14/2010    Past Medical History:  Diagnosis Date   Chest pain    a. Neg stress tests - 2005, 2008, 2009;  b. 08/2014 Cardiac CT: Ca score 193 (80th%'ile).   Essential hypertension    Hiatal hernia    a. 08/2014 CT chest: large hiatal hernia.   History of DVT (deep vein thrombosis)    Hyperlipidemia    a. statin intolerant;  b. on zetia and repatha.   Palpitations    a. 02/2015 Event Monitor: NSR w/ rare, short episodes of A Tach, rare short runs of SVT < 15 beats.    Family History  Problem Relation Age of Onset   Heart disease Mother    Heart disease Father    Hyperlipidemia Sister    Hypothyroidism Sister    Thyroid cancer Brother    Heart disease Brother    Stroke  Brother    Colon cancer Other        Grandparent   Past Surgical History:  Procedure Laterality Date   CATARACT EXTRACTION, BILATERAL     LAPAROSCOPIC APPENDECTOMY N/A 12/02/2015   Procedure: APPENDECTOMY LAPAROSCOPIC;  Surgeon: Berna Bue, MD;  Location: [facility] OR;  Service: General;  Laterality: N/A;   ROOT CANAL  2013   ROOT CANAL     VAGINAL HYSTERECTOMY     abdominal hysterectomy   Social History   Social History Narrative   Not on file   Immunization History  Administered Date(s) Administered   Fluad Quad(high Dose 65+) 04/08/2019, 04/20/2020, 02/17/2021, 12/10/2021   Hepatitis B, ADULT 03/17/2015, 04/21/2015, 09/15/2015   Influenza, High Dose Seasonal PF 01/13/2017, 01/17/2018   Influenza,inj,Quad PF,6+ Mos 03/03/2015, 02/17/2016  Influenza-Unspecified 12/10/2021   Moderna Sars-Covid-2 Vaccination 05/09/2019, 06/03/2019, 01/24/2020, 07/13/2020   Pneumococcal Conjugate-13 07/14/2016   Pneumococcal Polysaccharide-23 04/29/2020   Zoster Recombinat (Shingrix) 06/03/2021, 11/08/2021     Objective: Vital Signs: BP (!) 144/80 (BP Location: Left Arm, Patient Position: Sitting, Cuff Size: Normal)   Pulse (!) 57   Resp 16   Ht 5\' 5"  (1.651 m)   Wt 194 lb (88 kg)   BMI 32.28 kg/m    Physical Exam Constitutional:      Appearance: She is obese.  Cardiovascular:     Rate and Rhythm: Normal rate and regular rhythm.  Pulmonary:     Effort: Pulmonary effort is normal.     Breath sounds: Normal breath sounds.  Skin:    General: Skin is warm and dry.     Findings: No rash.  Neurological:     Mental Status: She is alert.  Psychiatric:        Mood and Affect: Mood normal.      Musculoskeletal Exam:  Shoulders full ROM no tenderness or swelling Elbows full ROM no tenderness or swelling Wrists full ROM no tenderness or swelling Fingers full ROM no tenderness or swelling right third PIP slightly limited flexion and extension range of motion without palpable synovitis,  left third finger triggering otherwise full range of motion and no palpable tenderness or swelling Knees full ROM no tenderness or swelling Ankles full ROM mild overlying swelling with no erythema or warmth MTPs full ROM no tenderness or swelling   Investigation: No additional findings.  Imaging: No results found.  Recent Labs: Lab Results  Component Value Date   WBC 5.1 12/10/2021   HGB 13.0 12/10/2021   PLT 212.0 12/10/2021   NA 140 03/02/2022   K 4.3 03/02/2022   CL 103 03/02/2022   CO2 27 03/02/2022   GLUCOSE 67 03/02/2022   BUN 20 03/02/2022   CREATININE 0.98 03/02/2022   BILITOT 0.4 03/02/2022   ALKPHOS 56 10/13/2021   AST 23 03/02/2022   ALT 17 03/02/2022   PROT 7.4 03/02/2022   ALBUMIN 3.9 10/13/2021   CALCIUM 9.9 03/02/2022   GFRAA 67 04/20/2020    Speciality Comments: No specialty comments available.  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  Uric acid has been well-controlled for almost 6 months recommend she can discontinue prophylactic colchicine but keep the medicine on hand as needed if a flare develops.  Continue allopurinol 150 mg daily. No significant change in diuretics plans to have updated labs checked with PCP office end of next week will check to make sure in about 2 weeks.  Pain in right foot  Currently doing well not having the degree of lateral foot pain as at last visit and no active synovitis appreciated.  Trigger finger, left middle finger  Ongoing triggering and not causing much pain or functional limitation discussed option  for trigger finger injection to alleviate but not causing enough problems to want this currently.  Orders: No orders of the defined types were placed in this encounter.  Meds ordered this encounter  Medications   colchicine 0.6 MG tablet  Sig: Take 1 tablet (0.6 mg total) by mouth as needed.    Dispense:  90 tablet    Refill:  0   allopurinol (ZYLOPRIM) 100 MG tablet    Sig: Take 1.5 tablets (150 mg total) by mouth daily.    Dispense:  90 tablet    Refill:  2     Follow-Up Instructions: Return in about 6 months (around 12/02/2022) for Gout on allopurinol f/u 6mos.   Fuller Plan, MD  Note - This record has been created using [employer].  Chart creation errors have been sought, but may not always  have been located. Such creation errors do not reflect on  the standard of medical care.

## 2022-06-01 ENCOUNTER — Ambulatory Visit: Payer: Medicare Other | Attending: Internal Medicine | Admitting: Internal Medicine

## 2022-06-01 ENCOUNTER — Encounter: Payer: Self-pay | Admitting: Internal Medicine

## 2022-06-01 VITALS — BP 144/80 | HR 57 | Resp 16 | Ht 65.0 in | Wt 194.0 lb

## 2022-06-01 DIAGNOSIS — M1A2790 Drug-induced chronic gout, unspecified ankle and foot, without tophus (tophi): Secondary | ICD-10-CM | POA: Insufficient documentation

## 2022-06-01 DIAGNOSIS — M65332 Trigger finger, left middle finger: Secondary | ICD-10-CM | POA: Diagnosis not present

## 2022-06-01 DIAGNOSIS — M10079 Idiopathic gout, unspecified ankle and foot: Secondary | ICD-10-CM | POA: Insufficient documentation

## 2022-06-01 DIAGNOSIS — M79671 Pain in right foot: Secondary | ICD-10-CM | POA: Diagnosis not present

## 2022-06-01 MED ORDER — ALLOPURINOL 100 MG PO TABS: 150.0000 mg | ORAL_TABLET | Freq: Every day | ORAL | 2 refills | Status: DC

## 2022-06-01 MED ORDER — COLCHICINE 0.6 MG PO TABS: 0.6000 mg | ORAL_TABLET | ORAL | 0 refills | Status: DC | PRN

## 2022-06-10 ENCOUNTER — Encounter: Payer: Self-pay | Admitting: Family Medicine

## 2022-06-10 ENCOUNTER — Ambulatory Visit (INDEPENDENT_AMBULATORY_CARE_PROVIDER_SITE_OTHER): Payer: Medicare Other | Admitting: Family Medicine

## 2022-06-10 VITALS — BP 122/74 | HR 58 | Temp 98.4°F | Ht 65.0 in | Wt 194.2 lb

## 2022-06-10 DIAGNOSIS — G479 Sleep disorder, unspecified: Secondary | ICD-10-CM

## 2022-06-10 DIAGNOSIS — R7303 Prediabetes: Secondary | ICD-10-CM

## 2022-06-10 DIAGNOSIS — I499 Cardiac arrhythmia, unspecified: Secondary | ICD-10-CM | POA: Diagnosis not present

## 2022-06-10 DIAGNOSIS — M65332 Trigger finger, left middle finger: Secondary | ICD-10-CM

## 2022-06-10 DIAGNOSIS — I10 Essential (primary) hypertension: Secondary | ICD-10-CM

## 2022-06-10 DIAGNOSIS — M1A2790 Drug-induced chronic gout, unspecified ankle and foot, without tophus (tophi): Secondary | ICD-10-CM

## 2022-06-10 DIAGNOSIS — E782 Mixed hyperlipidemia: Secondary | ICD-10-CM | POA: Diagnosis not present

## 2022-06-10 DIAGNOSIS — K921 Melena: Secondary | ICD-10-CM

## 2022-06-10 DIAGNOSIS — L299 Pruritus, unspecified: Secondary | ICD-10-CM

## 2022-06-10 LAB — LIPID PANEL
Cholesterol: 258 mg/dL — ABNORMAL HIGH (ref 0–200)
HDL: 45.3 mg/dL (ref >39.00)
NonHDL: 213.18
Total CHOL/HDL Ratio: 6
Triglycerides: 371 mg/dL — ABNORMAL HIGH (ref 0.0–149.0)
VLDL: 74.2 mg/dL — ABNORMAL HIGH (ref 0.0–40.0)

## 2022-06-10 LAB — CBC WITH DIFFERENTIAL/PLATELET
Basophils Absolute: 0 10*3/uL (ref 0.0–0.1)
Basophils Relative: 0.5 % (ref 0.0–3.0)
Eosinophils Absolute: 0.1 10*3/uL (ref 0.0–0.7)
Eosinophils Relative: 1.3 % (ref 0.0–5.0)
HCT: 35.7 % — ABNORMAL LOW (ref 36.0–46.0)
Hemoglobin: 11.9 g/dL — ABNORMAL LOW (ref 12.0–15.0)
Lymphocytes Relative: 29.7 % (ref 12.0–46.0)
Lymphs Abs: 1.5 10*3/uL (ref 0.7–4.0)
MCHC: 33.2 g/dL (ref 30.0–36.0)
MCV: 92.5 fl (ref 78.0–100.0)
Monocytes Absolute: 0.4 10*3/uL (ref 0.1–1.0)
Monocytes Relative: 8 % (ref 3.0–12.0)
Neutro Abs: 3.1 10*3/uL (ref 1.4–7.7)
Neutrophils Relative %: 60.5 % (ref 43.0–77.0)
Platelets: 223 10*3/uL (ref 150.0–400.0)
RBC: 3.86 Mil/uL — ABNORMAL LOW (ref 3.87–5.11)
RDW: 15.5 % (ref 11.5–15.5)
WBC: 5.1 10*3/uL (ref 4.0–10.5)

## 2022-06-10 LAB — HEMOGLOBIN A1C: Hgb A1c MFr Bld: 6.5 % (ref 4.6–6.5)

## 2022-06-10 LAB — COMPREHENSIVE METABOLIC PANEL
ALT: 14 U/L (ref 0–35)
AST: 18 U/L (ref 0–37)
Albumin: 3.7 g/dL (ref 3.5–5.2)
Alkaline Phosphatase: 67 U/L (ref 39–117)
BUN: 19 mg/dL (ref 6–23)
CO2: 28 mEq/L (ref 19–32)
Calcium: 9.1 mg/dL (ref 8.4–10.5)
Chloride: 103 mEq/L (ref 96–112)
Creatinine, Ser: 0.97 mg/dL (ref 0.40–1.20)
GFR: 56.47 mL/min — ABNORMAL LOW (ref >60.00)
Glucose, Bld: 98 mg/dL (ref 70–99)
Potassium: 4 mEq/L (ref 3.5–5.1)
Sodium: 140 mEq/L (ref 135–145)
Total Bilirubin: 0.6 mg/dL (ref 0.2–1.2)
Total Protein: 6.4 g/dL (ref 6.0–8.3)

## 2022-06-10 LAB — LDL CHOLESTEROL, DIRECT: Direct LDL: 141 mg/dL

## 2022-06-10 LAB — URIC ACID: Uric Acid, Serum: 5.3 mg/dL (ref 2.4–7.0)

## 2022-06-10 NOTE — Assessment & Plan Note (Signed)
Chronic issue.  Well-controlled.  Patient will continue metoprolol 25 mg daily and losartan 100 mg daily.

## 2022-06-10 NOTE — Assessment & Plan Note (Signed)
Chronic issue.  Has intermittent issues with A-fib.  She will continue Eliquis 5 mg twice daily and metoprolol 25 mg daily.  Flecainide is managed by cardiology.  She will continue to follow with her cardiologist.

## 2022-06-10 NOTE — Assessment & Plan Note (Signed)
Chronic issue.  Continue allopurinol 150 mg daily.  Check uric acid and CBC today.

## 2022-06-10 NOTE — Progress Notes (Signed)
Tommi Rumps, MD Phone: [phone]  Dawn Herring is a 77 y.o. female who presents today for f/u.  HYPERTENSION/afib Disease Monitoring Chest pain- no    Dyspnea- no    Palpitations- occasionally, resolve with flecainide Medications Compliance-  taking eliquis, metoprolol, losartan, flecainide prn.  Edema- no    Bleeding- no BMET    Component Value Date/Time   NA 140 03/02/2022 1359   NA 142 04/20/2020 1146   K 4.3 03/02/2022 1359   CL 103 03/02/2022 1359   CO2 27 03/02/2022 1359   GLUCOSE 67 03/02/2022 1359   BUN 20 03/02/2022 1359   BUN 15 04/20/2020 1146   CREATININE 0.98 03/02/2022 1359   CALCIUM 9.9 03/02/2022 1359   GFRNONAA 58 (L) 04/20/2020 1146   GFRAA 67 04/20/2020 1146   HYPERLIPIDEMIA Symptoms Chest pain on exertion:  no   Medications: Compliance- taking zetia Right upper quadrant pain- no  Muscle aches- no Lipid Panel     Component Value Date/Time   CHOL 194 10/27/2020 1125   CHOL 204 (H) 04/20/2020 1146   TRIG 330.0 (H) 10/27/2020 1125   HDL 47.50 10/27/2020 1125   HDL 49 04/20/2020 1146   CHOLHDL 4 10/27/2020 1125   VLDL 66.0 (H) 10/27/2020 1125   LDLCALC 83 04/20/2020 1146   LDLDIRECT 86.0 10/27/2020 1125   LABVLDL 72 (H) 04/20/2020 1146   Right thumb pain: Patient notes this has been bothering her today at her interphalangeal joint.  The joint is a little swollen.  Does not hurt to light touch.  Trigger finger: Patient reports left middle trigger finger.  She notes it locks at times.  She discussed this with her rheumatologist and they offered a referral to have something done about this though she declined at that time.  Gout: She continues on allopurinol.  Her rheumatologist stopped her colchicine recently.  Itching: This occurs occasionally in certain spots throughout her body.  She saw her dermatologist for this and they noted she should try Dove soap to help.  She has been using an oatmeal treatment which has been the most helpful  thing.  Difficulty sleeping: Patient notes this is a chronic issue.  She gets into bed around 10 PM and occasionally it is 4 AM before she falls asleep.  She gets up around 8 AM.  She reads before bed though it is on her [place].  She has very little caffeine if any.  No alcohol intake.  No depression or anxiety.  Social History   Tobacco Use  Smoking Status Never   Passive exposure: Never  Smokeless Tobacco Never    Current Outpatient Medications on File Prior to Visit  Medication Sig Dispense Refill   Alirocumab (PRALUENT) 150 MG/ML SOAJ Inject 150 mg into the skin every 14 (fourteen) days. 2 mL 11   allopurinol (ZYLOPRIM) 100 MG tablet Take 1.5 tablets (150 mg total) by mouth daily. 90 tablet 2   apixaban (ELIQUIS) 5 MG TABS tablet TAKE 1 TABLET BY MOUTH TWICE A DAY 180 tablet 1   CALCIUM-MAGNESIUM-ZINC PO Take by mouth daily.     cyanocobalamin (VITAMIN B12) 1000 MCG tablet Take 1,000 mcg by mouth daily.     ezetimibe (ZETIA) 10 MG tablet Take 1 tablet (10 mg total) by mouth daily. 90 tablet 3   flecainide (TAMBOCOR) 50 MG tablet TAKE 1 TABLET BY MOUTH TWICE A DAY AS NEEDED 180 tablet 3   furosemide (LASIX) 20 MG tablet TAKE 1 TABLET BY MOUTH EVERY DAY 90 tablet  1   losartan (COZAAR) 100 MG tablet Take 1 tablet (100 mg total) by mouth daily. 90 tablet 3   metoprolol succinate (TOPROL-XL) 25 MG 24 hr tablet TAKE 1 TABLET BY MOUTH DAILY TAKE WITH OR IMMEDIATELY FOLLOWING A MEAL OK TO START WITH 1/2TAB DAILY 90 tablet 3   omeprazole (PRILOSEC) 20 MG capsule TAKE 1 CAPSULE (20 MG TOTAL) BY MOUTH 2 (TWO) TIMES DAILY BEFORE A MEAL. 180 capsule 1   POTASSIUM CHLORIDE PO Take by mouth daily.     No current facility-administered medications on file prior to visit.     ROS see history of present illness  Objective  Physical Exam Vitals:   06/10/22 1023  BP: 122/74  Pulse: (!) 58  Temp: 98.4 F (36.9 C)  SpO2: 96%    BP Readings from Last 3 Encounters:  06/10/22 122/74   06/01/22 (!) 144/80  05/11/22 (!) 145/70   Wt Readings from Last 3 Encounters:  06/10/22 194 lb 3.2 oz (88.1 kg)  06/01/22 194 lb (88 kg)  05/16/22 194 lb (88 kg)    Physical Exam Constitutional:      General: She is not in acute distress.    Appearance: She is not diaphoretic.  Cardiovascular:     Rate and Rhythm: Normal rate and regular rhythm.     Heart sounds: Normal heart sounds.  Pulmonary:     Effort: Pulmonary effort is normal.     Breath sounds: Normal breath sounds.  Musculoskeletal:     Comments: Right thumb with some tenderness over the ulnar aspect of the interphalangeal joint, no overlying skin changes, there is some joint enlargement in this area  Skin:    General: Skin is warm and dry.  Neurological:     Mental Status: She is alert.      Assessment/Plan: Please see individual problem list.  Cardiac arrhythmia, unspecified cardiac arrhythmia type Assessment & Plan: Chronic issue.  Has intermittent issues with A-fib.  She will continue Eliquis 5 mg twice daily and metoprolol 25 mg daily.  Flecainide is managed by cardiology.  She will continue to follow with her cardiologist.   Mixed hyperlipidemia Assessment & Plan: Chronic issue.  She will continue Zetia 10 mg daily.  We will check labs today.  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  Continue allopurinol 150 mg daily.  Check uric acid and CBC today.  Orders: -     CBC with Differential/Platelet -     Comprehensive metabolic panel -     Uric acid  Essential hypertension Assessment & Plan: Chronic issue.  Well-controlled.  Patient will continue metoprolol 25 mg daily and losartan 100 mg daily.   Trigger finger, left middle finger Assessment & Plan: Chronic issue.  She will let us know when she would like to see orthopedics for this.   Prediabetes Assessment &  Plan: Chronic issue.  Check A1c.  Orders: -     Hemoglobin A1c  Itching Assessment & Plan: Undetermined cause.  She will follow the advice of her dermatologist.   Difficulty sleeping Assessment & Plan: Encouraged good sleep hygiene.  Discussed potential for starting medication though she declines this.  She will let us know if she would like medication for sleep in the future.      Return in about 6 months (around 12/11/2022).   Tommi Rumps, MD [place]  [place]

## 2022-06-10 NOTE — Assessment & Plan Note (Signed)
Chronic issue.  She will let us know when she would like to see orthopedics for this.

## 2022-06-10 NOTE — Assessment & Plan Note (Signed)
Undetermined cause.  She will follow the advice of her dermatologist.

## 2022-06-10 NOTE — Patient Instructions (Signed)
Nice to see you. Will get lab work today and contact you with the results. 

## 2022-06-10 NOTE — Assessment & Plan Note (Signed)
Encouraged good sleep hygiene.  Discussed potential for starting medication though she declines this.  She will let us know if she would like medication for sleep in the future.

## 2022-06-10 NOTE — Assessment & Plan Note (Signed)
Chronic issue.  Check A1c. 

## 2022-06-10 NOTE — Assessment & Plan Note (Signed)
Chronic issue.  She will continue Zetia 10 mg daily.  We will check labs today.

## 2022-06-13 ENCOUNTER — Telehealth: Payer: Self-pay

## 2022-06-13 NOTE — Telephone Encounter (Signed)
Left message to call back regarding her lab results

## 2022-06-13 NOTE — Telephone Encounter (Signed)
-----   Message from Eric G Sonnenberg, MD sent at 06/13/2022 10:53 AM EDT ----- Please let the patient know that she is very minimally anemic.  We can recheck this at her next visit.  Her kidney function is generally stable.  Her triglycerides were generally stable.  Her uric acid is well-controlled.  Her A1c is now into the diabetic range.  She needs to actively work on dietary changes and exercise to help with this.  I can send her dietary guidelines if she would like.  Her LDL is above goal.  Would she like to see if we can get her back on Praluent? 

## 2022-06-16 ENCOUNTER — Telehealth: Payer: Self-pay

## 2022-06-16 NOTE — Telephone Encounter (Signed)
Left message to call the office back.

## 2022-06-16 NOTE — Telephone Encounter (Signed)
-----   Message from Leone Haven, MD sent at 06/13/2022 10:53 AM EDT ----- Please let the patient know that she is very minimally anemic.  We can recheck this at her next visit.  Her kidney function is generally stable.  Her triglycerides were generally stable.  Her uric acid is well-controlled.  Her A1c is now into the diabetic range.  She needs to actively work on dietary changes and exercise to help with this.  I can send her dietary guidelines if she would like.  Her LDL is above goal.  Would she like to see if we can get her back on Praluent?

## 2022-06-17 MED ORDER — PRALUENT 150 MG/ML ~~LOC~~ SOAJ: 150.0000 mg | SUBCUTANEOUS | 11 refills | Status: DC

## 2022-06-17 NOTE — Telephone Encounter (Signed)
Left Patient message that I will call back on Monday

## 2022-06-17 NOTE — Telephone Encounter (Signed)
Praluent sent to pharmacy. Patient will need follow-up labs 6 weeks after starting this. Please get her scheduled once she gets on the praluent.

## 2022-06-21 NOTE — Telephone Encounter (Signed)
Called Patient to let her know the Praluent was called in and that she will need lab work 6 weeks after starting the lab work to please call and schedule the lab work

## 2022-06-28 DIAGNOSIS — Z961 Presence of intraocular lens: Secondary | ICD-10-CM | POA: Diagnosis not present

## 2022-06-28 DIAGNOSIS — H5213 Myopia, bilateral: Secondary | ICD-10-CM | POA: Diagnosis not present

## 2022-06-28 LAB — HM DIABETES EYE EXAM

## 2022-08-19 ENCOUNTER — Other Ambulatory Visit: Payer: Self-pay | Admitting: Internal Medicine

## 2022-08-19 DIAGNOSIS — M10079 Idiopathic gout, unspecified ankle and foot: Secondary | ICD-10-CM

## 2022-08-19 NOTE — Telephone Encounter (Signed)
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

## 2022-08-22 ENCOUNTER — Other Ambulatory Visit: Payer: Self-pay | Admitting: Family Medicine

## 2022-08-22 DIAGNOSIS — K921 Melena: Secondary | ICD-10-CM

## 2022-10-20 ENCOUNTER — Other Ambulatory Visit: Payer: Self-pay | Admitting: Cardiovascular Disease

## 2022-10-20 DIAGNOSIS — I480 Paroxysmal atrial fibrillation: Secondary | ICD-10-CM

## 2022-10-20 NOTE — Telephone Encounter (Signed)
Prescription refill request for Eliquis received. Indication: Afib  Last office visit: 05/11/22 Mariah Milling)  Scr: 0.97 (06/10/22)  Age: 77 Weight: 88.1kg  Appropriate dose. Refill sent.

## 2022-11-10 ENCOUNTER — Encounter (INDEPENDENT_AMBULATORY_CARE_PROVIDER_SITE_OTHER): Payer: Self-pay

## 2022-11-19 ENCOUNTER — Other Ambulatory Visit: Payer: Self-pay | Admitting: Family Medicine

## 2022-11-19 DIAGNOSIS — K921 Melena: Secondary | ICD-10-CM

## 2022-11-21 NOTE — Progress Notes (Signed)
Office Visit Note  Patient: Dawn Herring             Date of Birth: March 31, 1945           MRN: [id number]             PCP: Glori Luis, MD Referring: Glori Luis, MD Visit Date: 12/05/2022   Subjective:  Follow-up (Patient states she was on Praluent, she had taken one injection around April or May and had symptoms and has not taken it since. Patient states she has pain and swelling in her right thumb. )   History of Present Illness: Dawn Herring is a 77 y.o. female here for follow up for gout on allopurinol 150 mg daily.   Since our last visit in March she had 1 episode of significantly increased joint pain and stiffness when she started on Praluent injection for her cholesterol so stopped taking this medication.  Otherwise did not experience any flareups of gouty arthritis pain in her feet and ankles.  She continues having symptoms daily with her hands worst affected area is the right thumb where she gets pain and frequently visible swelling at the distal joint.  Also has some restricted mobility in her third finger on both hands left hand consistently triggering on a daily basis.  Sometimes this will get stuck and cannot move it freely for hours without using her right hand for assistance.  She takes Tylenol as needed for joint pain cannot use other medicines because of anticoagulation for A-fib.  Previous HPI 06/01/2022 Dawn Herring is a 77 y.o. female here for follow up for gout on allopurinol 150 mg daily and colchicine 0.6 mg daily prophylaxis.  Since her last visit she is doing better with less foot pain and swelling.  She has not had any major gout flareup.  Occasionally has some pain between the first and second toe radiating into the foot.  There is a bit more difficulty with range of motion in the third finger on both hands with a lot of left-sided triggering.  She had a few episodes with A-fib back under control quickly with taking the flecainide.   Previous  HPI 03/02/22 Dawn Herring is a 77 y.o. female here for follow up for gout on allopurinol 150 mg daily and colchicine prophylaxis after uric acid at goal in September. She has been generally feeling well since our last visit but has experienced foot pain and swelling starting Monday when she first stepped out of bed. This is worst at the outside edge of the right foot. Pain is worse with walking on her foot and she started wearing her compression socks and ankle sleeve for this with some relief. Feels like early symptoms of previous gout flares. She has not experienced any repeat problems with elbow inflammation.    Previous HPI 12/30/21 Dawn Herring is a 77 y.o. female here for gout now on allopurinol 150 mg daily.  Gout episode started with pain and inflammation in the feet and ankles around June this year.  Subsequently developed increased joint pain and some swelling affecting her thumb and that in the right elbow.  She saw her PCP office and started treatment with prednisone with initial improvement of symptoms.  In follow-up uric acid was found to remain elevated so started allopurinol treatment initially at 100 mg daily.  She was also started on colchicine 0.6 mg daily for flare prophylaxis.  Initial follow-up with repeat uric acid still above  6 so allopurinol titrated to 150 mg and has been taking this dose for less than 1 month but uric acid did improve to 5.2.  So far she has not suffered any repeat episode after the right elbow inflammation.  Prior to onset of the initial flares with the lower extremities she was started on new medication by her cardiologist no other preceding illness or medical events.  She was seen in GI clinic for melena with upper GI ulcers identified and on long term anticoagulation for Afib. She was previously treated with indomethacin for inflammatory arthritis but not taking any NSAIDs at this time. Also with some peripheral neuropathic pain partially improved with  gabapentin.  She reports a history of gout with 1 episode of podagra that occurred in her 64s.  She had a hysterectomy at age 55.  No history of kidney stones.  Her metabolic panel have shown mild renal impairment with estimated GFR in high 50s and stable.     Labs reviewed 11/2021 Uric acid 5.2 eGFR 53.98   10/2021 Uric acid 6.2 eGFR 53.39   09/2021 Uric acid 7.4 eGFR 55.36   Review of Systems  Constitutional:  Positive for fatigue.  HENT:  Negative for mouth sores and mouth dryness.   Eyes:  Positive for dryness.  Respiratory:  Positive for shortness of breath.   Cardiovascular:  Positive for chest pain and palpitations.  Gastrointestinal:  Negative for blood in stool, constipation and diarrhea.  Endocrine: Negative for increased urination.  Genitourinary:  Negative for involuntary urination.  Musculoskeletal:  Positive for joint pain and joint pain. Negative for gait problem, joint swelling, myalgias, muscle weakness, morning stiffness, muscle tenderness and myalgias.  Skin:  Negative for color change, rash, hair loss and sensitivity to sunlight.  Allergic/Immunologic: Negative for susceptible to infections.  Neurological:  Negative for dizziness and headaches.  Hematological:  Negative for swollen glands.  Psychiatric/Behavioral:  Positive for sleep disturbance. Negative for depressed mood. The patient is not nervous/anxious.     PMFS History:  Patient Active Problem List   Diagnosis Date Noted   Bradycardia 12/12/2022   Difficulty sleeping 06/10/2022   Trigger finger, left middle finger 06/01/2022   Diverticular disease of colon 10/13/2021   Epigastric pain 10/13/2021   Gastroesophageal reflux disease 10/13/2021   New onset of headaches after age 63 10/13/2021   Melena 09/15/2021   Gout 06/08/2021   Itching 06/08/2021   Allergic rhinitis 06/08/2021   Cutaneous skin tags 10/27/2020   Prediabetes 04/29/2020   Rotator cuff impingement syndrome of left shoulder  01/17/2018   Neuropathy 01/13/2017   Atrial fibrillation (HCC)    Essential hypertension    Non-alcoholic fatty liver disease 03/03/2015   Thyroid nodule 01/26/2015   CAD (coronary artery disease) 01/26/2015   Obesity 09/05/2010   Hyperlipidemia 04/14/2010    Past Medical History:  Diagnosis Date   Chest pain    a. Neg stress tests - 2005, 2008, 2009;  b. 08/2014 Cardiac CT: Ca score 193 (80th%'ile).   Essential hypertension    Hiatal hernia    a. 08/2014 CT chest: large hiatal hernia.   History of DVT (deep vein thrombosis)    Hyperlipidemia    a. statin intolerant;  b. on zetia and repatha.   Palpitations    a. 02/2015 Event Monitor: NSR w/ rare, short episodes of A Tach, rare short runs of SVT < 15 beats.    Family History  Problem Relation Age of Onset   Heart disease Mother  Heart disease Father    Hyperlipidemia Sister    Hypothyroidism Sister    Thyroid cancer Brother    Heart disease Brother    Stroke Brother    Colon cancer Other        Grandparent   Past Surgical History:  Procedure Laterality Date   CATARACT EXTRACTION, BILATERAL     LAPAROSCOPIC APPENDECTOMY N/A 12/02/2015   Procedure: APPENDECTOMY LAPAROSCOPIC;  Surgeon: Berna Bue, MD;  Location: [facility] OR;  Service: General;  Laterality: N/A;   ROOT CANAL  2013   ROOT CANAL     VAGINAL HYSTERECTOMY     abdominal hysterectomy   Social History   Social History Narrative   Not on file   Immunization History  Administered Date(s) Administered   Fluad Quad(high Dose 65+) 04/08/2019, 04/20/2020, 02/17/2021, 12/10/2021   Hepatitis B, ADULT 03/17/2015, 04/21/2015, 09/15/2015   Influenza, High Dose Seasonal PF 01/13/2017, 01/17/2018   Influenza,inj,Quad PF,6+ Mos 03/03/2015, 02/17/2016   Influenza-Unspecified 12/10/2021   Moderna Sars-Covid-2 Vaccination 05/09/2019, 06/03/2019, 01/24/2020, 07/13/2020   Pneumococcal Conjugate-13 07/14/2016   Pneumococcal Polysaccharide-23 04/29/2020   Zoster  Recombinant(Shingrix) 06/03/2021, 11/08/2021     Objective: Vital Signs: BP 128/71 (BP Location: Left Arm, Patient Position: Sitting, Cuff Size: Normal)   Pulse (!) 59   Resp 16   Ht 5\' 5"  (1.651 m)   Wt 200 lb (90.7 kg)   BMI 33.28 kg/m    Physical Exam Constitutional:      Appearance: She is obese.  Eyes:     Conjunctiva/sclera: Conjunctivae normal.  Cardiovascular:     Rate and Rhythm: Normal rate and regular rhythm.  Pulmonary:     Effort: Pulmonary effort is normal.     Breath sounds: Normal breath sounds.  Skin:    General: Skin is warm and dry.     Findings: No rash.  Neurological:     Mental Status: She is alert.  Psychiatric:        Mood and Affect: Mood normal.      Musculoskeletal Exam:  Shoulders full ROM no tenderness or swelling Elbows full ROM no tenderness or swelling Wrists full ROM no tenderness or swelling Right first IP joint with tenderness and mild swelling is present, right third digit with bony joint widening and limited range of motion, left third finger with triggering from a fully flexed position, no palpable flexor tendon nodule Knees full ROM no tenderness or swelling Ankles full ROM no tenderness or swelling MTPs full ROM no tenderness or swelling  Investigation: No additional findings.  Imaging: US Guided Needle Placement  Result Date: 12/12/2022 Ultrasound-guided left third digit trigger finger injection ultrasound guided injection is preferred based studies that show increased duration, increased effect, greater accuracy, decreased procedural pain, increased response rate, and decreased cost with ultrasound guided versus blind injection.   Verbal informed consent obtained.  Time-out conducted.  Noted no overlying erythema, induration, or other signs of local infection. Ultrasound-guided (location left 3rd digit A1 pulley) After sterile prep with Betadine, injected 0.5 mL 1% lidocaine and 20 mg Kenalog using a 27g needle, (distal, volar  approach). Images 1-2 show advancement of needle to the tendon sheath superficial to flexor tendon.  Image 3 shows beginning injection of medication.  Images 4-7 show continue junction medication with extension proximally along tendon sheath.  Images 8-10 show repositioning of the needle more distally with injection of remaining volume.   Recent Labs: Lab Results  Component Value Date   WBC 5.1 06/10/2022  HGB 11.9 (L) 06/10/2022   PLT 223.0 06/10/2022   NA 140 06/10/2022   K 4.0 06/10/2022   CL 103 06/10/2022   CO2 28 06/10/2022   GLUCOSE 98 06/10/2022   BUN 19 06/10/2022   CREATININE 0.97 06/10/2022   BILITOT 0.6 06/10/2022   ALKPHOS 67 06/10/2022   AST 18 06/10/2022   ALT 14 06/10/2022   PROT 6.4 06/10/2022   ALBUMIN 3.7 06/10/2022   CALCIUM 9.1 06/10/2022   GFRAA 67 04/20/2020    Speciality Comments: No specialty comments available.  Procedures:  Hand/UE Inj: L long A1 for trigger finger on 12/05/2022 1:40 PM Indications: pain Details: 27 G needle, ultrasound-guided volar approach Medications: 0.5 mL lidocaine 1 %; 20 mg triamcinolone acetonide 40 MG/ML Outcome: tolerated well, no immediate complications Immediately prior to procedure a time out was called to verify the correct patient, procedure, equipment, support staff and site/side marked as required. Patient was prepped and draped in the usual sterile fashion.     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 Colchicine but keep the medicine on hand as needed.06/10/2022 Uric Acid 5.3  Gout appears to be well-controlled last uric acid of 5.3 and no interval change in diuretics or other associated medication.  I am not sure that the increased pain from Praluent is truly a gout flare but does sound like some type of medication intolerance.  Has not been needing colchicine  for flares.  Continue allopurinol 150 mg daily.  Acute idiopathic gout of foot, unspecified laterality Pain in right foot  No significant pain or swelling in foot today no serious interval flareup reported.  Trigger finger, left middle finger - Plan: US Guided Needle Placement  Left third digit trigger finger has been persistently symptomatic since her visit 6 months ago.  Has not improved significantly with conservative measures such as nighttime splinting and range of motion exercise.  Proceeded with ultrasound-guided steroid injection today.  Orders: Orders Placed This Encounter  Procedures   Hand/UE Inj: L long A1   US Guided Needle Placement   No orders of the defined types were placed in this encounter.    Follow-Up Instructions: Return in about 6 months (around 06/04/2023) for Gout/Trigger finger allopurinol/inj f/u 6mos.   Fuller Plan, MD  Note - This record has been created using [employer].  Chart creation errors have been sought, but may not always  have been located. Such creation errors do not reflect on  the standard of medical care.

## 2022-11-27 DIAGNOSIS — E119 Type 2 diabetes mellitus without complications: Secondary | ICD-10-CM

## 2022-11-27 HISTORY — DX: Type 2 diabetes mellitus without complications: E11.9

## 2022-12-05 ENCOUNTER — Encounter: Payer: Self-pay | Admitting: Internal Medicine

## 2022-12-05 ENCOUNTER — Ambulatory Visit: Payer: Medicare Other | Attending: Internal Medicine | Admitting: Internal Medicine

## 2022-12-05 ENCOUNTER — Ambulatory Visit: Payer: Medicare Other

## 2022-12-05 VITALS — BP 128/71 | HR 59 | Resp 16 | Ht 65.0 in | Wt 200.0 lb

## 2022-12-05 DIAGNOSIS — M65332 Trigger finger, left middle finger: Secondary | ICD-10-CM | POA: Diagnosis not present

## 2022-12-05 DIAGNOSIS — Z5181 Encounter for therapeutic drug level monitoring: Secondary | ICD-10-CM | POA: Insufficient documentation

## 2022-12-05 DIAGNOSIS — M1A2790 Drug-induced chronic gout, unspecified ankle and foot, without tophus (tophi): Secondary | ICD-10-CM | POA: Diagnosis not present

## 2022-12-05 DIAGNOSIS — M10079 Idiopathic gout, unspecified ankle and foot: Secondary | ICD-10-CM | POA: Insufficient documentation

## 2022-12-05 DIAGNOSIS — M79671 Pain in right foot: Secondary | ICD-10-CM | POA: Insufficient documentation

## 2022-12-12 ENCOUNTER — Encounter: Payer: Self-pay | Admitting: Family Medicine

## 2022-12-12 ENCOUNTER — Ambulatory Visit (INDEPENDENT_AMBULATORY_CARE_PROVIDER_SITE_OTHER): Payer: Medicare Other | Admitting: Family Medicine

## 2022-12-12 VITALS — BP 122/76 | HR 53 | Temp 97.9°F | Ht 65.0 in | Wt 197.4 lb

## 2022-12-12 DIAGNOSIS — I10 Essential (primary) hypertension: Secondary | ICD-10-CM

## 2022-12-12 DIAGNOSIS — R7303 Prediabetes: Secondary | ICD-10-CM

## 2022-12-12 DIAGNOSIS — M65332 Trigger finger, left middle finger: Secondary | ICD-10-CM

## 2022-12-12 DIAGNOSIS — I480 Paroxysmal atrial fibrillation: Secondary | ICD-10-CM | POA: Diagnosis not present

## 2022-12-12 DIAGNOSIS — R001 Bradycardia, unspecified: Secondary | ICD-10-CM | POA: Diagnosis not present

## 2022-12-12 LAB — TSH: TSH: 2.05 u[IU]/mL (ref 0.35–5.50)

## 2022-12-12 LAB — HEMOGLOBIN A1C: Hgb A1c MFr Bld: 6.7 % — ABNORMAL HIGH (ref 4.6–6.5)

## 2022-12-12 MED ORDER — LIDOCAINE HCL 1 % IJ SOLN
0.5000 mL | INTRAMUSCULAR | Status: AC | PRN
Start: 2022-12-05 — End: 2022-12-05
  Administered 2022-12-05: .5 mL

## 2022-12-12 MED ORDER — TRIAMCINOLONE ACETONIDE 40 MG/ML IJ SUSP
20.0000 mg | INTRAMUSCULAR | Status: AC | PRN
Start: 2022-12-05 — End: 2022-12-05
  Administered 2022-12-05: 20 mg

## 2022-12-12 MED ORDER — METOPROLOL SUCCINATE ER 25 MG PO TB24: ORAL_TABLET | ORAL | Status: DC

## 2022-12-12 NOTE — Patient Instructions (Addendum)
Nice to see you. Will get lab work today and contact you with the results. Please reduce your metoprolol to half a tablet once daily. Once I get your labs back I will let Dr. Mariah Milling know what is been going on.  If you are persistently in the 40s with your heart rate in the future please be evaluated while this is going on. Please let us know if your blood pressure trends up with the dose reduction your metoprolol.

## 2022-12-12 NOTE — Assessment & Plan Note (Signed)
Noted recently.  Discussed this could have been her while treating this and appropriately (though that seems unlikely given her symptoms), her atrial fibrillation, related to her metoprolol, or related to some other cardiac issue.  EKG to be performed today.  Will check a TSH.  Will reduce her metoprolol to 12.5 mg daily.  Once I have her labs back we will let cardiology know about this issue.

## 2022-12-12 NOTE — Assessment & Plan Note (Signed)
Chronic issue.  She will see how this does with her injection.

## 2022-12-12 NOTE — Assessment & Plan Note (Signed)
Chronic issue.  Intermittent issues with A-fib.  She will continue Eliquis 5 mg twice daily.  We are going to have her reduce metoprolol to 12.5 mg daily and see if this helps with her bradycardia.  She has flecainide through cardiology.  She will continue to see cardiology as well.

## 2022-12-12 NOTE — Assessment & Plan Note (Signed)
Chronic issue.  Adequately controlled.  Patient will continue losartan 100 mg daily and will reduce metoprolol to 12.5 mg daily.  She will let us know if her blood pressure trends up with this change.

## 2022-12-12 NOTE — Progress Notes (Signed)
Marikay Alar, MD Phone: [phone]  Dawn Herring is a 77 y.o. female who presents today for follow-up.  Atrial fibrillation: Gets this twice a month.  Does feel palpitations when she gets this.  She takes flecainide as needed when she gets an episode of A-fib.  She takes metoprolol 25 mg once daily.  She is also on Eliquis.  She notes no bleeding issues.  She notes for a couple of days recently she had her heart rate in the 40s and she felt poorly with this.  Hypertension: Notes her blood pressures have been good.  Taking Lasix, losartan, and metoprolol.  No chest pain.  Has chronic dyspnea with long distance exertion.  No edema.  Trigger finger: She had her left middle trigger finger injected recently through rheumatology.  Social History   Tobacco Use  Smoking Status Never   Passive exposure: Never  Smokeless Tobacco Never    Current Outpatient Medications on File Prior to Visit  Medication Sig Dispense Refill   allopurinol (ZYLOPRIM) 100 MG tablet TAKE 1.5 TABLETS (150 MG TOTAL) BY MOUTH DAILY 135 tablet 1   CALCIUM-MAGNESIUM-ZINC PO Take by mouth daily.     cyanocobalamin (VITAMIN B12) 1000 MCG tablet Take 1,000 mcg by mouth daily.     ELIQUIS 5 MG TABS tablet TAKE 1 TABLET BY MOUTH TWICE A DAY 180 tablet 1   ezetimibe (ZETIA) 10 MG tablet Take 1 tablet (10 mg total) by mouth daily. 90 tablet 3   flecainide (TAMBOCOR) 50 MG tablet TAKE 1 TABLET BY MOUTH TWICE A DAY AS NEEDED 180 tablet 3   furosemide (LASIX) 20 MG tablet TAKE 1 TABLET BY MOUTH EVERY DAY 90 tablet 1   losartan (COZAAR) 100 MG tablet Take 1 tablet (100 mg total) by mouth daily. 90 tablet 3   omeprazole (PRILOSEC) 20 MG capsule TAKE 1 CAPSULE (20 MG TOTAL) BY MOUTH 2 (TWO) TIMES DAILY BEFORE A MEAL. 180 capsule 1   POTASSIUM CHLORIDE PO Take by mouth daily.     No current facility-administered medications on file prior to visit.     ROS see history of present illness  Objective  Physical Exam Vitals:    12/12/22 1042  BP: 122/76  Pulse: (!) 53  Temp: 97.9 F (36.6 C)  SpO2: 96%    BP Readings from Last 3 Encounters:  12/12/22 122/76  12/05/22 128/71  06/10/22 122/74   Wt Readings from Last 3 Encounters:  12/12/22 197 lb 6.4 oz (89.5 kg)  12/05/22 200 lb (90.7 kg)  06/10/22 194 lb 3.2 oz (88.1 kg)    Physical Exam Constitutional:      General: She is not in acute distress.    Appearance: She is not diaphoretic.  Cardiovascular:     Rate and Rhythm: Normal rate and regular rhythm.     Heart sounds: Normal heart sounds.  Pulmonary:     Effort: Pulmonary effort is normal.     Breath sounds: Normal breath sounds.  Skin:    General: Skin is warm and dry.  Neurological:     Mental Status: She is alert.    EKG: Sinus bradycardia, rate 50, negative T waves consistent with prior EKGs, no arrhythmias noted  Assessment/Plan: Please see individual problem list.  Bradycardia Assessment & Plan: Noted recently.  Discussed this could have been her while treating this and appropriately (though that seems unlikely given her symptoms), her atrial fibrillation, related to her metoprolol, or related to some other cardiac issue.  EKG to  be performed today.  Will check a TSH.  Will reduce her metoprolol to 12.5 mg daily.  Once I have her labs back we will let cardiology know about this issue.  Orders: -     EKG 12-Lead -     TSH  Paroxysmal atrial fibrillation (HCC) Assessment & Plan: Chronic issue.  Intermittent issues with A-fib.  She will continue Eliquis 5 mg twice daily.  We are going to have her reduce metoprolol to 12.5 mg daily and see if this helps with her bradycardia.  She has flecainide through cardiology.  She will continue to see cardiology as well.   Essential hypertension Assessment & Plan: Chronic issue.  Adequately controlled.  Patient will continue losartan 100 mg daily and will reduce metoprolol to 12.5 mg daily.  She will let us know if her blood pressure trends  up with this change.   Trigger finger, left middle finger Assessment & Plan: Chronic issue.  She will see how this does with her injection.   Prediabetes -     Hemoglobin A1c  Other orders -     Metoprolol Succinate ER; TAKE 1/2 TABLET BY MOUTH DAILY TAKE WITH OR IMMEDIATELY FOLLOWING A MEAL     Return in about 3 months (around 03/13/2023) for Blood pressure follow-up.   Marikay Alar, MD [facility]

## 2022-12-14 ENCOUNTER — Encounter: Payer: Self-pay | Admitting: Family Medicine

## 2022-12-14 MED ORDER — METFORMIN HCL 500 MG PO TABS: 500.0000 mg | ORAL_TABLET | Freq: Two times a day (BID) | ORAL | 3 refills | Status: DC

## 2022-12-28 ENCOUNTER — Other Ambulatory Visit: Payer: Self-pay | Admitting: Family Medicine

## 2022-12-28 DIAGNOSIS — I10 Essential (primary) hypertension: Secondary | ICD-10-CM

## 2023-02-13 ENCOUNTER — Telehealth: Payer: Self-pay | Admitting: Family Medicine

## 2023-02-13 NOTE — Telephone Encounter (Signed)
Pt scheduled Transfer of Care appointment via open scheduling portal. Pt wanting to transfer from Dr. Birdie Sons to Dr. Veto Kemps. No reason noted. Please advise.

## 2023-02-13 NOTE — Telephone Encounter (Signed)
I am leaving [place] at the end of January.  I suspect this is why she is asking to transfer care.

## 2023-03-03 ENCOUNTER — Encounter: Payer: Self-pay | Admitting: Family Medicine

## 2023-03-03 ENCOUNTER — Telehealth: Payer: Self-pay

## 2023-03-03 ENCOUNTER — Ambulatory Visit (INDEPENDENT_AMBULATORY_CARE_PROVIDER_SITE_OTHER): Payer: Medicare Other | Admitting: Family Medicine

## 2023-03-03 VITALS — BP 136/70 | HR 67 | Temp 97.0°F | Ht 65.0 in | Wt 192.6 lb

## 2023-03-03 DIAGNOSIS — K760 Fatty (change of) liver, not elsewhere classified: Secondary | ICD-10-CM

## 2023-03-03 DIAGNOSIS — Z7984 Long term (current) use of oral hypoglycemic drugs: Secondary | ICD-10-CM | POA: Diagnosis not present

## 2023-03-03 DIAGNOSIS — E782 Mixed hyperlipidemia: Secondary | ICD-10-CM

## 2023-03-03 DIAGNOSIS — Z23 Encounter for immunization: Secondary | ICD-10-CM

## 2023-03-03 DIAGNOSIS — E538 Deficiency of other specified B group vitamins: Secondary | ICD-10-CM | POA: Diagnosis not present

## 2023-03-03 DIAGNOSIS — I10 Essential (primary) hypertension: Secondary | ICD-10-CM | POA: Diagnosis not present

## 2023-03-03 DIAGNOSIS — E119 Type 2 diabetes mellitus without complications: Secondary | ICD-10-CM

## 2023-03-03 DIAGNOSIS — G720 Drug-induced myopathy: Secondary | ICD-10-CM | POA: Insufficient documentation

## 2023-03-03 DIAGNOSIS — I480 Paroxysmal atrial fibrillation: Secondary | ICD-10-CM

## 2023-03-03 DIAGNOSIS — N1831 Chronic kidney disease, stage 3a: Secondary | ICD-10-CM | POA: Insufficient documentation

## 2023-03-03 LAB — LIPID PANEL
Cholesterol: 258 mg/dL — ABNORMAL HIGH (ref 0–200)
HDL: 47.3 mg/dL (ref >39.00)
LDL Cholesterol: 153 mg/dL — ABNORMAL HIGH (ref 0–99)
NonHDL: 210.86
Total CHOL/HDL Ratio: 5
Triglycerides: 290 mg/dL — ABNORMAL HIGH (ref 0.0–149.0)
VLDL: 58 mg/dL — ABNORMAL HIGH (ref 0.0–40.0)

## 2023-03-03 LAB — URINALYSIS, ROUTINE W REFLEX MICROSCOPIC
Hgb urine dipstick: NEGATIVE
Ketones, ur: NEGATIVE
Nitrite: NEGATIVE
Specific Gravity, Urine: 1.025 (ref 1.000–1.030)
Urine Glucose: NEGATIVE
Urobilinogen, UA: 0.2 (ref 0.0–1.0)
pH: 6 (ref 5.0–8.0)

## 2023-03-03 LAB — COMPREHENSIVE METABOLIC PANEL
ALT: 15 U/L (ref 0–35)
AST: 21 U/L (ref 0–37)
Albumin: 4.3 g/dL (ref 3.5–5.2)
Alkaline Phosphatase: 60 U/L (ref 39–117)
BUN: 21 mg/dL (ref 6–23)
CO2: 27 meq/L (ref 19–32)
Calcium: 9.4 mg/dL (ref 8.4–10.5)
Chloride: 104 meq/L (ref 96–112)
Creatinine, Ser: 0.96 mg/dL (ref 0.40–1.20)
GFR: 56.88 mL/min — ABNORMAL LOW (ref >60.00)
Glucose, Bld: 92 mg/dL (ref 70–99)
Potassium: 3.9 meq/L (ref 3.5–5.1)
Sodium: 139 meq/L (ref 135–145)
Total Bilirubin: 0.5 mg/dL (ref 0.2–1.2)
Total Protein: 7.2 g/dL (ref 6.0–8.3)

## 2023-03-03 LAB — VITAMIN B12: Vitamin B-12: >1537 pg/mL — ABNORMAL HIGH (ref 211–911)

## 2023-03-03 LAB — MICROALBUMIN / CREATININE URINE RATIO
Creatinine,U: 254.3 mg/dL
Microalb Creat Ratio: 2.6 mg/g (ref 0.0–30.0)
Microalb, Ur: 6.7 mg/dL — ABNORMAL HIGH (ref 0.0–1.9)

## 2023-03-03 LAB — HEMOGLOBIN A1C: Hgb A1c MFr Bld: 6.6 % — ABNORMAL HIGH (ref 4.6–6.5)

## 2023-03-03 NOTE — Assessment & Plan Note (Signed)
Intolerant of statins and unable to take alirocumab. I will reassess lipids today. Continue ezetimibe 10 mg daily.

## 2023-03-03 NOTE — Assessment & Plan Note (Signed)
I will check annual DM labs today. I reviewed with Dawn Herring the diagnostic criteria for diabetes and confirmed her labs are consistent with this diagnosis. Continue metformin 500 mg daily.

## 2023-03-03 NOTE — Assessment & Plan Note (Signed)
Intermittent atrial fibrillation. Continue metoprolol succinate 25 mg 1/2 tab daily for rate control, flecainide 50 mg PRN for rhythm control, and apixaban 5 mg bid for stroke prevention.

## 2023-03-03 NOTE — Patient Instructions (Signed)
Visit Information  Thank you for taking time to visit with me today. Please don't hesitate to contact me if I can be of assistance to you.   Following are the goals we discussed today:   Goals Addressed             This Visit's Progress    COMPLETED: Care Coordination Activities- No follow up required       Care Coordination Interventions: Discussed services and support. Advised to discuss with primary care physician if services needed in the future.         If you are experiencing a Mental Health or Behavioral Health Crisis or need someone to talk to, please call the Suicide and Crisis Lifeline: 988   Patient verbalizes understanding of instructions and care plan provided today and agrees to view in MyChart. Active MyChart status and patient understanding of how to access instructions and care plan via MyChart confirmed with patient.     The patient has been provided with contact information for the care management team and has been advised to call with any health related questions or concerns.   Bary Leriche, RN, MSN RN Care Manager [facility], Population Health Direct Dial: [phone]  Fax: [phone] Website: [email or web address]

## 2023-03-03 NOTE — Assessment & Plan Note (Signed)
Blood pressure is mildly high today, but has been in good control. Continue losartan 100 mg daily.

## 2023-03-03 NOTE — Assessment & Plan Note (Signed)
I will reassess LFTs today.

## 2023-03-03 NOTE — Patient Outreach (Signed)
  Care Coordination   In Person Provider Office Visit Note   03/03/2023 Name: Dawn Herring MRN: [id number] DOB: 1945-08-08  Dawn Herring is a 77 y.o. year old female who sees Dawn Herring, Dawn Mao, MD for primary care. I engaged with Dawn Herring in the providers office today.  What matters to the patients health and wellness today?  none    Goals Addressed             This Visit's Progress    COMPLETED: Care Coordination Activities- No follow up required       Care Coordination Interventions: Discussed services and support. Advised to discuss with primary care physician if services needed in the future.         SDOH assessments and interventions completed:  Yes  SDOH Interventions Today    Flowsheet Row Most Recent Value  SDOH Interventions   Food Insecurity Interventions Intervention Not Indicated  Housing Interventions Intervention Not Indicated        Care Coordination Interventions:  Yes, provided   Follow up plan: No further intervention required.   Encounter Outcome:  Patient Visit Completed   Dawn Leriche, RN, MSN RN Care Manager [facility], Population Health Direct Dial: [phone]  Fax: [phone] Website: [email or web address]

## 2023-03-03 NOTE — Assessment & Plan Note (Signed)
I will reassess the Vitamin B12 level today, esp. in light of starting on metformin.

## 2023-03-03 NOTE — Progress Notes (Signed)
[facility] [address] [place] [address] Dept: [phone] Dept Fax: [phone]  Transfer of Care Office Visit  Subjective:    Patient ID: Dawn Herring, female    DOB: 12-Nov-1945, 77 y.o..   MRN: [id number]  Chief Complaint  Patient presents with   Establish Care    [facility]- establish care.  Fasting today.   C/o having A-Fib off/on.  Will get flu shot.     History of Present Illness:  Patient is in today to establish care. Dawn Herring was born in [place], [place]. 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 She has been married for 56 years. She has no children. She denies tobacco or alcohol use.  Dawn Herring has hypertension. She is managed on losartan 100 mg daily.  Dawn Herring has atrial fibrillation. She notes periodic episodes, primarily occurring at night. She is prescribed metoprolol succinate 25 mg 1/2 tab daily for rate control. She also takes intermittent flecainide 50 mg for rhythm control when she flips into a fib. She is on apixaban 5 mg bid for stroke prevention.  Dawn Herring has periodic lower leg edema. She takes furosemide 20 mg daily and uses a potassium supplement.  Dawn Herring has a prior history of gout. She is manage on allopurinol 100 mg q am and 150 mg q pm. This was apparently worsened by use of HCTZ and alirocumab (Praluent).  Dawn Herring has hyperlipidemia. She has not been able to tolerate statins, developing both muscle pain and weakness. She had been on Praluent for years, but then developed gout. Dr. Dimple Casey felt this was likely a trigger for this. She is now on ezetimibe 10 mg daily.  Dawn Herring notes that she was recently told she had Type 2 diabetes. She notes that she never had the opportunity to discuss this with her previous PCP. She was prescribed metformin 500 mg daily, which she is  taking.  Past Medical History: Patient Active Problem List   Diagnosis Date Noted   Vitamin B12 deficiency 03/03/2023   Statin myopathy 03/03/2023   Bradycardia 12/12/2022   Difficulty sleeping 06/10/2022   Trigger finger, left middle finger 06/01/2022   Diverticular disease of colon 10/13/2021   Epigastric pain 10/13/2021   Gastroesophageal reflux disease 10/13/2021   New onset of headaches after age 60 10/13/2021   Melena 09/15/2021   Gout 06/08/2021   Itching 06/08/2021   Allergic rhinitis 06/08/2021   Cutaneous skin tags 10/27/2020   Type 2 diabetes mellitus (HCC) 04/29/2020   Rotator cuff impingement syndrome of left shoulder 01/17/2018   Idiopathic neuropathy 01/13/2017   Atrial fibrillation (HCC)    Essential hypertension    Non-alcoholic fatty liver disease 03/03/2015   Thyroid nodule 01/26/2015   CAD (coronary artery disease) 01/26/2015   Obesity 09/05/2010   Hyperlipidemia 04/14/2010   Past Surgical History:  Procedure Laterality Date   ABDOMINAL EXPLORATION SURGERY     CATARACT EXTRACTION, BILATERAL     LAPAROSCOPIC APPENDECTOMY N/A 12/02/2015   Procedure: APPENDECTOMY LAPAROSCOPIC;  Surgeon: Berna Bue, MD;  Location: [facility] OR;  Service: General;  Laterality: N/A;   ROOT CANAL  2013   ROOT CANAL     VAGINAL HYSTERECTOMY     abdominal hysterectomy   Family History  Problem Relation Age of Onset   Heart disease Mother    Stroke Father  Heart disease Father    Hyperlipidemia Sister    Hypothyroidism Sister    Cancer Brother        Thyroid   Heart disease Brother    Stroke Brother    Diabetes Maternal Aunt    Cancer Paternal Grandmother        Colon   Colon cancer Other        Grandparent   Outpatient Medications Prior to Visit  Medication Sig Dispense Refill   allopurinol (ZYLOPRIM) 100 MG tablet TAKE 1.5 TABLETS (150 MG TOTAL) BY MOUTH DAILY 135 tablet 1   CALCIUM-MAGNESIUM-ZINC PO Take by mouth daily.     cyanocobalamin (VITAMIN B12) 1000  MCG tablet Take 1,000 mcg by mouth daily.     ELIQUIS 5 MG TABS tablet TAKE 1 TABLET BY MOUTH TWICE A DAY 180 tablet 1   ezetimibe (ZETIA) 10 MG tablet Take 1 tablet (10 mg total) by mouth daily. 90 tablet 3   flecainide (TAMBOCOR) 50 MG tablet TAKE 1 TABLET BY MOUTH TWICE A DAY AS NEEDED 180 tablet 3   furosemide (LASIX) 20 MG tablet TAKE 1 TABLET BY MOUTH EVERY DAY 90 tablet 1   losartan (COZAAR) 100 MG tablet Take 1 tablet (100 mg total) by mouth daily. 90 tablet 3   metFORMIN (GLUCOPHAGE) 500 MG tablet Take 1 tablet (500 mg total) by mouth 2 (two) times daily with a meal. 60 tablet 3   metoprolol succinate (TOPROL-XL) 25 MG 24 hr tablet TAKE 1/2 TABLET BY MOUTH DAILY TAKE WITH OR IMMEDIATELY FOLLOWING A MEAL     omeprazole (PRILOSEC) 20 MG capsule TAKE 1 CAPSULE (20 MG TOTAL) BY MOUTH 2 (TWO) TIMES DAILY BEFORE A MEAL. 180 capsule 1   POTASSIUM CHLORIDE PO Take by mouth daily.     No facility-administered medications prior to visit.   Allergies  Allergen Reactions   Statins     REACTION: Has terrible reactions; abdominal pain      Objective:   Today's Vitals   03/03/23 0942  BP: 136/70  Pulse: 67  Temp: (!) 97 F (36.1 C)  TempSrc: Temporal  SpO2: 97%  Weight: 192 lb 9.6 oz (87.4 kg)  Height: 5\' 5"  (1.651 m)   Body mass index is 32.05 kg/m.   General: Well developed, well nourished. No acute distress. Psych: Alert and oriented. Normal mood and affect.  Health Maintenance Due  Topic Date Due   FOOT EXAM  Never done   OPHTHALMOLOGY EXAM  Never done   Diabetic kidney evaluation - Urine ACR  Never done   DTaP/Tdap/Td (1 - Tdap) Never done   INFLUENZA VACCINE  10/27/2022     Assessment & Plan:   Problem List Items Addressed This Visit       Cardiovascular and Mediastinum   Essential hypertension - Primary (Chronic)    Blood pressure is mildly high today, but has been in good control. Continue losartan 100 mg daily.       Atrial fibrillation (HCC)     Intermittent atrial fibrillation. Continue metoprolol succinate 25 mg 1/2 tab daily for rate control, flecainide 50 mg PRN for rhythm control, and apixaban 5 mg bid for stroke prevention.        Digestive   Non-alcoholic fatty liver disease    I will reassess LFTs today.      Relevant Orders   Comprehensive metabolic panel     Endocrine   Type 2 diabetes mellitus (HCC)    I will check annual DM  labs today. I reviewed with Ms. Brogden the diagnostic criteria for diabetes and confirmed her labs are consistent with this diagnosis. Continue metformin 500 mg daily.      Relevant Orders   Microalbumin / creatinine urine ratio   Hemoglobin A1c   Urinalysis, Routine w reflex microscopic   Comprehensive metabolic panel   Amb Referral to [facility]     Musculoskeletal and Integument   Statin myopathy     Other   Hyperlipidemia (Chronic)    Intolerant of statins and unable to take alirocumab. I will reassess lipids today. Continue ezetimibe 10 mg daily.      Relevant Orders   Lipid panel   Vitamin B12 deficiency    I will reassess the Vitamin B12 level today, esp. in light of starting on metformin.      Relevant Orders   Vitamin B12   Other Visit Diagnoses     Need for immunization against influenza       Relevant Orders   Flu Vaccine Trivalent High Dose (Fluad) (Completed)       Return in about 3 months (around 06/01/2023) for Reassessment.   Loyola Mast, MD

## 2023-03-08 ENCOUNTER — Other Ambulatory Visit: Payer: Self-pay | Admitting: Family Medicine

## 2023-03-09 ENCOUNTER — Ambulatory Visit: Payer: Medicare Other | Admitting: Family Medicine

## 2023-03-14 ENCOUNTER — Ambulatory Visit: Payer: Medicare Other | Admitting: Family Medicine

## 2023-03-30 ENCOUNTER — Encounter: Payer: Medicare Other | Attending: Family Medicine | Admitting: Dietician

## 2023-03-30 VITALS — Ht 65.0 in | Wt 195.0 lb

## 2023-03-30 DIAGNOSIS — E1122 Type 2 diabetes mellitus with diabetic chronic kidney disease: Secondary | ICD-10-CM | POA: Insufficient documentation

## 2023-03-30 DIAGNOSIS — N183 Chronic kidney disease, stage 3 unspecified: Secondary | ICD-10-CM | POA: Diagnosis not present

## 2023-04-04 ENCOUNTER — Encounter: Payer: Self-pay | Admitting: Dietician

## 2023-04-04 NOTE — Progress Notes (Signed)
 Patient was seen on 03/30/2023 for the first of a series of three diabetes self-management courses at [facility]  Patient Education Plan per assessed needs and concerns is to attend three course education program for Diabetes Self Management Education.  A1C was 6.6 on 03/03/2023.  The following learning objectives were met by the patient during this class: Describe diabetes, types of diabetes and pathophysiology State some common risk factors for diabetes Defines the role of glucose and insulin Describe the relationship between diabetes and cardiovascular and other risks State the members of the Healthcare Team States the rationale for glucose monitoring and when to test State their individual Target Range State the importance of logging glucose readings and how to interpret the readings Identifies A1C target Explain the correlation between A1c and eAG values State symptoms and treatment of high blood glucose and low blood glucose Explain proper technique for glucose testing and identify proper sharps disposal  Handouts given during class include: How to Thrive:  A Guide for Your Journey with Diabetes by the ADA Meal Plan Card and carbohydrate content list Dietary intake form Low Sodium Flavoring Tips Types of Fats Dining Out Label reading Snack list The diabetes portion plate Diabetes Resources A1c to eAG Conversion Chart Blood Glucose Log Diabetes Recommended Care Schedule Support Group Diabetes Success Plan Core Class Satisfaction Survey   Follow-Up Plan: Attend core 2

## 2023-04-06 ENCOUNTER — Encounter: Payer: Medicare Other | Attending: Family Medicine | Admitting: Dietician

## 2023-04-06 DIAGNOSIS — E1122 Type 2 diabetes mellitus with diabetic chronic kidney disease: Secondary | ICD-10-CM | POA: Insufficient documentation

## 2023-04-06 DIAGNOSIS — N183 Chronic kidney disease, stage 3 unspecified: Secondary | ICD-10-CM | POA: Diagnosis not present

## 2023-04-10 ENCOUNTER — Encounter: Payer: Self-pay | Admitting: Dietician

## 2023-04-10 NOTE — Progress Notes (Signed)
 Patient was seen on 04/05/2022 for the second of a series of three diabetes self-management courses at the [facility]. The following learning objectives were met by the patient during this class:  Describe the role of different macronutrients on glucose Explain how carbohydrates affect blood glucose State what foods contain the most carbohydrates Demonstrate carbohydrate counting Demonstrate how to read Nutrition Facts food label Describe effects of various fats on heart health Describe the importance of good nutrition for health and healthy eating strategies Describe techniques for managing your shopping, cooking and meal planning List strategies to follow meal plan when dining out Describe the effects of alcohol on glucose and how to use it safely  Goals:  Follow Diabetes Meal Plan as instructed  Aim to spread carbs evenly throughout the day  Aim for 3 meals per day and snacks as needed Include lean protein foods to meals/snacks  Monitor glucose levels as instructed by your doctor   Follow-Up Plan: Attend Core 3 Work towards following your personal food plan.

## 2023-04-13 ENCOUNTER — Encounter: Payer: Medicare Other | Attending: Family Medicine | Admitting: Dietician

## 2023-04-13 DIAGNOSIS — N183 Chronic kidney disease, stage 3 unspecified: Secondary | ICD-10-CM | POA: Diagnosis not present

## 2023-04-13 DIAGNOSIS — E1122 Type 2 diabetes mellitus with diabetic chronic kidney disease: Secondary | ICD-10-CM | POA: Diagnosis not present

## 2023-04-13 NOTE — Progress Notes (Signed)
Cardiology Clinic Note   Date: 04/14/2023 ID: Dawn, Herring 01-31-1946, MRN [id number]  Primary Cardiologist:  Julien Nordmann, MD  Patient Profile    Dawn Herring is a 78 y.o. female who presents to the clinic today for concerns about increased afib.     Past medical history significant for: PAF. 14-day ZIO 05/11/2020: HR 43 to 177 bpm, average 57 bpm.  A-fib occurred <1% with HR 84 to 177 bpm, average 128 bpm, longest lasting 1 hour 52 minutes with average rate 128 bpm.  A-fib detected within +/- 45 seconds of symptomatic patient events rare ectopy. Atrial tachycardia. 30-day event monitor 03/16/2015: NSR with rare short episodes of atrial tachycardia, rare short runs of SVT <15 beats. Hypertension. Hyperlipidemia. CT cardiac scoring 08/29/2014: Coronary calcium score of 193 (80th percentile). Lipid panel 03/03/2023: LDL 153, HDL 47, TG 290, total 258. T2DM. CKD stage IIIa. DVT.  In summary, patient was previously followed by [facility] heart [facility] vascular.  She established care with Dr. Mariah Milling on 04/14/2010.  She has a remote history of normal low risk Myoview and echo demonstrating EF of 60% with no significant valvular abnormalities in April 2005.  She has a history of statin intolerance in the setting of familial hypercholesterolemia a PCSK9i.  She underwent CT cardiac scoring with a calcium score of 193.  At follow-up visit late October 2016 she reported increased palpitations/tachycardia.  She wore a 30-day event monitor which showed short episodes of A. tach and rare SVT as detailed above.  During office follow-up in early January 2022 she reported more episodes of tachycardia with increased use of propranolol and flecainide.  Palpitations seem to occur at night with rates of 150 bpm.  Patient wore a 14-day ZIO which showed symptomatic A-fib as detailed above.     History of Present Illness    Dawn Herring is followed by Dr. Mariah Milling for the above outlined history.  Last  seen in the office by Dr. Mariah Milling on 05/11/2022 for routine follow-up.  She complained of elevated uric acid levels and rash on arms and behind knees.  She had stopped Vascepa and Praluent secondary to concern it was contributing to gout and rash.  Retrial of Praluent was recommended if LDL elevated at recheck with PCP in March.  No medication changes made.  Today, patient reports increased episodes of afib through December. Patient states she was started on Metformin in September. In December she began to notice episodes of afib every 3-4 days. These episodes typically occur at night waking her from sleep. She does not  have palpitations but will develop tightness between her shoulder blades. Her worst episode was on 12/25 when she was awakened in the early hours of the morning and lasted until around noon. She took 2 doses of flecainide, which is the most she has ever had to take. She has not had another episode since 1/4 and none of them have been prolonged like the one on Christmas day. She monitors her rhythm closely with her Apple watch. She has had no GI upset or other GI concerns since starting metformin. She also notes that for 2 days in September her heart rate stayed in the 40s. She did not have lightheadedness, dizziness, presyncope or syncope. Her PCP decreased Toprol. She has been nervous about exercising despite not typically having episodes of afib with exercise.  She has chronic lower extremity edema managed with Lasix and compression. Edema is always gone in the morning and progresses throughout  the day.      ROS: All other systems reviewed and are otherwise negative except as noted in History of Present Illness.  EKGs/Labs Reviewed    EKG Interpretation Date/Time:  Friday April 14 2023 10:56:48 EST Ventricular Rate:  60 PR Interval:  198 QRS Duration:  82 QT Interval:  442 QTC Calculation: 442 R Axis:   8  Text Interpretation: Normal sinus rhythm T wave abnormality, consider  inferior ischemia T wave abnormality, consider anterolateral ischemia Compared to EKG 12/12/2022 no significant changes Confirmed by Carlos Levering [phone]) on 04/14/2023 11:16:03 AM   03/03/2023: ALT 15; AST 21; BUN 21; Creatinine, Ser 0.96; Potassium 3.9; Sodium 139   06/10/2022: Hemoglobin 11.9; WBC 5.1   12/12/2022: TSH 2.05    Risk Assessment/Calculations     CHA2DS2-VASc Score = 5   This indicates a 7.2% annual risk of stroke. The patient's score is based upon: CHF History: 0 HTN History: 1 Diabetes History: 1 Stroke History: 0 Vascular Disease History: 0 Age Score: 2 Gender Score: 1             Physical Exam    VS:  BP 130/60 (BP Location: Left Arm, Patient Position: Sitting, Cuff Size: Normal)   Pulse 60   Ht 5\' 5"  (1.651 m)   Wt 191 lb (86.6 kg)   BMI 31.78 kg/m  , BMI Body mass index is 31.78 kg/m.  GEN: Well nourished, well developed, in no acute distress. Neck: No JVD or carotid bruits. Cardiac:  RRR. No murmurs. No rubs or gallops.   Respiratory:  Respirations regular and unlabored. Clear to auscultation without rales, wheezing or rhonchi. GI: Soft, nontender, nondistended. Extremities: Radials/DP/PT 2+ and equal bilaterally. No clubbing or cyanosis. Mild, nonpitting edema bilateral ankles.   Skin: Warm and dry, no rash. Neuro: Strength intact.  Assessment & Plan   PAF Patient reports increased episodes of afib through December. She was concerned this could be related to starting Metformin in September. She denies GI upset or other GI concerns since starting Metformin. She reports worst episode of afib on Christmas day starting in the early morning hours and lasting until noon. Episodes typically occur at night waking her from sleep. She does not experience palpitations when in afib but rather has tightness between her shoulder blades. She denies increased stress or increased alcohol intake during the month of December. She does report developing a  persistent, severe dry cough around 12/18 that lasted for several weeks. She did not see her PCP or urgent care for it and it has since mostly resolved. Last episode of afib on 1/4.  Denies spontaneous bleeding concerns.  EKG today shows NSR, 60 bpm. Given the episodes seem to be decreasing she agrees to defer evaluation with outpatient monitor.  -Continue Toprol, as needed flecainide, Eliquis. Appropriate Eliquis dose.  Hypertension BP today 130/60. -Continue Toprol, losartan.   Chronic lower extremity edema Patient with history of chronic lower extremity edema that is unchanged. It is managed with Lasix and compression. Edema is gone in the mornings and progresses throughout the day. Mild, nonpitting edema bilateral ankles.  -Continue Lasix and compression socks.  Hyperlipidemia/statin intolerance CT cardiac scoring June 2016 showed calcium score of 193.  LDL December 2024 153, not at goal.  Patient has a history of statin intolerance.  She was taking Vascepa and Praluent however was having issues with rash and gout so she stopped them.  She did not want to retrial Praluent. -Continue Zetia.  Disposition: Return in  3 months or sooner as needed.          Signed, Etta Grandchild. Oluwaseyi Tull, DNP, NP-C

## 2023-04-14 ENCOUNTER — Encounter: Payer: Self-pay | Admitting: Dietician

## 2023-04-14 ENCOUNTER — Encounter: Payer: Self-pay | Admitting: Student

## 2023-04-14 ENCOUNTER — Ambulatory Visit: Payer: Medicare Other | Attending: Student | Admitting: Student

## 2023-04-14 VITALS — BP 130/60 | HR 60 | Ht 65.0 in | Wt 191.0 lb

## 2023-04-14 DIAGNOSIS — I10 Essential (primary) hypertension: Secondary | ICD-10-CM

## 2023-04-14 DIAGNOSIS — Z789 Other specified health status: Secondary | ICD-10-CM

## 2023-04-14 DIAGNOSIS — R600 Localized edema: Secondary | ICD-10-CM

## 2023-04-14 DIAGNOSIS — I480 Paroxysmal atrial fibrillation: Secondary | ICD-10-CM | POA: Diagnosis not present

## 2023-04-14 DIAGNOSIS — E782 Mixed hyperlipidemia: Secondary | ICD-10-CM | POA: Diagnosis not present

## 2023-04-14 NOTE — Progress Notes (Signed)
Patient was seen on 04/13/2022 for the third of a series of three diabetes self-management courses at [facility].   State the amount of activity recommended for healthy living Describe activities suitable for individual needs Identify ways to regularly incorporate activity into daily life Identify barriers to activity and ways to over come these barriers Identify diabetes medications being personally used and their primary action for lowering glucose and possible side effects Describe role of stress on blood glucose and develop strategies to address psychosocial issues Identify diabetes complications and ways to prevent them Explain how to manage diabetes during illness Evaluate success in meeting personal goal Establish 2-3 goals that they will plan to diligently work on  Goals:  I will count my carb choices at most meals and snacks I will be active 5 minutes or more 5 times a week  and discuss with MD exercise goals  Your patient has identified these potential barriers to change:  Health problems - Afib  Your patient has identified their diabetes self-care support plan as  On-line Resources [facility] Support Group  American Diabetes Association Website   Plan:  Attend Support Group as desired

## 2023-04-14 NOTE — Patient Instructions (Signed)
Medication Instructions:  Your Physician recommend you continue on your current medication as directed.    *If you need a refill on your cardiac medications before your next appointment, please call your pharmacy*   Lab Work: None ordered at this time  If you have labs (blood work) drawn today and your tests are completely normal, you will receive your results only by: MyChart Message (if you have MyChart) OR A paper copy in the mail If you have any lab test that is abnormal or we need to change your treatment, we will call you to review the results.   Follow-Up: At [facility], you and your health needs are our priority.  As part of our continuing mission to provide you with exceptional heart care, we have created designated Provider Care Teams.  These Care Teams include your primary Cardiologist (physician) and Advanced Practice Providers (APPs -  Physician Assistants and Nurse Practitioners) who all work together to provide you with the care you need, when you need it.   Your next appointment:   3 month(s)  Provider:   You may see Julien Nordmann, MD or one of the following Advanced Practice Providers on your designated Care Team:   Carlos Levering, NP

## 2023-04-21 ENCOUNTER — Other Ambulatory Visit: Payer: Self-pay | Admitting: Internal Medicine

## 2023-04-21 DIAGNOSIS — M10079 Idiopathic gout, unspecified ankle and foot: Secondary | ICD-10-CM

## 2023-04-21 DIAGNOSIS — Z5181 Encounter for therapeutic drug level monitoring: Secondary | ICD-10-CM

## 2023-04-21 NOTE — Telephone Encounter (Signed)
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  Contacted the patient and advised she is due to update a couple of labs. Patient states she will try to come to the office Monday to get the labs done. Please review and sign lab orders.   Okay to refill Allopurinol?

## 2023-04-24 ENCOUNTER — Other Ambulatory Visit: Payer: Self-pay | Admitting: *Deleted

## 2023-04-24 DIAGNOSIS — M10079 Idiopathic gout, unspecified ankle and foot: Secondary | ICD-10-CM | POA: Diagnosis not present

## 2023-04-24 DIAGNOSIS — Z5181 Encounter for therapeutic drug level monitoring: Secondary | ICD-10-CM

## 2023-04-25 LAB — CBC WITH DIFFERENTIAL/PLATELET
Absolute Lymphocytes: 1585 {cells}/uL (ref 850–3900)
Absolute Monocytes: 588 {cells}/uL (ref 200–950)
Basophils Absolute: 32 {cells}/uL (ref 0–200)
Basophils Relative: 0.6 %
Eosinophils Absolute: 80 {cells}/uL (ref 15–500)
Eosinophils Relative: 1.5 %
HCT: 32 % — ABNORMAL LOW (ref 35.0–45.0)
Hemoglobin: 10.5 g/dL — ABNORMAL LOW (ref 11.7–15.5)
MCH: 28.3 pg (ref 27.0–33.0)
MCHC: 32.8 g/dL (ref 32.0–36.0)
MCV: 86.3 fL (ref 80.0–100.0)
MPV: 11.7 fL (ref 7.5–12.5)
Monocytes Relative: 11.1 %
Neutro Abs: 3016 {cells}/uL (ref 1500–7800)
Neutrophils Relative %: 56.9 %
Platelets: 273 10*3/uL (ref 140–400)
RBC: 3.71 10*6/uL — ABNORMAL LOW (ref 3.80–5.10)
RDW: 14.9 % (ref 11.0–15.0)
Total Lymphocyte: 29.9 %
WBC: 5.3 10*3/uL (ref 3.8–10.8)

## 2023-04-25 LAB — URIC ACID: Uric Acid, Serum: 5.3 mg/dL (ref 2.5–7.0)

## 2023-04-25 NOTE — Progress Notes (Signed)
Uric acid level is unchanged at 5.3 which is our goal of less than 6.  Hemoglobin is slightly low at 10.5 otherwise her blood counts are normal. No problem for continuing current medication.

## 2023-04-30 ENCOUNTER — Other Ambulatory Visit: Payer: Self-pay | Admitting: Cardiovascular Disease

## 2023-05-03 ENCOUNTER — Telehealth: Payer: Self-pay | Admitting: Cardiovascular Disease

## 2023-05-03 ENCOUNTER — Other Ambulatory Visit: Payer: Self-pay | Admitting: Cardiovascular Disease

## 2023-05-03 DIAGNOSIS — I10 Essential (primary) hypertension: Secondary | ICD-10-CM

## 2023-05-03 DIAGNOSIS — I480 Paroxysmal atrial fibrillation: Secondary | ICD-10-CM

## 2023-05-03 MED ORDER — LOSARTAN POTASSIUM 100 MG PO TABS: 100.0000 mg | ORAL_TABLET | Freq: Every day | ORAL | 2 refills | Status: DC

## 2023-05-03 NOTE — Telephone Encounter (Signed)
*  STAT* If patient is at the pharmacy, call can be transferred to refill team.   1. Which medications need to be refilled? (please list name of each medication and dose if known) losartan  (COZAAR ) 100 MG tablet   2. Which pharmacy/location (including street and city if local pharmacy) is medication to be sent to? [id number] - [place], [place] - [address] Phone: [phone]  Fax: [phone]      3. Do they need a 30 day or 90 day supply? 90 Pt is out of medication

## 2023-05-03 NOTE — Telephone Encounter (Signed)
 Eliquis  5mg  refill request received. Patient is 78 years old, weight-86.6kg, Crea-0.96 on 03/03/23, Diagnosis-Afib, and last seen by Morey Ar on 04/14/23. Dose is appropriate based on dosing criteria. Will send in refill to requested pharmacy.

## 2023-05-03 NOTE — Telephone Encounter (Signed)
 Requested Prescriptions   Signed Prescriptions Disp Refills   losartan  (COZAAR ) 100 MG tablet 90 tablet 2    Sig: Take 1 tablet (100 mg total) by mouth daily.    Authorizing Provider: Devorah Fonder    Ordering User: Duayne Gey

## 2023-05-12 ENCOUNTER — Other Ambulatory Visit: Payer: Self-pay | Admitting: Cardiovascular Disease

## 2023-05-14 ENCOUNTER — Other Ambulatory Visit: Payer: Self-pay | Admitting: Cardiovascular Disease

## 2023-05-14 ENCOUNTER — Other Ambulatory Visit: Payer: Self-pay | Admitting: Family Medicine

## 2023-05-14 DIAGNOSIS — I10 Essential (primary) hypertension: Secondary | ICD-10-CM

## 2023-05-23 NOTE — Progress Notes (Signed)
 Office Visit Note  Patient: Dawn Herring             Date of Birth: [birth year]           MRN: [id number]             PCP: Loyola Mast, MD Referring: Glori Luis, MD Visit Date: 06/05/2023   Subjective:  Follow-up    Discussed the use of AI scribe software for clinical note transcription with the patient, who gave verbal consent to proceed.  History of Present Illness   Dawn Herring is a 78 y.o. female here for follow up for gout on allopurinol 150 mg daily. Also for osteoarthritis of the hands and left 3rd trigger finger s/p injection in September.  She was recently diagnosed with type 2 diabetes and has not reported any specific symptoms related to this diagnosis during the visit. The condition is being managed by her primary care provider.  She was informed of anemia following blood work done at a previous visit. Additional blood work done last Thursday confirmed ongoing anemia. She has started taking oral iron supplements, specifically ferrous sulfate, once daily since Friday. She has not yet experienced any side effects such as constipation or changes in stool color.  She has a history of gout and is currently taking allopurinol, with a reduced dose of 100 mg daily. No recent flare-ups of gout have occurred, and her last uric acid level was 5.3 mg/dL as of January 40JW. She has colchicine available if needed for acute flare-ups.  She has a history of trigger finger, particularly in the left third finger, which has improved. Previously, she experienced episodes where the finger would not straighten for an hour, but this has not occurred recently. No swelling in her hands, and she has not been using a splint or performing specific stretches for the condition.  She experiences occasional soreness in her thumb, which she attributes to the weather, but denies any significant flare-ups or swelling in her feet or ankles. She uses Tylenol for pain management and has not tried  any topical treatments for her thumb pain. She mentions difficulty opening items due to thumb pain but has not used any topical medications like Voltaren for relief.   Previous HPI 12/05/2022 Dawn Herring is a 78 y.o. female here for follow up for gout on allopurinol 150 mg daily.   Since our last visit in March she had 1 episode of significantly increased joint pain and stiffness when she started on Praluent injection for her cholesterol so stopped taking this medication.  Otherwise did not experience any flareups of gouty arthritis pain in her feet and ankles.  She continues having symptoms daily with her hands worst affected area is the right thumb where she gets pain and frequently visible swelling at the distal joint.  Also has some restricted mobility in her third finger on both hands left hand consistently triggering on a daily basis.  Sometimes this will get stuck and cannot move it freely for hours without using her right hand for assistance.  She takes Tylenol as needed for joint pain cannot use other medicines because of anticoagulation for A-fib.   Previous HPI 06/01/2022 Dawn Herring is a 78 y.o. female here for follow up for gout on allopurinol 150 mg daily and colchicine 0.6 mg daily prophylaxis.  Since her last visit she is doing better with less foot pain and swelling.  She has not had any major gout flareup.  Occasionally has some pain between the first and second toe radiating into the foot.  There is a bit more difficulty with range of motion in the third finger on both hands with a lot of left-sided triggering.  She had a few episodes with A-fib back under control quickly with taking the flecainide.   Previous HPI 03/02/22 Dawn Herring is a 78 y.o. female here for follow up for gout on allopurinol 150 mg daily and colchicine prophylaxis after uric acid at goal in September. She has been generally feeling well since our last visit but has experienced foot pain and swelling starting  Monday when she first stepped out of bed. This is worst at the outside edge of the right foot. Pain is worse with walking on her foot and she started wearing her compression socks and ankle sleeve for this with some relief. Feels like early symptoms of previous gout flares. She has not experienced any repeat problems with elbow inflammation.    Previous HPI 12/30/21 Dawn Herring is a 78 y.o. female here for gout now on allopurinol 150 mg daily.  Gout episode started with pain and inflammation in the feet and ankles around June this year.  Subsequently developed increased joint pain and some swelling affecting her thumb and that in the right elbow.  She saw her PCP office and started treatment with prednisone with initial improvement of symptoms.  In follow-up uric acid was found to remain elevated so started allopurinol treatment initially at 100 mg daily.  She was also started on colchicine 0.6 mg daily for flare prophylaxis.  Initial follow-up with repeat uric acid still above 6 so allopurinol titrated to 150 mg and has been taking this dose for less than 1 month but uric acid did improve to 5.2.  So far she has not suffered any repeat episode after the right elbow inflammation.  Prior to onset of the initial flares with the lower extremities she was started on new medication by her cardiologist no other preceding illness or medical events.  She was seen in GI clinic for melena with upper GI ulcers identified and on long term anticoagulation for Afib. She was previously treated with indomethacin for inflammatory arthritis but not taking any NSAIDs at this time. Also with some peripheral neuropathic pain partially improved with gabapentin.  She reports a history of gout with 1 episode of podagra that occurred in her 44s.  She had a hysterectomy at age 60.  No history of kidney stones.  Her metabolic panel have shown mild renal impairment with estimated GFR in high 50s and stable.     Labs  reviewed 11/2021 Uric acid 5.2 eGFR 53.98   10/2021 Uric acid 6.2 eGFR 53.39   09/2021 Uric acid 7.4 eGFR 55.36   Review of Systems  Constitutional:  Positive for fatigue.  HENT:  Negative for mouth sores and mouth dryness.   Eyes:  Negative for dryness.  Respiratory:  Positive for shortness of breath.   Cardiovascular:  Positive for chest pain. Negative for palpitations.  Gastrointestinal:  Negative for blood in stool, constipation and diarrhea.  Endocrine: Negative for increased urination.  Genitourinary:  Negative for involuntary urination.  Musculoskeletal:  Positive for joint pain, joint pain and joint swelling. Negative for gait problem, myalgias, muscle weakness, morning stiffness, muscle tenderness and myalgias.  Skin:  Negative for color change, rash, hair loss and sensitivity to sunlight.  Allergic/Immunologic: Negative for susceptible to infections.  Neurological:  Negative for dizziness and headaches.  Hematological:  Negative for swollen glands.  Psychiatric/Behavioral:  Positive for sleep disturbance. Negative for depressed mood. The patient is not nervous/anxious.     PMFS History:  Patient Active Problem List   Diagnosis Date Noted   Dupuytren contracture of right hand 06/05/2023   Iron deficiency 06/02/2023   Normocytic anemia 06/01/2023   Vitamin B12 deficiency 03/03/2023   Statin myopathy 03/03/2023   Stage 3a chronic kidney disease (CKD) (HCC) 03/03/2023   Bradycardia 12/12/2022   Difficulty sleeping 06/10/2022   Trigger finger, left middle finger 06/01/2022   Diverticular disease of colon 10/13/2021   Epigastric pain 10/13/2021   Gastroesophageal reflux disease 10/13/2021   New onset of headaches after age 68 10/13/2021   Melena 09/15/2021   Gout 06/08/2021   Itching 06/08/2021   Allergic rhinitis 06/08/2021   Cutaneous skin tags 10/27/2020   Type 2 diabetes with stage 3 chronic kidney disease GFR 30-59 (HCC) 04/29/2020   Rotator cuff impingement  syndrome of left shoulder 01/17/2018   Idiopathic neuropathy 01/13/2017   Atrial fibrillation (HCC)    Essential hypertension    Non-alcoholic fatty liver disease 03/03/2015   Thyroid nodule 01/26/2015   CAD (coronary artery disease) 01/26/2015   Obesity 09/05/2010   Hyperlipidemia 04/14/2010    Past Medical History:  Diagnosis Date   Anemia    Chest pain    a. Neg stress tests - 2005, 2008, 2009;  b. 08/2014 Cardiac CT: Ca score 193 (80th%'ile).   Diabetes mellitus without complication (HCC)    Essential hypertension    Hiatal hernia    a. 08/2014 CT chest: large hiatal hernia.   History of DVT (deep vein thrombosis)    Hyperlipidemia    a. statin intolerant;  b. on zetia and repatha.   Palpitations    a. 02/2015 Event Monitor: NSR w/ rare, short episodes of A Tach, rare short runs of SVT < 15 beats.   Type 2 diabetes mellitus (HCC) 11/2022    Family History  Problem Relation Age of Onset   Heart disease Mother    Stroke Father    Heart disease Father    Hyperlipidemia Sister    Hypothyroidism Sister    Cancer Brother        Thyroid   Heart disease Brother    Stroke Brother    Diabetes Maternal Aunt    Cancer Paternal Grandmother        Colon   Colon cancer Other        Grandparent   Past Surgical History:  Procedure Laterality Date   ABDOMINAL EXPLORATION SURGERY     CATARACT EXTRACTION, BILATERAL     LAPAROSCOPIC APPENDECTOMY N/A 12/02/2015   Procedure: APPENDECTOMY LAPAROSCOPIC;  Surgeon: Berna Bue, MD;  Location: [facility] OR;  Service: General;  Laterality: N/A;   ROOT CANAL  2013   ROOT CANAL     VAGINAL HYSTERECTOMY     abdominal hysterectomy   Social History   Social History Narrative   Not on file   Immunization History  Administered Date(s) Administered   Fluad Quad(high Dose 65+) 04/08/2019, 04/20/2020, 02/17/2021, 12/10/2021   Fluad Trivalent(High Dose 65+) 03/03/2023   Hepatitis B, ADULT 03/17/2015, 04/21/2015, 09/15/2015   Influenza, High  Dose Seasonal PF 01/13/2017, 01/17/2018   Influenza,inj,Quad PF,6+ Mos 03/03/2015, 02/17/2016   Influenza-Unspecified 12/10/2021   Moderna Sars-Covid-2 Vaccination 05/09/2019, 06/03/2019, 01/24/2020, 07/13/2020   Pneumococcal Conjugate-13 07/14/2016   Pneumococcal Polysaccharide-23 04/29/2020   Zoster Recombinant(Shingrix) 06/03/2021, 11/08/2021   Zoster, Live 11/13/2012  Objective: Vital Signs: BP 124/73 (BP Location: Left Arm, Patient Position: Sitting, Cuff Size: Normal)   Pulse (!) 55   Resp 14   Ht 5\' 5"  (1.651 m)   Wt 191 lb (86.6 kg)   BMI 31.78 kg/m    Physical Exam Cardiovascular:     Rate and Rhythm: Normal rate and regular rhythm.  Pulmonary:     Effort: Pulmonary effort is normal.     Breath sounds: Normal breath sounds.  Skin:    General: Skin is warm and dry.     Findings: No rash.  Neurological:     Mental Status: She is alert.  Psychiatric:        Mood and Affect: Mood normal.      Musculoskeletal Exam:  Elbows full ROM no tenderness or swelling Wrists full ROM no tenderness or swelling Right palm with mild dupuytren contracture proximal to 4th MCP Fingers full ROM, right 1st IP joint heberdon's nodes minimal tenderness, left 3rd finger very mild triggering at MCP Knees full ROM no tenderness or swelling, bilateral patellofemoral crepitus Ankles full ROM no tenderness or swelling Negative MTP squeeze tenderness bilateral   Investigation: No additional findings.  Imaging: No results found.  Recent Labs: Lab Results  Component Value Date   WBC 6.0 06/01/2023   HGB 11.0 (L) 06/01/2023   PLT 282.0 06/01/2023   NA 139 03/03/2023   K 3.9 03/03/2023   CL 104 03/03/2023   CO2 27 03/03/2023   GLUCOSE 92 06/01/2023   BUN 21 03/03/2023   CREATININE 0.96 03/03/2023   BILITOT 0.5 03/03/2023   ALKPHOS 60 03/03/2023   AST 21 03/03/2023   ALT 15 03/03/2023   PROT 7.2 03/03/2023   ALBUMIN 4.3 03/03/2023   CALCIUM 9.4 03/03/2023   GFRAA 67  04/20/2020    Speciality Comments: No specialty comments available.  Procedures:  No procedures performed Allergies: Statins   Assessment / Plan:     Visit Diagnoses: Drug-induced chronic gout of foot without tophus, unspecified laterality - 04/24/2023 Uric Acid 5.3 No recent flare-ups. Uric acid level was 5.3 on 04/24/2023. Currently on Allopurinol 150mg  daily. -Reduce Allopurinol to 100mg  daily. -Consider discontinuing Allopurinol in 6 months if no flare-ups occur.  Thumb Osteoarthritis Pain and difficulty opening items. No significant swelling.  Symptoms highly consistent for primary osteoarthritis.  I do not think there is an acute exacerbation needing steroid injection today.  Could also consider referral to Occupational Therapy if difficulty with grip and opening items worsens. -Consider Voltaren gel for topical pain relief. -Consider steroid injection if pain worsens.  Trigger finger, left middle finger - S/P Hand/UE Inj: L long A1 for trigger finger on 12/05/2022 Improvement noted with occasional catching of the left third finger. No significant swelling. -Perform hand stretches 2-3 times daily.  Provided printed handout today  Dupuytren contracture of right hand Very mild currently not painful or limiting function and hand mobility.  Discussed risk factor with type 2 diabetes diagnosis but may simply be age-related.  Iron Deficiency Anemia Recently started on Ferrous Sulfate due to anemia. No constipation or black stools reported. -Continue Ferrous Sulfate 1 tablet daily.  Type 2 Diabetes Newly diagnosed. Noted as a risk factor for trigger finger. -No specific plan discussed in the conversation.  Follow-up in 6 months or sooner if gout flare-ups occur or trigger finger/thumb osteoarthritis worsens.     Orders: No orders of the defined types were placed in this encounter.  Meds ordered this encounter  Medications  allopurinol (ZYLOPRIM) 100 MG tablet    Sig: Take 1  tablet (100 mg total) by mouth daily.    Dispense:  90 tablet    Refill:  1     Follow-Up Instructions: Return in about 6 months (around 12/06/2023) for Gout/OA/Trigger finger f/u 6mos.   Fuller Plan, MD  Note - This record has been created using [employer].  Chart creation errors have been sought, but may not always  have been located. Such creation errors do not reflect on  the standard of medical care.

## 2023-06-01 ENCOUNTER — Ambulatory Visit: Payer: Medicare Other | Admitting: Family Medicine

## 2023-06-01 ENCOUNTER — Encounter: Payer: Self-pay | Admitting: Family Medicine

## 2023-06-01 VITALS — BP 124/68 | HR 63 | Temp 97.9°F | Ht 65.0 in | Wt 191.0 lb

## 2023-06-01 DIAGNOSIS — E611 Iron deficiency: Secondary | ICD-10-CM

## 2023-06-01 DIAGNOSIS — E1122 Type 2 diabetes mellitus with diabetic chronic kidney disease: Secondary | ICD-10-CM | POA: Diagnosis not present

## 2023-06-01 DIAGNOSIS — Z7984 Long term (current) use of oral hypoglycemic drugs: Secondary | ICD-10-CM | POA: Diagnosis not present

## 2023-06-01 DIAGNOSIS — I10 Essential (primary) hypertension: Secondary | ICD-10-CM

## 2023-06-01 DIAGNOSIS — N183 Chronic kidney disease, stage 3 unspecified: Secondary | ICD-10-CM

## 2023-06-01 DIAGNOSIS — D649 Anemia, unspecified: Secondary | ICD-10-CM

## 2023-06-01 DIAGNOSIS — G609 Hereditary and idiopathic neuropathy, unspecified: Secondary | ICD-10-CM | POA: Diagnosis not present

## 2023-06-01 DIAGNOSIS — N1831 Chronic kidney disease, stage 3a: Secondary | ICD-10-CM | POA: Diagnosis not present

## 2023-06-01 DIAGNOSIS — L602 Onychogryphosis: Secondary | ICD-10-CM | POA: Diagnosis not present

## 2023-06-01 DIAGNOSIS — I480 Paroxysmal atrial fibrillation: Secondary | ICD-10-CM

## 2023-06-01 DIAGNOSIS — E782 Mixed hyperlipidemia: Secondary | ICD-10-CM

## 2023-06-01 DIAGNOSIS — I2510 Atherosclerotic heart disease of native coronary artery without angina pectoris: Secondary | ICD-10-CM | POA: Diagnosis not present

## 2023-06-01 LAB — FOLATE: Folate: 12.9 ng/mL

## 2023-06-01 LAB — CBC
HCT: 34.5 % — ABNORMAL LOW (ref 36.0–46.0)
Hemoglobin: 11 g/dL — ABNORMAL LOW (ref 12.0–15.0)
MCHC: 31.9 g/dL (ref 30.0–36.0)
MCV: 86.7 fl (ref 78.0–100.0)
Platelets: 282 10*3/uL (ref 150.0–400.0)
RBC: 3.98 Mil/uL (ref 3.87–5.11)
RDW: 17 % — ABNORMAL HIGH (ref 11.5–15.5)
WBC: 6 10*3/uL (ref 4.0–10.5)

## 2023-06-01 LAB — HEMOGLOBIN A1C: Hgb A1c MFr Bld: 6.6 % — ABNORMAL HIGH (ref 4.6–6.5)

## 2023-06-01 LAB — VITAMIN B12: Vitamin B-12: >1537 pg/mL — ABNORMAL HIGH (ref 211–911)

## 2023-06-01 LAB — GLUCOSE, RANDOM: Glucose, Bld: 92 mg/dL (ref 70–99)

## 2023-06-01 NOTE — Assessment & Plan Note (Signed)
 Intolerant of statins and unable to take alirocumab. Lipids at high, but options are limited. Continue ezetimibe 10 mg daily.

## 2023-06-01 NOTE — Assessment & Plan Note (Signed)
 Intermittent atrial fibrillation. Continue metoprolol succinate 25 mg 1/2 tab daily for rate control, flecainide 50 mg PRN for rhythm control, and apixaban 5 mg bid for stroke prevention.

## 2023-06-01 NOTE — Assessment & Plan Note (Signed)
 Existing prior to diabetes diagnosis, so unlikely to be due to diabetes. Reviewed foot care to help prevent long-term complications.

## 2023-06-01 NOTE — Assessment & Plan Note (Signed)
 Continue focus on blood pressure and glucose control, adequate hydration, and avoidance of nephrotoxic medications.

## 2023-06-01 NOTE — Progress Notes (Signed)
 [facility] [address] [place] [address] Dept: [phone] Dept Fax: [phone]  Chronic Care Office Visit  Subjective:    Patient ID: Dawn Herring, female    DOB: 10/02/1945, 78 y.o..   MRN: [id number]  Chief Complaint  Patient presents with   Hypertension    3 month f/u.      History of Present Illness:  Patient is in today for reassessment of chronic medical issues.  [name] has hypertension. She is managed on losartan 100 mg daily.   [name] has atrial fibrillation. She notes periodic episodes, primarily occurring at night. She is prescribed metoprolol succinate 25 mg 1/2 tab daily for rate control. She also takes intermittent flecainide 50 mg for rhythm control when she flips into a fib. She is on apixaban 5 mg bid for stroke prevention. she had a cluster of several episodes of a.fib around the Christmas holiday, but this is now doing better.   [name] has periodic lower leg edema. She takes furosemide 20 mg daily and uses a potassium supplement.   [name] has a prior history of gout. She is manage on allopurinol 100 mg q am and 150 mg q pm.    [name] has hyperlipidemia. She has not been able to tolerate statins, developing both muscle pain and weakness. She had been on Praluent for years, but then developed gout. Dr. Dimple Casey felt this was likely a trigger for this. She is now on ezetimibe 10 mg daily.   [name] has Type 2 diabetes. She is managed on metformin 500 mg daily. She wonders if she should be doing home glucose testing.  Past Medical History: Patient Active Problem List   Diagnosis Date Noted   Vitamin B12 deficiency 03/03/2023   Statin myopathy 03/03/2023   Stage 3a chronic kidney disease (CKD) (HCC) 03/03/2023   Bradycardia 12/12/2022   Difficulty sleeping 06/10/2022   Trigger finger, left middle finger 06/01/2022   Diverticular disease of colon 10/13/2021   Epigastric pain  10/13/2021   Gastroesophageal reflux disease 10/13/2021   New onset of headaches after age 78 10/13/2021   Melena 09/15/2021   Gout 06/08/2021   Itching 06/08/2021   Allergic rhinitis 06/08/2021   Cutaneous skin tags 10/27/2020   Type 2 diabetes with stage 3 chronic kidney disease GFR 30-59 (HCC) 04/29/2020   Rotator cuff impingement syndrome of left shoulder 01/17/2018   Idiopathic neuropathy 01/13/2017   Atrial fibrillation (HCC)    Essential hypertension    Non-alcoholic fatty liver disease 03/03/2015   Thyroid nodule 01/26/2015   CAD (coronary artery disease) 01/26/2015   Obesity 09/05/2010   Hyperlipidemia 04/14/2010   Past Surgical History:  Procedure Laterality Date   ABDOMINAL EXPLORATION SURGERY     CATARACT EXTRACTION, BILATERAL     LAPAROSCOPIC APPENDECTOMY N/A 12/02/2015   Procedure: APPENDECTOMY LAPAROSCOPIC;  Surgeon: Berna Bue, MD;  Location: [facility] OR;  Service: General;  Laterality: N/A;   ROOT CANAL  2013   ROOT CANAL     VAGINAL HYSTERECTOMY     abdominal hysterectomy   Family History  Problem Relation Age of Onset   Heart disease Mother    Stroke Father    Heart disease Father    Hyperlipidemia Sister    Hypothyroidism Sister    Cancer Brother        Thyroid   Heart disease Brother    Stroke Brother    Diabetes Maternal Aunt    Cancer  Paternal Grandmother        Colon   Colon cancer Other        Grandparent   Outpatient Medications Prior to Visit  Medication Sig Dispense Refill   allopurinol (ZYLOPRIM) 100 MG tablet TAKE 1.5 TABLETS (150MG  TOTAL) BY MOUTH DAILY 135 tablet 0   CALCIUM-MAGNESIUM-ZINC PO Take by mouth daily.     cyanocobalamin (VITAMIN B12) 1000 MCG tablet Take 1,000 mcg by mouth daily.     ELIQUIS 5 MG TABS tablet TAKE 1 TABLET BY MOUTH TWICE A DAY 180 tablet 1   ezetimibe (ZETIA) 10 MG tablet TAKE 1 TABLET BY MOUTH EVERY DAY 90 tablet 3   flecainide (TAMBOCOR) 50 MG tablet TAKE 1 TABLET BY MOUTH TWICE A DAY AS NEEDED 180  tablet 3   furosemide (LASIX) 20 MG tablet TAKE 1 TABLET BY MOUTH EVERY DAY 90 tablet 3   losartan (COZAAR) 100 MG tablet Take 1 tablet (100 mg total) by mouth daily. 90 tablet 2   metFORMIN (GLUCOPHAGE) 500 MG tablet TAKE 1 TABLET BY MOUTH 2 TIMES DAILY WITH A MEAL. 180 tablet 3   metoprolol succinate (TOPROL-XL) 25 MG 24 hr tablet TAKE 1 TABLET BY MOUTH DAILY TAKE WITH OR IMMEDIATELY FOLLOWING A MEAL. MAY START WITH 1/2TAB DAILY 90 tablet 3   omeprazole (PRILOSEC) 20 MG capsule TAKE 1 CAPSULE (20 MG TOTAL) BY MOUTH 2 (TWO) TIMES DAILY BEFORE A MEAL. 180 capsule 1   POTASSIUM CHLORIDE PO Take by mouth daily.     No facility-administered medications prior to visit.   Allergies  Allergen Reactions   Statins     REACTION: Has terrible reactions; abdominal pain   Objective:   Today's Vitals   06/01/23 1007  BP: 124/68  Pulse: 63  Temp: 97.9 F (36.6 C)  TempSrc: Temporal  SpO2: 98%  Weight: 191 lb (86.6 kg)  Height: 5\' 5"  (1.651 m)   Body mass index is 31.78 kg/m.   General: Well developed, well nourished. No acute distress. Feet- Skin intact. No sign of maceration between toes. The great nails are thickened and tending to ingrowing. Dorsalis pedis and posterior tibial artery pulses are normal. 5.07 monofilament testing shows   distal numbness. Psych: Alert and oriented. Normal mood and affect.  Health Maintenance Due  Topic Date Due   FOOT EXAM  Never done   OPHTHALMOLOGY EXAM  Never done   DTaP/Tdap/Td (1 - Tdap) Never done   Medicare Annual Wellness (AWV)  05/17/2023     Assessment & Plan:   Problem List Items Addressed This Visit       Cardiovascular and Mediastinum   Essential hypertension - Primary (Chronic)   Blood pressure is in good control. Continue losartan 100 mg daily.       Atrial fibrillation (HCC)   Intermittent atrial fibrillation. Continue metoprolol succinate 25 mg 1/2 tab daily for rate control, flecainide 50 mg PRN for rhythm control, and  apixaban 5 mg bid for stroke prevention.      CAD (coronary artery disease)   Stable. No angina. Continue metoprolol succinate 25 mg 1/2 tab daily.        Endocrine   Type 2 diabetes with stage 3 chronic kidney disease GFR 30-59 (HCC)   In good control. Continue metformin 500 mg daily.      Relevant Orders   Glucose, random (Completed)   Hemoglobin A1c (Completed)     Nervous and Auditory   Idiopathic neuropathy   Existing prior to diabetes diagnosis,  so unlikely to be due to diabetes. Reviewed foot care to help prevent long-term complications.        Genitourinary   Stage 3a chronic kidney disease (CKD) (HCC)   Continue focus on blood pressure and glucose control, adequate hydration, and avoidance of nephrotoxic medications.         Other   Hyperlipidemia (Chronic)   Intolerant of statins and unable to take alirocumab. Lipids at high, but options are limited. Continue ezetimibe 10 mg daily.      Normocytic anemia   I will reassess CBC and check iron, vitamin B12 and folate levels to look for underlying issues. She is on chronic anticoagulation, but denies any GI blood loss.      Relevant Orders   CBC (Completed)   Iron, TIBC and Ferritin Panel   Vitamin B12 (Completed)   Folate (Completed)   Other Visit Diagnoses       Onychogryphosis       I will refer to podiatry.   Relevant Orders   Ambulatory referral to Podiatry       Return in about 3 months (around 09/01/2023) for Reassessment.   Loyola Mast, MD

## 2023-06-01 NOTE — Assessment & Plan Note (Signed)
 Blood pressure is in good control. Continue losartan 100 mg daily.

## 2023-06-01 NOTE — Assessment & Plan Note (Addendum)
 Stable. No angina. Continue metoprolol succinate 25 mg 1/2 tab daily.

## 2023-06-01 NOTE — Assessment & Plan Note (Signed)
 In good control. Continue metformin 500 mg daily.

## 2023-06-01 NOTE — Assessment & Plan Note (Signed)
 I will reassess CBC and check iron, vitamin B12 and folate levels to look for underlying issues. She is on chronic anticoagulation, but denies any GI blood loss.

## 2023-06-02 DIAGNOSIS — E611 Iron deficiency: Secondary | ICD-10-CM | POA: Insufficient documentation

## 2023-06-02 LAB — IRON,TIBC AND FERRITIN PANEL
%SAT: 7 % — ABNORMAL LOW (ref 16–45)
Ferritin: 8 ng/mL — ABNORMAL LOW (ref 16–288)
Iron: 31 ug/dL — ABNORMAL LOW (ref 45–160)
TIBC: 437 ug/dL (ref 250–450)

## 2023-06-02 MED ORDER — IRON (FERROUS SULFATE) 325 (65 FE) MG PO TABS: 325.0000 mg | ORAL_TABLET | Freq: Every day | ORAL | 0 refills | Status: DC

## 2023-06-02 NOTE — Addendum Note (Signed)
 Addended by: Loyola Mast on: 06/02/2023 09:15 AM   Modules accepted: Orders

## 2023-06-05 ENCOUNTER — Encounter: Payer: Self-pay | Admitting: Internal Medicine

## 2023-06-05 ENCOUNTER — Ambulatory Visit: Payer: Medicare Other | Attending: Internal Medicine | Admitting: Internal Medicine

## 2023-06-05 VITALS — BP 124/73 | HR 55 | Resp 14 | Ht 65.0 in | Wt 191.0 lb

## 2023-06-05 DIAGNOSIS — M79671 Pain in right foot: Secondary | ICD-10-CM | POA: Insufficient documentation

## 2023-06-05 DIAGNOSIS — M720 Palmar fascial fibromatosis [Dupuytren]: Secondary | ICD-10-CM | POA: Diagnosis not present

## 2023-06-05 DIAGNOSIS — Z5181 Encounter for therapeutic drug level monitoring: Secondary | ICD-10-CM | POA: Diagnosis not present

## 2023-06-05 DIAGNOSIS — M1A2790 Drug-induced chronic gout, unspecified ankle and foot, without tophus (tophi): Secondary | ICD-10-CM | POA: Insufficient documentation

## 2023-06-05 DIAGNOSIS — M65332 Trigger finger, left middle finger: Secondary | ICD-10-CM | POA: Diagnosis not present

## 2023-06-05 DIAGNOSIS — M10079 Idiopathic gout, unspecified ankle and foot: Secondary | ICD-10-CM | POA: Insufficient documentation

## 2023-06-05 MED ORDER — ALLOPURINOL 100 MG PO TABS: 100.0000 mg | ORAL_TABLET | Freq: Every day | ORAL | 1 refills | Status: DC

## 2023-06-13 ENCOUNTER — Ambulatory Visit (INDEPENDENT_AMBULATORY_CARE_PROVIDER_SITE_OTHER): Admitting: Podiatry

## 2023-06-13 ENCOUNTER — Encounter: Payer: Self-pay | Admitting: Podiatry

## 2023-06-13 DIAGNOSIS — L603 Nail dystrophy: Secondary | ICD-10-CM | POA: Diagnosis not present

## 2023-06-13 DIAGNOSIS — E1142 Type 2 diabetes mellitus with diabetic polyneuropathy: Secondary | ICD-10-CM

## 2023-06-13 NOTE — Progress Notes (Signed)
  Subjective:  Patient ID: Dawn Herring, female    DOB: January 11, 1946,  MRN: [id number]  Chief Complaint  Patient presents with   Ingrown Toenail    Bilateral ingrown toenails and thickened toenails. Newly diagnosed diabetic. A1c 6.6 2 weeks ago.    78 y.o. female presents with the above complaint. History confirmed with patient.  She has a history of neuropathy.  Diabetes is a new diagnosis for her.  Has had chronic issues with ingrowing toenails and has lost these toenails before.  Objective:  Physical Exam: warm, good capillary refill, no trophic changes or ulcerative lesions, normal DP and PT pulses, and abnormal sensory exam with diffuse polyneuropathy, she has severely thickened dystrophic hallux nails bilateral.  Assessment:   1. Nail dystrophy   2. Controlled type 2 diabetes mellitus with diabetic polyneuropathy, without long-term current use of insulin (HCC)      Plan:  Patient was evaluated and treated and all questions answered.  Patient educated on diabetes. Discussed proper diabetic foot care and discussed risks and complications of disease. Educated patient in depth on reasons to return to the office immediately should he/she discover anything concerning or new on the feet. All questions answered. Discussed proper shoes as well.   Discussed permanent correction for the severe nail dystrophy.  Discussed permanent total matricectomy.  She will consider this for the future.  Remaining nails trimmed in length and thickness today with a sharp nail nipper.  She would like to follow-up with [place] on a regular basis for at risk diabetic footcare.  See me as needed if any other issues and/or if she would like to proceed with total matricectomy  Return in about 3 months (around 09/13/2023) for at risk diabetic foot care.

## 2023-06-23 ENCOUNTER — Ambulatory Visit: Payer: Medicare Other

## 2023-06-23 VITALS — BP 110/62 | HR 66 | Temp 97.6°F | Ht 65.0 in | Wt 188.4 lb

## 2023-06-23 DIAGNOSIS — Z0000 Encounter for general adult medical examination without abnormal findings: Secondary | ICD-10-CM | POA: Diagnosis not present

## 2023-06-23 NOTE — Progress Notes (Signed)
 Subjective:   Dawn Herring is a 78 y.o. who presents for a Medicare Wellness preventive visit.  Visit Complete: In person    Persons Participating in Visit: Patient.  AWV Questionnaire: No: Patient Medicare AWV questionnaire was not completed prior to this visit.  Cardiac Risk Factors include: advanced age (>78men, >83 women);diabetes mellitus;dyslipidemia;hypertension     Objective:    Today's Vitals   06/23/23 1339  BP: 110/62  Pulse: 66  Temp: 97.6 F (36.4 C)  TempSrc: Oral  SpO2: 96%  Weight: 188 lb 6.4 oz (85.5 kg)  Height: 5\' 5"  (1.651 m)   Body mass index is 31.35 kg/m.     06/23/2023    1:47 PM 04/04/2023    4:36 PM 05/16/2022   11:38 AM 05/14/2021   11:34 AM 04/24/2020   11:47 AM 02/07/2018    2:34 PM 08/25/2017    4:31 PM  Advanced Directives  Does Patient Have a Medical Advance Directive? Yes Yes Yes No Yes Yes Yes  Type of Estate agent of [place];Living will  Healthcare Power of [place];Living will  Healthcare Power of [place];Living will Healthcare Power of [place];Living will;Out of facility DNR (pink MOST or yellow form) Healthcare Power of [place];Living will  Does patient want to make changes to medical advance directive?   No - Patient declined  No - Patient declined No - Patient declined No - Patient declined  Copy of Healthcare Power of Attorney in Chart? Yes - validated most recent copy scanned in chart (See row information)  Yes - validated most recent copy scanned in chart (See row information)  No - copy requested  No - copy requested  Would patient like information on creating a medical advance directive?    No - Patient declined       Current Medications (verified) Outpatient Encounter Medications as of 06/23/2023  Medication Sig   allopurinol (ZYLOPRIM) 100 MG tablet Take 1 tablet (100 mg total) by mouth daily.   CALCIUM-MAGNESIUM-ZINC PO Take by mouth daily.   cyanocobalamin (VITAMIN B12) 1000 MCG tablet Take  1,000 mcg by mouth daily. Patient states she takes it three times a week   ELIQUIS 5 MG TABS tablet TAKE 1 TABLET BY MOUTH TWICE A DAY   ezetimibe (ZETIA) 10 MG tablet TAKE 1 TABLET BY MOUTH EVERY DAY   flecainide (TAMBOCOR) 50 MG tablet TAKE 1 TABLET BY MOUTH TWICE A DAY AS NEEDED   furosemide (LASIX) 20 MG tablet TAKE 1 TABLET BY MOUTH EVERY DAY   Iron, Ferrous Sulfate, 325 (65 Fe) MG TABS Take 325 mg by mouth daily.   losartan (COZAAR) 100 MG tablet Take 1 tablet (100 mg total) by mouth daily.   metFORMIN (GLUCOPHAGE) 500 MG tablet TAKE 1 TABLET BY MOUTH 2 TIMES DAILY WITH A MEAL.   metoprolol succinate (TOPROL-XL) 25 MG 24 hr tablet TAKE 1 TABLET BY MOUTH DAILY TAKE WITH OR IMMEDIATELY FOLLOWING A MEAL. MAY START WITH 1/2TAB DAILY   omeprazole (PRILOSEC) 20 MG capsule TAKE 1 CAPSULE (20 MG TOTAL) BY MOUTH 2 (TWO) TIMES DAILY BEFORE A MEAL.   POTASSIUM CHLORIDE PO Take by mouth daily.   No facility-administered encounter medications on file as of 06/23/2023.    Allergies (verified) Statins   History: Past Medical History:  Diagnosis Date   Allergy    Anemia    Cataract 2017   Chest pain    a. Neg stress tests - 2005, 2008, 2009;  b. 08/2014 Cardiac CT: Ca score  193 (80th%'ile).   Diabetes mellitus without complication (HCC)    Essential hypertension    GERD (gastroesophageal reflux disease)    Hiatal hernia    a. 08/2014 CT chest: large hiatal hernia.   History of DVT (deep vein thrombosis)    Hyperlipidemia    a. statin intolerant;  b. on zetia and repatha.   Palpitations    a. 02/2015 Event Monitor: NSR w/ rare, short episodes of A Tach, rare short runs of SVT < 15 beats.   Type 2 diabetes mellitus (HCC) 11/2022   Past Surgical History:  Procedure Laterality Date   ABDOMINAL EXPLORATION SURGERY     APPENDECTOMY  2017   CATARACT EXTRACTION, BILATERAL     EYE SURGERY  2019   LAPAROSCOPIC APPENDECTOMY N/A 12/02/2015   Procedure: APPENDECTOMY LAPAROSCOPIC;  Surgeon: Berna Bue, MD;  Location: [facility] OR;  Service: General;  Laterality: N/A;   ROOT CANAL  2013   ROOT CANAL     VAGINAL HYSTERECTOMY     abdominal hysterectomy   Family History  Problem Relation Age of Onset   Heart disease Mother    Hyperlipidemia Mother    Stroke Father    Heart disease Father    Hyperlipidemia Sister    Hypothyroidism Sister    Cancer Brother        Thyroid   Heart disease Brother    Stroke Brother    Diabetes Maternal Aunt    Cancer Paternal Grandmother        Colon   Colon cancer Other        Grandparent   Social History   Socioeconomic History   Marital status: Married    Spouse name: Not on file   Number of children: Not on file   Years of education: Not on file   Highest education level: Bachelor's degree (e.g., BA, AB, BS)  Occupational History   Not on file  Tobacco Use   Smoking status: Never    Passive exposure: Never   Smokeless tobacco: Never  Vaping Use   Vaping status: Never Used  Substance and Sexual Activity   Alcohol use: No   Drug use: No   Sexual activity: Yes  Other Topics Concern   Not on file  Social History Narrative   Not on file   Social Drivers of Health   Financial Resource Strain: Low Risk  (06/23/2023)   Overall Financial Resource Strain (CARDIA)    Difficulty of Paying Living Expenses: Not hard at all  Food Insecurity: No Food Insecurity (06/23/2023)   Hunger Vital Sign    Worried About Running Out of Food in the Last Year: Never true    Ran Out of Food in the Last Year: Never true  Transportation Needs: No Transportation Needs (06/23/2023)   PRAPARE - Administrator, Civil Service (Medical): No    Lack of Transportation (Non-Medical): No  Physical Activity: Inactive (06/23/2023)   Exercise Vital Sign    Days of Exercise per Week: 0 days    Minutes of Exercise per Session: 0 min  Stress: No Stress Concern Present (06/23/2023)   [employer] of Occupational Health - Occupational Stress Questionnaire     Feeling of Stress : Not at all  Social Connections: Socially Integrated (06/23/2023)   Social Connection and Isolation Panel [NHANES]    Frequency of Communication with Friends and Family: More than three times a week    Frequency of Social Gatherings with Friends and Family: More  than three times a week    Attends Religious Services: More than 4 times per year    Active Member of Clubs or Organizations: Yes    Attends Engineer, structural: More than 4 times per year    Marital Status: Married    Tobacco Counseling Counseling given: Not Answered    Clinical Intake:  Pre-visit preparation completed: Yes  Pain : No/denies pain     Nutritional Status: BMI > 30  Obese Nutritional Risks: None Diabetes: Yes CBG done?: No Did pt. bring in CBG monitor from home?: No  Lab Results  Component Value Date   HGBA1C 6.6 (H) 06/01/2023   HGBA1C 6.6 (H) 03/03/2023   HGBA1C 6.7 (H) 12/12/2022     How often do you need to have someone help you when you read instructions, pamphlets, or other written materials from your doctor or pharmacy?: 1 - Never  Interpreter Needed?: No  Information entered by :: NAllen LPN   Activities of Daily Living     06/23/2023    1:40 PM  In your present state of health, do you have any difficulty performing the following activities:  Hearing? 1  Comment has hearing aids  Vision? 1  Comment left eye is weak  Difficulty concentrating or making decisions? 0  Walking or climbing stairs? 1  Dressing or bathing? 0  Doing errands, shopping? 0  Preparing Food and eating ? N  Using the Toilet? N  In the past six months, have you accidently leaked urine? N  Do you have problems with loss of bowel control? N  Managing your Medications? N  Managing your Finances? N  Housekeeping or managing your Housekeeping? N    Patient Care Team: Loyola Mast, MD as PCP - General (Family Medicine) Mariah Milling Tollie Pizza, MD as PCP - Cardiology  (Cardiology) Antonieta Iba, MD as Consulting Physician (Cardiology) Fuller Plan, MD as Consulting Physician (Rheumatology) Charna Elizabeth, MD as Consulting Physician (Gastroenterology)  Indicate any recent Medical Services you may have received from other than [facility] providers in the past year (date may be approximate).     Assessment:   This is a routine wellness examination for Dawn Herring.  Hearing/Vision screen Hearing Screening - Comments:: Has hearing aids that are maintained Vision Screening - Comments:: Regular eye exams, Dr. Charlotte Sanes   Goals Addressed             This Visit's Progress    Patient Stated       06/23/2023, wants to lose weight       Depression Screen     06/23/2023    1:48 PM 04/04/2023    4:36 PM 03/03/2023    9:57 AM 12/12/2022   10:43 AM 06/10/2022   10:26 AM 05/16/2022   11:40 AM 03/11/2022    4:49 PM  PHQ 2/9 Scores  PHQ - 2 Score 0 0 0 0 0 0 0  PHQ- 9 Score 4   3 2       Fall Risk     06/23/2023    1:48 PM 04/04/2023    4:36 PM 03/03/2023    9:57 AM 12/12/2022   10:43 AM 06/10/2022   10:26 AM  Fall Risk   Falls in the past year? 0 0 0 0 0  Number falls in past yr: 0  0 0 0  Injury with Fall? 0  0 0 0  Risk for fall due to : Medication side effect  No Fall Risks No Fall  Risks No Fall Risks  Follow up Falls prevention discussed;Falls evaluation completed  Falls evaluation completed Falls evaluation completed Falls evaluation completed    MEDICARE RISK AT HOME:  Medicare Risk at Home Any stairs in or around the home?: Yes If so, are there any without handrails?: No Home free of loose throw rugs in walkways, pet beds, electrical cords, etc?: Yes Adequate lighting in your home to reduce risk of falls?: Yes Life alert?: No Use of a cane, walker or w/c?: No Grab bars in the bathroom?: Yes Shower chair or bench in shower?: Yes Elevated toilet seat or a handicapped toilet?: Yes  TIMED UP AND GO:  Was the test performed?  No  Cognitive  Function: 6CIT completed        06/23/2023    1:54 PM 05/16/2022   11:40 AM 08/25/2017    4:47 PM 08/24/2016    4:27 PM  6CIT Screen  What Year? 0 points 0 points 0 points 0 points  What month? 0 points 0 points 0 points 0 points  What time? 0 points 0 points 0 points 0 points  Count back from 20 0 points 0 points 0 points 0 points  Months in reverse 0 points 0 points 0 points 0 points  Repeat phrase 2 points 0 points 0 points 0 points  Total Score 2 points 0 points 0 points 0 points    Immunizations Immunization History  Administered Date(s) Administered   Fluad Quad(high Dose 65+) 04/08/2019, 04/20/2020, 02/17/2021, 12/10/2021   Fluad Trivalent(High Dose 65+) 03/03/2023   Hepatitis B, ADULT 03/17/2015, 04/21/2015, 09/15/2015   Influenza, High Dose Seasonal PF 01/13/2017, 01/17/2018   Influenza,inj,Quad PF,6+ Mos 03/03/2015, 02/17/2016   Influenza-Unspecified 12/10/2021   Moderna Sars-Covid-2 Vaccination 05/09/2019, 06/03/2019, 01/24/2020, 07/13/2020   Pneumococcal Conjugate-13 07/14/2016   Pneumococcal Polysaccharide-23 04/29/2020   Zoster Recombinant(Shingrix) 06/03/2021, 11/08/2021   Zoster, Live 11/13/2012    Screening Tests Health Maintenance  Topic Date Due   DTaP/Tdap/Td (1 - Tdap) Never done   COVID-19 Vaccine (5 - 2024-25 season) 11/27/2022   OPHTHALMOLOGY EXAM  06/28/2023   HEMOGLOBIN A1C  12/02/2023   Diabetic kidney evaluation - eGFR measurement  03/02/2024   Diabetic kidney evaluation - Urine ACR  03/02/2024   FOOT EXAM  05/31/2024   Medicare Annual Wellness (AWV)  06/22/2024   Pneumonia Vaccine 4+ Years old  Completed   INFLUENZA VACCINE  Completed   DEXA SCAN  Completed   Hepatitis C Screening  Completed   Zoster Vaccines- Shingrix  Completed   HPV VACCINES  Aged Out   Fecal DNA (Cologuard)  Discontinued    Health Maintenance  Health Maintenance Due  Topic Date Due   DTaP/Tdap/Td (1 - Tdap) Never done   COVID-19 Vaccine (5 - 2024-25 season)  11/27/2022   Health Maintenance Items Addressed: Due for TDAP and covid vaccine.  Additional Screening:  Vision Screening: Recommended annual ophthalmology exams for early detection of glaucoma and other disorders of the eye.  Dental Screening: Recommended annual dental exams for proper oral hygiene  Community Resource Referral / Chronic Care Management: CRR required this visit?  No   CCM required this visit?  No     Plan:     I have personally reviewed and noted the following in the patient's chart:   Medical and social history Use of alcohol, tobacco or illicit drugs  Current medications and supplements including opioid prescriptions. Patient is not currently taking opioid prescriptions. Functional ability and status Nutritional status Physical activity  Advanced directives List of other physicians Hospitalizations, surgeries, and ER visits in previous 12 months Vitals Screenings to include cognitive, depression, and falls Referrals and appointments  In addition, I have reviewed and discussed with patient certain preventive protocols, quality metrics, and best practice recommendations. A written personalized care plan for preventive services as well as general preventive health recommendations were provided to patient.     Barb Merino, LPN   [id number]/[id number]   After Visit Summary: (In Person-Printed) AVS printed and given to the patient  Notes: Nothing significant to report at this time.

## 2023-06-23 NOTE — Patient Instructions (Signed)
 Ms. Barillas , Thank you for taking time to come for your Medicare Wellness Visit. I appreciate your ongoing commitment to your health goals. Please review the following plan we discussed and let me know if I can assist you in the future.   Referrals/Orders/Follow-Ups/Clinician Recommendations: none  This is a list of the screening recommended for you and due dates:  Health Maintenance  Topic Date Due   DTaP/Tdap/Td vaccine (1 - Tdap) Never done   COVID-19 Vaccine (5 - 2024-25 season) 11/27/2022   Eye exam for diabetics  06/28/2023   Hemoglobin A1C  12/02/2023   Yearly kidney function blood test for diabetes  03/02/2024   Yearly kidney health urinalysis for diabetes  03/02/2024   Complete foot exam   05/31/2024   Medicare Annual Wellness Visit  06/22/2024   Pneumonia Vaccine  Completed   Flu Shot  Completed   DEXA scan (bone density measurement)  Completed   Hepatitis C Screening  Completed   Zoster (Shingles) Vaccine  Completed   HPV Vaccine  Aged Out   Cologuard (Stool DNA test)  Discontinued    Advanced directives: (In Chart) A copy of your advanced directives are scanned into your chart should your provider ever need it.  Next Medicare Annual Wellness Visit scheduled for next year: Yes  insert Preventive Care attachment Insert FALL PREVENTION attachment if needed

## 2023-07-05 DIAGNOSIS — E119 Type 2 diabetes mellitus without complications: Secondary | ICD-10-CM | POA: Diagnosis not present

## 2023-07-05 DIAGNOSIS — H5213 Myopia, bilateral: Secondary | ICD-10-CM | POA: Diagnosis not present

## 2023-07-05 DIAGNOSIS — Z961 Presence of intraocular lens: Secondary | ICD-10-CM | POA: Diagnosis not present

## 2023-07-05 LAB — HM DIABETES EYE EXAM

## 2023-07-09 NOTE — Progress Notes (Unsigned)
 Cardiology Clinic Note   Date: 07/13/2023 ID: Dawn Herring, DOB [birth year], MRN [id number]  Primary Cardiologist:  Belva Boyden, MD  Chief Complaint   Dawn Herring is a 78 y.o. female who presents to the clinic today for routine follow up.   Patient Profile   Dawn Herring is followed by Dr. Gollan for the history outlined below.      Past medical history significant for: PAF. 14-day ZIO 05/11/2020: HR 43 to 177 bpm, average 57 bpm.  A-fib occurred <1% with HR 84 to 177 bpm, average 128 bpm, longest lasting 1 hour 52 minutes with average rate 128 bpm.  A-fib detected within +/- 45 seconds of symptomatic patient events rare ectopy. Atrial tachycardia. 30-day event monitor 03/16/2015: NSR with rare short episodes of atrial tachycardia, rare short runs of SVT <15 beats. Hypertension. Hyperlipidemia. CT cardiac scoring 08/29/2014: Coronary calcium score of 193 (80th percentile). Lipid panel 03/03/2023: LDL 153, HDL 47, TG 290, total 258. T2DM. CKD stage IIIa. DVT.  In summary, patient was previously followed by [facility] heart and vascular.  She established care with Dr. Gollan on 04/14/2010.  She has a remote history of normal low risk Myoview and echo demonstrating EF of 60% with no significant valvular abnormalities in April 2005.  She has a history of statin intolerance in the setting of familial hypercholesterolemia a PCSK9i.  She underwent CT cardiac scoring with a calcium score of 193.  At follow-up visit late October 2016 she reported increased palpitations/tachycardia.  She wore a 30-day event monitor which showed short episodes of A. tach and rare SVT as detailed above.  During office follow-up in early January 2022 she reported more episodes of tachycardia with increased use of propranolol and flecainide.  Palpitations seem to occur at night with rates of 150 bpm.  Patient wore a 14-day ZIO which showed symptomatic A-fib as detailed above.   Seen in the office by Dr. Gollan  on 05/11/2022 for routine follow-up. She complained of elevated uric acid levels and rash on arms and behind knees. She had stopped Vascepa and Praluent secondary to concern it was contributing to gout and rash. Retrial of Praluent was recommended if LDL elevated at recheck with PCP in March. No medication changes made.   Patient was last seen in the office by me on 04/14/2023 for evaluation of increased episodes of A-fib.  Patient reported increased episodes of A-fib starting in December typically occurring at night waking her from sleep.  She denied palpitations but felt tightness between her shoulder blades when she felt she was having an A-fib episode.  She took flecainide x 2 on Christmas Day.  She was monitoring her rhythm closely with her Apple Watch.  She reported episodes for decreasing at outpatient monitoring was deferred.     History of Present Illness    Today, patient reports a recent URI. She had 2 days in a row of afib then skipped a day and had another day of afib. She knows she is in afib when she feels tightness between her shoulder blades and her HR is elevated. She then checks her rhythm with her Apple watch. Episodes resolve within 60-90 minutes after dose of flecainide. She feels she is having more episodes of afib since January. She is also concerned that her HR is dipping into the 40s. She takes 1/2 dose of Toprol at night. She denies lightheadedness or dizziness but will feel somewhat fatigued when HR is low. HR does increase with activity.  She denies blood in stool or urine. No chest pain, shortness of breath, orthopnea or PND. Chronic lower extremity edema is managed with Lasix and compression.     ROS: All other systems reviewed and are otherwise negative except as noted in History of Present Illness.  EKGs/Labs Reviewed        03/03/2023: ALT 15; AST 21; BUN 21; Creatinine, Ser 0.96; Potassium 3.9; Sodium 139   06/01/2023: Hemoglobin 11.0; WBC 6.0   12/12/2022: TSH 2.05     Risk Assessment/Calculations     CHA2DS2-VASc Score = 5   This indicates a 7.2% annual risk of stroke. The patient's score is based upon: CHF History: 0 HTN History: 1 Diabetes History: 1 Stroke History: 0 Vascular Disease History: 0 Age Score: 2 Gender Score: 1             Physical Exam    VS:  BP 130/76 (BP Location: Left Arm, Patient Position: Sitting, Cuff Size: Normal)   Pulse (!) 52   Ht 5\' 5"  (1.651 m)   Wt 189 lb (85.7 kg)   SpO2 97%   BMI 31.45 kg/m  , BMI Body mass index is 31.45 kg/m.  GEN: Well nourished, well developed, in no acute distress. Neck: No JVD or carotid bruits. Cardiac:  RRR. No murmurs. No rubs or gallops.   Respiratory:  Respirations regular and unlabored. Clear to auscultation without rales, wheezing or rhonchi. GI: Soft, nontender, nondistended. Extremities: Radials/DP/PT 2+ and equal bilaterally. No clubbing or cyanosis. Mild nonpitting edema bilateral lower extremities. Compression socks in place.   Skin: Warm and dry, no rash. Neuro: Strength intact.  Assessment & Plan   PAF 14-day ZIO February 2022 showed HR 43 to 177 bpm, average 57 bpm, <1% A-fib burden.  A-fib detected within +/- 45 seconds of symptomatic patient events.  Patient reports increased episodes of afib since January. She had a recent URI and noted afib 2 days in a row, skipped a day and then had another day of afib. She denies not have palpitations but feels tightness between shoulder blades and heart rate is elevated when she is in afib. She checks her rhythm with her Apple watch. She has been noting a drop in her HR into the 40s. HR does increase with activity. Afib episodes resolve within 60-90 minutes of taking flecainide.  -Continue as needed flecainide, Eliquis. Appropriate Eliquis dose. -Change Toprol 1/2 tablet to AM dosing to see if it is better tolerated. If HR continues to drop she is to stop it and take it as needed.  -2 week Zio for further evaluation of  heart rate and afib burden.  -Refer to EP.    Hypertension BP today 140/70 on intake and 130/76 on my recheck. No dizziness or headaches reported.  -Continue Toprol, losartan.    Chronic lower extremity edema Patient with history of chronic lower extremity edema that is unchanged. It is managed with Lasix and compression. Edema is gone in the mornings and progresses throughout the day. Mild, nonpitting edema bilateral ankles. Compression socks in place.  -Continue Lasix and compression socks.   Hyperlipidemia/statin intolerance CT cardiac scoring June 2016 showed calcium score of 193.  LDL December 2024 153, not at goal.  Patient has a history of statin intolerance.  She developed rash on Vascepa and Praluent and was not interested in Praluent retrial.   -Continue Zetia.  Disposition: Take Toprol in the AM.  2 week Zio. EP referral. Return in 6 months or sooner as needed.  Signed, Lonell Rives. Nova Evett, DNP, NP-C

## 2023-07-10 ENCOUNTER — Encounter: Payer: Self-pay | Admitting: Dietician

## 2023-07-10 ENCOUNTER — Encounter: Payer: Medicare Other | Attending: Family Medicine | Admitting: Dietician

## 2023-07-10 DIAGNOSIS — E1122 Type 2 diabetes mellitus with diabetic chronic kidney disease: Secondary | ICD-10-CM | POA: Diagnosis not present

## 2023-07-10 DIAGNOSIS — N183 Chronic kidney disease, stage 3 unspecified: Secondary | ICD-10-CM | POA: Diagnosis not present

## 2023-07-10 NOTE — Progress Notes (Signed)
 Appointment start:  1411 Appointment End:  1445  Patient attended Diabetes Core Classes 1-3 between 03/30/2023 and 04/13/2023 at [facility]. The purpose of the meeting today is to review information learned during those classes as well as review patient application and goals.   What are one or two positive things that you are doing right now to manage your diabetes?  Stopped regular soda and cook out milkshakes She is drinking more water and unsweetened tea.  What is the hardest part about your diabetes right now, causing you the most concern, or is the most worrisome to you about your diabetes?  She states that she dislikes the spots on her skin. She has had an eye exam, dentist, podiatrist.  What questions do you have today?  none  Have you participated in any diabetes support group?  None - She gets an update daily from [employer] diabetic.  History:  Type 2 Diabetes, Afib A1C:  6.6% 06/01/2023 and 03/03/2023 Medications include:  Metformin, magnesium, potassium, iron, calcium, Vitamin B-12 Sleep:  poor Weight:  188 lbs 07/10/2023 decreased from 202 lbs 11/2022 Blood Glucose:  not testing  Social History:  Patient lives with her husband.  She is doing the shopping and cooking. Exercise:  ADL's due to heart rhythm - she is walking some (2-4 times per week for 30-45 minutes)  24 hour diet recall: Doesn't like much meat. Breakfast 10 am:  rice creamers, strawberries, fat free half and half OR oatmeal (plain) OR egg, toast, fruit Or breakfast casserole, fruit Snack:  none Lunch  2-3 pm:  pimento cheese on [employer] keto bread, strawberries OR salad, egg, cheese, ham, [place] Snack:  occasional NABS OR apple Dinner 6:30-7 pm:  cheese, carrots, cucumbers, occasional ranch (lite) OR pizza last night (rare) Snack: Beverages:  water, unsweetened tea, rare OJ, cranberry zero  Specific focus but not limited to the following: Hgb A1c goal  Review of carbohydrate counting,  importance of regularly scheduled meals/snacks, and meal planning to improve quality of diet. Review of the effects of physical activity on glucose levels and long-term glucose control.  Recommended goal of 150 minutes of physical activity/week. Review of patient medications and discussed role of medication on blood glucose  Review of prevention, detection, and treatment of long-term complications.  Discussion of the role of prolonged elevated glucose levels on body systems. - she is consistent and up to date with all diabetes related appointments (eye, teeth, feet)  Continuing Goals: Continue mindful eating Consider the balance of the plate to obtain balanced nutrition Choose a protein with each meal and snack (egg, cheese, nuts, peanut butter, beans, meat, fish). Consider changing keto bread to whole wheat bread due to the nutrient profile (B-vitamins).  Future Follow up:  prn

## 2023-07-13 ENCOUNTER — Ambulatory Visit

## 2023-07-13 ENCOUNTER — Encounter: Payer: Self-pay | Admitting: Student

## 2023-07-13 ENCOUNTER — Ambulatory Visit: Payer: Medicare Other | Attending: Student | Admitting: Student

## 2023-07-13 VITALS — BP 130/76 | HR 52 | Ht 65.0 in | Wt 189.0 lb

## 2023-07-13 DIAGNOSIS — I10 Essential (primary) hypertension: Secondary | ICD-10-CM | POA: Insufficient documentation

## 2023-07-13 DIAGNOSIS — E7800 Pure hypercholesterolemia, unspecified: Secondary | ICD-10-CM | POA: Insufficient documentation

## 2023-07-13 DIAGNOSIS — I480 Paroxysmal atrial fibrillation: Secondary | ICD-10-CM

## 2023-07-13 DIAGNOSIS — R600 Localized edema: Secondary | ICD-10-CM | POA: Insufficient documentation

## 2023-07-13 MED ORDER — METOPROLOL SUCCINATE ER 25 MG PO TB24: ORAL_TABLET | ORAL | Status: DC

## 2023-07-13 NOTE — Patient Instructions (Addendum)
 Medication Instructions:  Your physician has recommended you make the following change in your medication:   -Change metoprolol succinate (Toprol- XL) to take in the morning with a meal.   *If you need a refill on your cardiac medications before your next appointment, please call your pharmacy*   Testing/Procedures: ZIO XT- Long Term Monitor Instructions  Your physician has requested you wear a ZIO patch monitor for 14 days.  This is a single patch monitor. [employer] supplies one patch monitor per enrollment. Additional stickers are not available. Please do not apply patch if you will be having a Nuclear Stress Test,  Echocardiogram, Cardiac CT, MRI, or Chest Xray during the period you would be wearing the  monitor. The patch cannot be worn during these tests. You cannot remove and re-apply the  ZIO XT patch monitor.  Your ZIO patch monitor will be mailed 3 day USPS to your address on file. It may take 3-5 days  to receive your monitor after you have been enrolled.  Once you have received your monitor, please review the enclosed instructions. Your monitor  has already been registered assigning a specific monitor serial # to you.  Billing and Patient Assistance Program Information  We have supplied [employer] with any of your insurance information on file for billing purposes. [employer] offers a sliding scale Patient Assistance Program for patients that do not have  insurance, or whose insurance does not completely cover the cost of the ZIO monitor.  You must apply for the Patient Assistance Program to qualify for this discounted rate.  To apply, please call [employer] at [phone], select option 4, select option 2, ask to apply for  Patient Assistance Program. Meredeth Ide will ask your household income, and how many people  are in your household. They will quote your out-of-pocket cost based on that information.  [employer] will also be able to set up a 2-month, interest-free payment plan if  needed.  Applying the monitor   Shave hair from upper left chest.  Hold abrader disc by orange tab. Rub abrader in 40 strokes over the upper left chest as  indicated in your monitor instructions.  Clean area with 4 enclosed alcohol pads. Let dry.  Apply patch as indicated in monitor instructions. Patch will be placed under collarbone on left  side of chest with arrow pointing upward.  Rub patch adhesive wings for 2 minutes. Remove white label marked "1". Remove the white  label marked "2". Rub patch adhesive wings for 2 additional minutes.  While looking in a mirror, press and release button in center of patch. A small green light will  flash 3-4 times. This will be your only indicator that the monitor has been turned on.  Do not shower for the first 24 hours. You may shower after the first 24 hours.  Press the button if you feel a symptom. You will hear a small click. Record Date, Time and  Symptom in the Patient Logbook.  When you are ready to remove the patch, follow instructions on the last 2 pages of Patient  Logbook. Stick patch monitor onto the last page of Patient Logbook.  Place Patient Logbook in the blue and white box. Use locking tab on box and tape box closed  securely. The blue and white box has prepaid postage on it. Please place it in the mailbox as  soon as possible. Your physician should have your test results approximately 7 days after the  monitor has been mailed back to [facility].  Call [facility] Customer Care at [phone] if you have questions regarding  your ZIO XT patch monitor. Call them immediately if you see an orange light blinking on your  monitor.  If your monitor falls off in less than 4 days, contact our Monitor department at [phone].  If your monitor becomes loose or falls off after 4 days call [employer] at [phone] for  suggestions on securing your monitor   Follow-Up: At [facility], you and your health needs are our  priority.  As part of our continuing mission to provide you with exceptional heart care, our providers are all part of one team.  This team includes your primary Cardiologist (physician) and Advanced Practice Providers or APPs (Physician Assistants and Nurse Practitioners) who all work together to provide you with the care you need, when you need it.  Your next appointment:   6 month(s)  Provider:   Timothy Gollan, MD or Morey Ar, NP    We recommend signing up for the patient portal called "MyChart".  Sign up information is provided on this After Visit Summary.  MyChart is used to connect with patients for Virtual Visits (Telemedicine).  Patients are able to view lab/test results, encounter notes, upcoming appointments, etc.  Non-urgent messages can be sent to your provider as well.   To learn more about what you can do with MyChart, go to [email or web address].   Other Instructions    1st Floor: - Lobby - Registration  - Pharmacy  - Lab - Cafe  2nd Floor: - PV Lab - Diagnostic Testing (echo, CT, nuclear med)  3rd Floor: - Vacant  4th Floor: - TCTS (cardiothoracic surgery) - AFib Clinic - Structural Heart Clinic - Vascular Surgery  - Vascular Ultrasound  5th Floor: - [facility] (general and EP) - Clinical Pharmacy for coumadin, hypertension, lipid, weight-loss medications, and med management appointments    Valet parking services will be available as well.

## 2023-08-04 DIAGNOSIS — I480 Paroxysmal atrial fibrillation: Secondary | ICD-10-CM | POA: Diagnosis not present

## 2023-08-08 DIAGNOSIS — I480 Paroxysmal atrial fibrillation: Secondary | ICD-10-CM | POA: Diagnosis not present

## 2023-08-09 ENCOUNTER — Ambulatory Visit: Payer: Self-pay | Admitting: Student

## 2023-08-09 DIAGNOSIS — H903 Sensorineural hearing loss, bilateral: Secondary | ICD-10-CM | POA: Diagnosis not present

## 2023-08-21 NOTE — Progress Notes (Signed)
 " Electrophysiology Office Note:   Date:  08/22/2023  ID:  Dawn Herring, DOB [birth year], MRN [id number]  Primary Cardiologist: Evalene Lunger, MD Electrophysiologist: Fonda Kitty, MD      History of Present Illness:   Dawn Herring is a 78 y.o. female with h/o paroxysmal atrial fibrillation, hypertension, hyperlipidemia, type 2 diabetes, CKD stage IIIa who is being seen today for evaluation for catheter ablation.  Patient reported increased episodes of A-fib starting in December typically occurring at night waking her from sleep. She denied palpitations but felt tightness between her shoulder blades when she felt she was having an A-fib episode. She took flecainide  x 2 on Christmas Day. She was monitoring her rhythm closely with her Apple Watch.  Episodes resolve within 60-90 minutes after dose of flecainide . She feels she is having more episodes of afib since January.   Discussed the use of AI scribe software for clinical note transcription with the patient, who gave verbal consent to proceed.  History of Present Illness Dawn Herring is a 79 year old female with atrial fibrillation who presents with increased frequency of episodes and low heart rate.  She has a history of atrial fibrillation diagnosed approximately three years ago, initially confirmed by a Zio. She manages her condition with flecainide  on an as-needed basis, typically when episodes wake her from sleep. Recently, she experienced a significant drop in her heart rate, with her pulse falling into the forties during the daytime, as confirmed by her watch. This low heart rate persisted for several days, leading to severe fatigue.  In response to the low heart rate, her metoprolol  dosage was adjusted from 25 mg taken at supper to half a dose taken in the morning since July 13, 2023. Despite this adjustment, her heart rate remains low, typically in the forties, with occasional increases to 77, and a recent reading of 122 while  walking in the rain.  Her atrial fibrillation episodes occur approximately twice a month, lasting about two hours each, although they can be longer during times of stress or illness, such as a respiratory infection. She notes that her episodes are more frequent and sometimes require taking flecainide  twice in one day, as happened on Christmas Day when she had a prolonged episode due to stress from hosting a large gathering.    Review of systems complete and found to be negative unless listed in HPI.   EP Information / Studies Reviewed:    EKG is ordered today. Personal review as below.  EKG Interpretation Date/Time:  Tuesday Aug 22 2023 10:20:38 EDT Ventricular Rate:  58 PR Interval:  194 QRS Duration:  76 QT Interval:  444 QTC Calculation: 435 R Axis:   0  Text Interpretation: Sinus bradycardia Low voltage QRS T wave abnormality, consider lateral ischemia When compared with ECG of 14-Apr-2023 10:56, Nonspecific T wave abnormality has replaced inverted T waves in Anterior leads Confirmed by Kitty Fonda [phone]) on 08/22/2023 10:50:11 AM   Zio 07/2023:    Zio 04/2020:    Risk Assessment/Calculations:    CHA2DS2-VASc Score = 5   This indicates a 7.2% annual risk of stroke. The patient's score is based upon: CHF History: 0 HTN History: 1 Diabetes History: 1 Stroke History: 0 Vascular Disease History: 0 Age Score: 2 Gender Score: 1             Physical Exam:   VS:  BP 126/72   Pulse (!) 58   Ht 5' 5 (1.651 m)  Wt 185 lb (83.9 kg)   SpO2 97%   BMI 30.79 kg/m    Wt Readings from Last 3 Encounters:  08/22/23 185 lb (83.9 kg)  07/13/23 189 lb (85.7 kg)  06/23/23 188 lb 6.4 oz (85.5 kg)     GEN: Well nourished, well developed in no acute distress NECK: No JVD; No carotid bruits CARDIAC: Normal rate, regular rhythm. RESPIRATORY:  Clear to auscultation without rales, wheezing or rhonchi  ABDOMEN: Soft, non-tender, non-distended EXTREMITIES:  No edema; No  deformity   ASSESSMENT AND PLAN:    #. Paroxysmal atrial fibrillation, symptomatic: Increasing burden since December 2024. #. Secondary hypercoagulable state due to atrial fibrillation:  #. High risk medication use: Flecainide .  PR 194 ms, QRS 76 ms. #. Sinus bradycardia: Asymptomatic, but limits antiarrhythmic and rate controlling options. - Discussed treatment options today for AF including changing antiarrhythmic drug therapy and ablation.  We discussed making her flecainide  scheduled dosing rather than as needed.  Given her resting bradycardia, we also discussed transitioning to dofetilide; all other antiarrhythmic options would likely result in some slowing of heart rates.  Discussed risks, recovery and likelihood of success with each treatment strategy. Risk, benefits, and alternatives to EP study and ablation for afib were discussed. These risks include but are not limited to stroke, bleeding, vascular damage, tamponade, perforation, damage to the esophagus, lungs, phrenic nerve and other structures, pulmonary vein stenosis, worsening renal function, coronary vasospasm and death.  Discussed potential need for repeat ablation procedures and antiarrhythmic drugs after an initial ablation. The patient understands these risk and wishes to proceed.  We will therefore proceed with catheter ablation at the next available time.  Carto, ICE, anesthesia are requested for the procedure.  Will also obtain CT PV protocol prior to the procedure to exclude LAA thrombus and further evaluate atrial anatomy. - Reviewed Apple Watch tracings which were consistent with numerous episodes of AF.  - Continue flecainide  and reduced dose metoprolol  until ablation.  - Continue Eliquis  5 mg twice daily.  #. Hypertension -At goal today.  Recommend checking blood pressures 1-2 times per week at home and recording the values.  Recommend bringing these recordings to the primary care physician.  Follow up with Dr. Kennyth 3  months after ablation.   Signed, Fonda Kennyth, MD  "

## 2023-08-22 ENCOUNTER — Other Ambulatory Visit: Payer: Self-pay

## 2023-08-22 ENCOUNTER — Ambulatory Visit: Attending: Cardiology | Admitting: Cardiology

## 2023-08-22 ENCOUNTER — Encounter: Payer: Self-pay | Admitting: Cardiology

## 2023-08-22 VITALS — BP 126/72 | HR 58 | Ht 65.0 in | Wt 185.0 lb

## 2023-08-22 DIAGNOSIS — I10 Essential (primary) hypertension: Secondary | ICD-10-CM | POA: Insufficient documentation

## 2023-08-22 DIAGNOSIS — I480 Paroxysmal atrial fibrillation: Secondary | ICD-10-CM | POA: Insufficient documentation

## 2023-08-22 DIAGNOSIS — D6869 Other thrombophilia: Secondary | ICD-10-CM | POA: Insufficient documentation

## 2023-08-22 DIAGNOSIS — Z79899 Other long term (current) drug therapy: Secondary | ICD-10-CM | POA: Insufficient documentation

## 2023-08-22 NOTE — Patient Instructions (Addendum)
 Medication Instructions:  Your physician recommends that you continue on your current medications as directed. Please refer to the Current Medication list given to you today.  *If you need a refill on your cardiac medications before your next appointment, please call your pharmacy*  Lab Work: BMET and CBC - you come to the [facility] office or any [facility] location to have these drawn the week of June 9th  Testing/Procedures: Cardiac CT Your physician has requested that you have cardiac CT. Cardiac computed tomography (CT) is a painless test that uses an x-ray machine to take clear, detailed pictures of your heart. For further information please visit [email or web address]  We will call you to schedule your CT scan. It will be done about three weeks prior to your ablation.  Ablation Your physician has recommended that you have an ablation. Catheter ablation is a medical procedure used to treat some cardiac arrhythmias (irregular heartbeats). During catheter ablation, a long, thin, flexible tube is put into a blood vessel in your groin (upper thigh), or neck. This tube is called an ablation catheter. It is then guided to your heart through the blood vessel. Radio frequency waves destroy small areas of heart tissue where abnormal heartbeats may cause an arrhythmia to start. You are scheduled for Atrial Fibrillation Ablation on Tuesday, July 8 with Dr. Clinton Danas.Please arrive at the Main Entrance A at [facility]: [address] [place], [place] [address] at 5:30 AM    Follow-Up: At [facility], you and your health needs are our priority.  As part of our continuing mission to provide you with exceptional heart care, we have created designated Provider Care Teams.  These Care Teams include your primary Cardiologist (physician) and Advanced Practice Providers (APPs -  Physician Assistants and Nurse Practitioners) who all work together to provide you with the care you need, when you need  it.   Your next appointment:   We will contact you about your post-procedure follow up appointments.

## 2023-08-28 ENCOUNTER — Other Ambulatory Visit: Payer: Self-pay | Admitting: Family Medicine

## 2023-08-28 DIAGNOSIS — E611 Iron deficiency: Secondary | ICD-10-CM

## 2023-09-01 ENCOUNTER — Ambulatory Visit: Admitting: Family Medicine

## 2023-09-01 ENCOUNTER — Encounter: Payer: Self-pay | Admitting: Family Medicine

## 2023-09-01 ENCOUNTER — Ambulatory Visit: Payer: Self-pay | Admitting: Family Medicine

## 2023-09-01 VITALS — BP 124/68 | HR 63 | Temp 96.0°F | Ht 65.0 in | Wt 187.2 lb

## 2023-09-01 DIAGNOSIS — I10 Essential (primary) hypertension: Secondary | ICD-10-CM | POA: Diagnosis not present

## 2023-09-01 DIAGNOSIS — N1831 Chronic kidney disease, stage 3a: Secondary | ICD-10-CM

## 2023-09-01 DIAGNOSIS — J0100 Acute maxillary sinusitis, unspecified: Secondary | ICD-10-CM

## 2023-09-01 DIAGNOSIS — Z7984 Long term (current) use of oral hypoglycemic drugs: Secondary | ICD-10-CM

## 2023-09-01 DIAGNOSIS — N183 Chronic kidney disease, stage 3 unspecified: Secondary | ICD-10-CM | POA: Diagnosis not present

## 2023-09-01 DIAGNOSIS — E782 Mixed hyperlipidemia: Secondary | ICD-10-CM | POA: Diagnosis not present

## 2023-09-01 DIAGNOSIS — I2510 Atherosclerotic heart disease of native coronary artery without angina pectoris: Secondary | ICD-10-CM | POA: Diagnosis not present

## 2023-09-01 DIAGNOSIS — D649 Anemia, unspecified: Secondary | ICD-10-CM | POA: Diagnosis not present

## 2023-09-01 DIAGNOSIS — I480 Paroxysmal atrial fibrillation: Secondary | ICD-10-CM

## 2023-09-01 DIAGNOSIS — E1122 Type 2 diabetes mellitus with diabetic chronic kidney disease: Secondary | ICD-10-CM

## 2023-09-01 LAB — CBC
HCT: 41.9 % (ref 36.0–46.0)
Hemoglobin: 14.1 g/dL (ref 12.0–15.0)
MCHC: 33.6 g/dL (ref 30.0–36.0)
MCV: 94 fl (ref 78.0–100.0)
Platelets: 243 10*3/uL (ref 150.0–400.0)
RBC: 4.46 Mil/uL (ref 3.87–5.11)
RDW: 17 % — ABNORMAL HIGH (ref 11.5–15.5)
WBC: 4.5 10*3/uL (ref 4.0–10.5)

## 2023-09-01 LAB — BASIC METABOLIC PANEL WITH GFR
BUN: 20 mg/dL (ref 6–23)
CO2: 26 meq/L (ref 19–32)
Calcium: 9.6 mg/dL (ref 8.4–10.5)
Chloride: 102 meq/L (ref 96–112)
Creatinine, Ser: 1.06 mg/dL (ref 0.40–1.20)
GFR: 50.33 mL/min — ABNORMAL LOW (ref >60.00)
Glucose, Bld: 84 mg/dL (ref 70–99)
Potassium: 3.7 meq/L (ref 3.5–5.1)
Sodium: 139 meq/L (ref 135–145)

## 2023-09-01 LAB — HEMOGLOBIN A1C: Hgb A1c MFr Bld: 6.1 % (ref 4.6–6.5)

## 2023-09-01 MED ORDER — AMOXICILLIN-POT CLAVULANATE 875-125 MG PO TABS: 1.0000 | ORAL_TABLET | Freq: Two times a day (BID) | ORAL | 0 refills | Status: DC

## 2023-09-01 NOTE — Assessment & Plan Note (Signed)
 Continue focus on blood pressure and glucose control, adequate hydration, and avoidance of nephrotoxic medications.

## 2023-09-01 NOTE — Assessment & Plan Note (Signed)
 Intermittent atrial fibrillation. Continue metoprolol  succinate 25 mg 1/2 tab daily for rate control, flecainide  50 mg PRN for rhythm control, and apixaban  5 mg bid for stroke prevention. I will check labs cardiology would like ahead of her ablation in July.

## 2023-09-01 NOTE — Progress Notes (Signed)
 [facility] [address] [place] [address] Dept: [phone] Dept Fax: [phone]  Chronic Care Office Visit  Subjective:    Patient ID: Dawn Herring, female    DOB: 1945-11-18, 78 y.o..   MRN: [id number]  Chief Complaint  Patient presents with   Hypertension    3 month f/u HTN.    History of Present Illness:  Patient is in today for reassessment of chronic medical issues.  Ms. Mishkin has hypertension. She is managed on losartan  100 mg daily.   Ms. Risk has atrial fibrillation. She continues to note periodic episodes, primarily occurring at night. She is prescribed metoprolol  succinate 25 mg 1/2 tab daily for rate control. She also takes intermittent flecainide  50 mg for rhythm control when she flips into a fib. She is on apixaban  5 mg bid for stroke prevention. She is scheduled in early July for an ablation procedure.   Ms. Devonshire has periodic lower leg edema. She takes furosemide  20 mg daily and uses a potassium supplement.   Ms. Rog has a prior history of gout. She is manage on allopurinol  100 mg  daily.   Ms. Westby has hyperlipidemia. She has not been able to tolerate statins, developing both muscle pain and weakness. She had been on Praluent  for years, but then developed gout. Dr. Rodell Citrin felt this was likely a trigger for this. She is now on ezetimibe  10 mg daily.   Ms. Feigel has Type 2 diabetes. She is managed on metformin  500 mg daily.  Ms. Rittenhouse has been having left maxillary tenderness, nasal congestion,a dn tingling into her maxillary teeth. She has a prior history of sinus infections.  Past Medical History: Patient Active Problem List   Diagnosis Date Noted   Dupuytren contracture of right hand 06/05/2023   Iron  deficiency 06/02/2023   Normocytic anemia 06/01/2023   Vitamin B12 deficiency 03/03/2023   Statin myopathy 03/03/2023   Stage 3a chronic kidney disease (CKD) (HCC) 03/03/2023    Bradycardia 12/12/2022   Difficulty sleeping 06/10/2022   Trigger finger, left middle finger 06/01/2022   Diverticular disease of colon 10/13/2021   Epigastric pain 10/13/2021   Gastroesophageal reflux disease 10/13/2021   New onset of headaches after age 1 10/13/2021   Melena 09/15/2021   Gout 06/08/2021   Itching 06/08/2021   Cutaneous skin tags 10/27/2020   Type 2 diabetes with stage 3 chronic kidney disease GFR 30-59 (HCC) 04/29/2020   Idiopathic neuropathy 01/13/2017   Atrial fibrillation (HCC)    Essential hypertension    Non-alcoholic fatty liver disease 03/03/2015   Thyroid  nodule 01/26/2015   CAD (coronary artery disease) 01/26/2015   Obesity 09/05/2010   Hyperlipidemia 04/14/2010   Past Surgical History:  Procedure Laterality Date   ABDOMINAL EXPLORATION SURGERY     APPENDECTOMY  2017   CATARACT EXTRACTION, BILATERAL     EYE SURGERY  2019   LAPAROSCOPIC APPENDECTOMY N/A 12/02/2015   Procedure: APPENDECTOMY LAPAROSCOPIC;  Surgeon: Adalberto Acton, MD;  Location: [facility] OR;  Service: General;  Laterality: N/A;   ROOT CANAL  2013   ROOT CANAL     VAGINAL HYSTERECTOMY     abdominal hysterectomy   Family History  Problem Relation Age of Onset   Heart disease Mother    Hyperlipidemia Mother    Arrhythmia Mother    Stroke Father    Heart disease Father    Anemia Father    Hyperlipidemia Sister    Hypothyroidism Sister  Cancer Brother        Thyroid    Heart disease Brother    Stroke Brother    Arrhythmia Brother    Diabetes Maternal Aunt    Cancer Paternal Grandmother        Colon   Colon cancer Other        Grandparent   Outpatient Medications Prior to Visit  Medication Sig Dispense Refill   allopurinol  (ZYLOPRIM ) 100 MG tablet Take 1 tablet (100 mg total) by mouth daily. 90 tablet 1   CALCIUM-MAGNESIUM-ZINC PO Take by mouth daily.     cyanocobalamin  (VITAMIN B12) 1000 MCG tablet Take 1,000 mcg by mouth daily. Patient states she takes it three times a  week     ELIQUIS  5 MG TABS tablet TAKE 1 TABLET BY MOUTH TWICE A DAY 180 tablet 1   ezetimibe  (ZETIA ) 10 MG tablet TAKE 1 TABLET BY MOUTH EVERY DAY 90 tablet 3   ferrous sulfate  325 (65 FE) MG tablet TAKE 1 TAB BY MOUTH DAILY 30 tablet 2   flecainide  (TAMBOCOR ) 50 MG tablet TAKE 1 TABLET BY MOUTH TWICE A DAY AS NEEDED 180 tablet 3   furosemide  (LASIX ) 20 MG tablet TAKE 1 TABLET BY MOUTH EVERY DAY 90 tablet 3   losartan  (COZAAR ) 100 MG tablet Take 1 tablet (100 mg total) by mouth daily. 90 tablet 2   metFORMIN  (GLUCOPHAGE ) 500 MG tablet TAKE 1 TABLET BY MOUTH 2 TIMES DAILY WITH A MEAL. 180 tablet 3   metoprolol  succinate (TOPROL -XL) 25 MG 24 hr tablet TAKE 1 TABLET BY MOUTH DAILY TAKE WITH OR IMMEDIATELY FOLLOWING A MEAL. (Patient taking differently: 12.5 mg. TAKE 1 TABLET BY MOUTH DAILY TAKE WITH OR IMMEDIATELY FOLLOWING A MEAL.)     omeprazole  (PRILOSEC) 20 MG capsule TAKE 1 CAPSULE (20 MG TOTAL) BY MOUTH 2 (TWO) TIMES DAILY BEFORE A MEAL. 180 capsule 1   POTASSIUM CHLORIDE PO Take by mouth daily.     No facility-administered medications prior to visit.   Allergies  Allergen Reactions   Statins     REACTION: Has terrible reactions; abdominal pain   Objective:   Today's Vitals   09/01/23 1005  BP: 124/68  Pulse: 63  Temp: (!) 96 F (35.6 C)  TempSrc: Temporal  SpO2: 97%  Weight: 187 lb 3.2 oz (84.9 kg)  Height: 5\' 5"  (1.651 m)   Body mass index is 31.15 kg/m.   General: Well developed, well nourished. No acute distress. HEENT: Normocephalic, non-traumatic. Nose with moderate congestion on the left. Mild tenderness to percussion over   the left maxillary sinus. Mucous membranes moist. Oropharynx clear. Good dentition. CV: RRR without murmurs or rubs. Pulses 2+ bilaterally. Psych: Alert and oriented. Normal mood and affect.  Health Maintenance Due  Topic Date Due   DTaP/Tdap/Td (1 - Tdap) Never done   OPHTHALMOLOGY EXAM  06/28/2023     Assessment & Plan:   Problem List  Items Addressed This Visit       Cardiovascular and Mediastinum   Essential hypertension - Primary (Chronic)   Blood pressure is in good control. Continue losartan  100 mg daily.       Relevant Orders   Basic metabolic panel with GFR   Atrial fibrillation (HCC)   Intermittent atrial fibrillation. Continue metoprolol  succinate 25 mg 1/2 tab daily for rate control, flecainide  50 mg PRN for rhythm control, and apixaban  5 mg bid for stroke prevention. I will check labs cardiology would like ahead of her ablation in July.  CAD (coronary artery disease)   Stable. No angina. Continue metoprolol  succinate 25 mg 1/2 tab daily.        Endocrine   Type 2 diabetes with stage 3 chronic kidney disease GFR 30-59 (HCC)   In good control. I will reassess an A1c. Continue metformin  500 mg daily.      Relevant Orders   Basic metabolic panel with GFR   Hemoglobin A1c     Genitourinary   Stage 3a chronic kidney disease (CKD) (HCC)   Continue focus on blood pressure and glucose control, adequate hydration, and avoidance of nephrotoxic medications.       Relevant Orders   Basic metabolic panel with GFR     Other   Hyperlipidemia (Chronic)   Intolerant of statins and unable to take alirocumab . Lipids at high, but options are limited. Continue ezetimibe  10 mg daily.      Normocytic anemia   I will reassess CBC.      Relevant Orders   CBC   Other Visit Diagnoses       Acute non-recurrent maxillary sinusitis       Exam c/w sinusitis. I will treat her with a 10-day course of Augmentin.   Relevant Medications   amoxicillin-clavulanate (AUGMENTIN) 875-125 MG tablet       Return in about 3 months (around 12/02/2023) for Reassessment.   Graig Lawyer, MD

## 2023-09-01 NOTE — Assessment & Plan Note (Signed)
 Stable. No angina. Continue metoprolol succinate 25 mg 1/2 tab daily.

## 2023-09-01 NOTE — Assessment & Plan Note (Signed)
 I will reassess CBC.

## 2023-09-01 NOTE — Assessment & Plan Note (Signed)
 In good control. I will reassess an A1c. Continue metformin  500 mg daily.

## 2023-09-01 NOTE — Assessment & Plan Note (Signed)
 Intolerant of statins and unable to take alirocumab. Lipids at high, but options are limited. Continue ezetimibe 10 mg daily.

## 2023-09-01 NOTE — Assessment & Plan Note (Signed)
 Blood pressure is in good control. Continue losartan 100 mg daily.

## 2023-09-08 ENCOUNTER — Telehealth: Payer: Self-pay

## 2023-09-08 NOTE — Telephone Encounter (Signed)
 Spoke with patient to complete pre-procedure call.     New medical conditions?  NO Recent hospitalizations or surgeries? NO  Started any new medications? NO Patient made aware to contact office to inform of any new medications started. Any changes in activities of daily living? NO   Pre-procedure testing scheduled: CT on Tuesday, June 17th along with lab work after.  Confirmed patient is taking Eliquis  5mg  twice daily and will continue taking medication before procedure or it may need to be rescheduled.  Confirmed patient is scheduled for Atrial Fibrillation Ablation on Tuesday, July 8 with Dr. Clinton Danas. Instructed patient to arrive at the Main Entrance A at [facility]: [address] [place], [place] [address] and check in at Admitting at 5:30 AM  Advised of plan to go home the same day and will only stay overnight if medically necessary. You MUST have a responsible adult to drive you home and MUST be with you the first 24 hours after you arrive home or your procedure could be cancelled.  Patient verbalized understanding to information provided and is agreeable to proceed with procedure.

## 2023-09-12 ENCOUNTER — Ambulatory Visit (HOSPITAL_COMMUNITY)
Admission: RE | Admit: 2023-09-12 | Discharge: 2023-09-12 | Disposition: A | Discharge status: Home / Self Care | Source: Ambulatory Visit | Attending: Cardiology | Admitting: Cardiology

## 2023-09-12 DIAGNOSIS — Q2112 Patent foramen ovale: Secondary | ICD-10-CM | POA: Diagnosis not present

## 2023-09-12 DIAGNOSIS — I480 Paroxysmal atrial fibrillation: Secondary | ICD-10-CM | POA: Diagnosis not present

## 2023-09-12 MED ORDER — IOHEXOL 350 MG/ML SOLN
100.0000 mL | Freq: Once | INTRAVENOUS | Status: AC | PRN
Start: 2023-09-12 — End: 2023-09-12
  Administered 2023-09-12: 100 mL via INTRAVENOUS

## 2023-09-13 ENCOUNTER — Ambulatory Visit (INDEPENDENT_AMBULATORY_CARE_PROVIDER_SITE_OTHER): Admitting: Podiatry

## 2023-09-13 ENCOUNTER — Encounter: Payer: Self-pay | Admitting: Podiatry

## 2023-09-13 DIAGNOSIS — E1142 Type 2 diabetes mellitus with diabetic polyneuropathy: Secondary | ICD-10-CM

## 2023-09-13 DIAGNOSIS — I480 Paroxysmal atrial fibrillation: Secondary | ICD-10-CM | POA: Diagnosis not present

## 2023-09-13 DIAGNOSIS — L603 Nail dystrophy: Secondary | ICD-10-CM | POA: Diagnosis not present

## 2023-09-13 NOTE — Progress Notes (Signed)
 This patient returns to my office for at risk foot care.  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  This patient is unable to cut nails herself since the patient cannot reach hernails.These nails are painful walking and wearing shoes.  This patient presents for at risk foot care today.  General Appearance  Alert, conversant and in no acute stress.  Vascular  Dorsalis pedis and posterior tibial  pulses are palpable  bilaterally/diminished .  Capillary return is within normal limits  bilaterally. Temperature is within normal limits  bilaterally.  Neurologic  Senn-Weinstein monofilament wire test within normal limits  bilaterally. Muscle power within normal limits bilaterally.  Nails Thick disfigured discolored nails with subungual debris  hallux nails bilaterally. No evidence of bacterial infection or drainage bilaterally.  Orthopedic  No limitations of motion  feet .  No crepitus or effusions noted.  No bony pathology or digital deformities noted. HAV left foot.  Skin  normotropic skin with no porokeratosis noted bilaterally.  No signs of infections or ulcers noted.     Onychomycosis  Pain in right toes  Pain in left toes  Consent was obtained for treatment procedures.   Mechanical debridement of nails 1-5  bilaterally performed with a nail nipper.  Filed with dremel without incident. No evidence of vascular or neurological pathology both feet.   Return office visit   4 months                   Told patient to return for periodic foot care and evaluation due to potential at risk complications.   Ruffin Cotton DPM

## 2023-09-14 ENCOUNTER — Ambulatory Visit: Payer: Self-pay

## 2023-09-14 LAB — CBC
Hematocrit: 41.5 % (ref 34.0–46.6)
Hemoglobin: 13.9 g/dL (ref 11.1–15.9)
MCH: 32.4 pg (ref 26.6–33.0)
MCHC: 33.5 g/dL (ref 31.5–35.7)
MCV: 97 fL (ref 79–97)
Platelets: 249 10*3/uL (ref 150–450)
RBC: 4.29 x10E6/uL (ref 3.77–5.28)
RDW: 14.7 % (ref 11.7–15.4)
WBC: 4 10*3/uL (ref 3.4–10.8)

## 2023-09-14 LAB — BASIC METABOLIC PANEL WITH GFR
BUN/Creatinine Ratio: 18 (ref 12–28)
BUN: 14 mg/dL (ref 8–27)
CO2: 18 mmol/L — ABNORMAL LOW (ref 20–29)
Calcium: 9.5 mg/dL (ref 8.7–10.3)
Chloride: 105 mmol/L (ref 96–106)
Creatinine, Ser: 0.78 mg/dL (ref 0.57–1.00)
Glucose: 89 mg/dL (ref 70–99)
Potassium: 4.1 mmol/L (ref 3.5–5.2)
Sodium: 142 mmol/L (ref 134–144)
eGFR: 78 mL/min/{1.73_m2} (ref >59)

## 2023-09-22 ENCOUNTER — Telehealth (HOSPITAL_COMMUNITY): Payer: Self-pay

## 2023-09-22 NOTE — Telephone Encounter (Signed)
 Spoke with patient to discuss upcoming procedure.   CT: completed.  Labs: completed.   Any recent signs of acute illness or been started on antibiotics? Completed Amoxicillin  June 15 for sinus infection- states completely recovered. Any new medications started? No Any medications to hold? No Any missed doses of blood thinner?  No Advised patient to continue taking ANTICOAGULANT: Eliquis  (Apixaban ) twice daily without missing any doses.  Medication instructions:  On the morning of your procedure DO NOT take any medication., including Eliquis  or the procedure may be rescheduled. Nothing to eat or drink after midnight prior to your procedure.  Confirmed patient is scheduled for Atrial Fibrillation Ablation on Tuesday, July 8 with Dr. Sidra Kitty. Instructed patient to arrive at the Main Entrance A at [facility]: [address] [place], [place] [address] and check in at Admitting at 5:30 AM.  Advised of plan to go home the same day and will only stay overnight if medically necessary. You MUST have a responsible adult to drive you home and MUST be with you the first 24 hours after you arrive home or your procedure could be cancelled.  Patient verbalized understanding to all instructions provided and agreed to proceed with procedure.

## 2023-09-27 ENCOUNTER — Encounter: Payer: Self-pay | Admitting: Emergency Medicine

## 2023-10-02 NOTE — Pre-Procedure Instructions (Signed)
 Instructed patient on the following items: Arrival time 0515 Nothing to eat or drink after midnight No meds AM of procedure Responsible person to drive you home and stay with you for 24 hrs  Have you missed any doses of anti-coagulant Eliquis- takes twice a day, hasn't missed any doses in last 4 weeks.  Don't take morning of procedure.

## 2023-10-03 ENCOUNTER — Ambulatory Visit (HOSPITAL_COMMUNITY)

## 2023-10-03 ENCOUNTER — Encounter (HOSPITAL_COMMUNITY): Payer: Self-pay | Admitting: Cardiology

## 2023-10-03 ENCOUNTER — Ambulatory Visit (HOSPITAL_COMMUNITY)
Admission: RE | Admit: 2023-10-03 | Discharge: 2023-10-03 | Disposition: A | Discharge status: Home / Self Care | Source: Ambulatory Visit | Attending: Cardiology | Admitting: Cardiology

## 2023-10-03 ENCOUNTER — Encounter (HOSPITAL_COMMUNITY)
Admission: RE | Disposition: A | Discharge status: Home / Self Care | Payer: Self-pay | Source: Ambulatory Visit | Attending: Cardiology

## 2023-10-03 ENCOUNTER — Other Ambulatory Visit: Payer: Self-pay

## 2023-10-03 DIAGNOSIS — I10 Essential (primary) hypertension: Secondary | ICD-10-CM | POA: Diagnosis not present

## 2023-10-03 DIAGNOSIS — I2510 Atherosclerotic heart disease of native coronary artery without angina pectoris: Secondary | ICD-10-CM

## 2023-10-03 DIAGNOSIS — I3139 Other pericardial effusion (noninflammatory): Secondary | ICD-10-CM | POA: Diagnosis not present

## 2023-10-03 DIAGNOSIS — D6869 Other thrombophilia: Secondary | ICD-10-CM | POA: Insufficient documentation

## 2023-10-03 DIAGNOSIS — Z7901 Long term (current) use of anticoagulants: Secondary | ICD-10-CM | POA: Diagnosis not present

## 2023-10-03 DIAGNOSIS — I129 Hypertensive chronic kidney disease with stage 1 through stage 4 chronic kidney disease, or unspecified chronic kidney disease: Secondary | ICD-10-CM | POA: Insufficient documentation

## 2023-10-03 DIAGNOSIS — E1122 Type 2 diabetes mellitus with diabetic chronic kidney disease: Secondary | ICD-10-CM | POA: Diagnosis not present

## 2023-10-03 DIAGNOSIS — I480 Paroxysmal atrial fibrillation: Secondary | ICD-10-CM

## 2023-10-03 DIAGNOSIS — N1831 Chronic kidney disease, stage 3a: Secondary | ICD-10-CM

## 2023-10-03 HISTORY — PX: ATRIAL FIBRILLATION ABLATION: EP1191

## 2023-10-03 LAB — POCT ACTIVATED CLOTTING TIME
Activated Clotting Time: 320 s
Activated Clotting Time: 349 s

## 2023-10-03 LAB — GLUCOSE, CAPILLARY
Glucose-Capillary: 108 mg/dL — ABNORMAL HIGH (ref 70–99)
Glucose-Capillary: 131 mg/dL — ABNORMAL HIGH (ref 70–99)

## 2023-10-03 MED ORDER — EPHEDRINE SULFATE-NACL 50-0.9 MG/10ML-% IV SOSY
PREFILLED_SYRINGE | INTRAVENOUS | Status: DC | PRN
  Administered 2023-10-03: 10 mg via INTRAVENOUS

## 2023-10-03 MED ORDER — LABETALOL HCL 5 MG/ML IV SOLN
INTRAVENOUS | Status: DC | PRN
Start: 2023-10-03 — End: 2023-10-03
  Administered 2023-10-03 (×2): 5 mg via INTRAVENOUS

## 2023-10-03 MED ORDER — SODIUM CHLORIDE 0.9% FLUSH: 3.0000 mL | INTRAVENOUS | Status: DC | PRN

## 2023-10-03 MED ORDER — ATROPINE SULFATE 0.4 MG/ML IV SOLN
INTRAVENOUS | Status: DC | PRN
  Administered 2023-10-03: 1 mg via INTRAVENOUS

## 2023-10-03 MED ORDER — ONDANSETRON HCL 4 MG/2ML IJ SOLN: 4.0000 mg | Freq: Four times a day (QID) | INTRAMUSCULAR | Status: DC | PRN

## 2023-10-03 MED ORDER — HEPARIN SODIUM (PORCINE) 1000 UNIT/ML IJ SOLN
INTRAMUSCULAR | Status: DC | PRN
  Administered 2023-10-03: 13000 [IU] via INTRAVENOUS
  Administered 2023-10-03: 1000 [IU] via INTRAVENOUS

## 2023-10-03 MED ORDER — SODIUM CHLORIDE 0.9% FLUSH: 3.0000 mL | Freq: Two times a day (BID) | INTRAVENOUS | Status: DC

## 2023-10-03 MED ORDER — ONDANSETRON HCL 4 MG/2ML IJ SOLN
INTRAMUSCULAR | Status: DC | PRN
  Administered 2023-10-03: 4 mg via INTRAVENOUS

## 2023-10-03 MED ORDER — ACETAMINOPHEN 325 MG PO TABS: 650.0000 mg | ORAL_TABLET | ORAL | Status: DC | PRN

## 2023-10-03 MED ORDER — SODIUM CHLORIDE 0.9 % IV SOLN: INTRAVENOUS | Status: DC

## 2023-10-03 MED ORDER — PHENYLEPHRINE HCL-NACL 20-0.9 MG/250ML-% IV SOLN
INTRAVENOUS | Status: DC | PRN
  Administered 2023-10-03: 30 ug/min via INTRAVENOUS

## 2023-10-03 MED ORDER — FENTANYL CITRATE (PF) 250 MCG/5ML IJ SOLN
INTRAMUSCULAR | Status: DC | PRN
  Administered 2023-10-03 (×2): 50 ug via INTRAVENOUS

## 2023-10-03 MED ORDER — PROTAMINE SULFATE 10 MG/ML IV SOLN
INTRAVENOUS | Status: DC | PRN
Start: 2023-10-03 — End: 2023-10-03
  Administered 2023-10-03 (×3): 5 mg via INTRAVENOUS
  Administered 2023-10-03: 20 mg via INTRAVENOUS

## 2023-10-03 MED ORDER — ROCURONIUM BROMIDE 10 MG/ML (PF) SYRINGE
PREFILLED_SYRINGE | INTRAVENOUS | Status: DC | PRN
Start: 2023-10-03 — End: 2023-10-03
  Administered 2023-10-03: 20 mg via INTRAVENOUS
  Administered 2023-10-03: 50 mg via INTRAVENOUS

## 2023-10-03 MED ORDER — PHENYLEPHRINE 80 MCG/ML (10ML) SYRINGE FOR IV PUSH (FOR BLOOD PRESSURE SUPPORT)
PREFILLED_SYRINGE | INTRAVENOUS | Status: DC | PRN
  Administered 2023-10-03: 160 ug via INTRAVENOUS
  Administered 2023-10-03: 80 ug via INTRAVENOUS
  Administered 2023-10-03: 160 ug via INTRAVENOUS
  Administered 2023-10-03: 80 ug via INTRAVENOUS

## 2023-10-03 MED ORDER — DEXAMETHASONE SODIUM PHOSPHATE 10 MG/ML IJ SOLN
INTRAMUSCULAR | Status: DC | PRN
  Administered 2023-10-03: 5 mg via INTRAVENOUS

## 2023-10-03 MED ORDER — PROPOFOL 10 MG/ML IV BOLUS
INTRAVENOUS | Status: DC | PRN
  Administered 2023-10-03: 50 mg via INTRAVENOUS
  Administered 2023-10-03: 150 mg via INTRAVENOUS

## 2023-10-03 MED ORDER — LACTATED RINGERS IV SOLN: INTRAVENOUS | Status: DC | PRN

## 2023-10-03 MED ORDER — PROPOFOL 1000 MG/100ML IV EMUL
INTRAVENOUS | Status: AC
  Filled 2023-10-03: qty 100

## 2023-10-03 MED ORDER — SUGAMMADEX SODIUM 200 MG/2ML IV SOLN
INTRAVENOUS | Status: DC | PRN
  Administered 2023-10-03: 200 mg via INTRAVENOUS

## 2023-10-03 MED ORDER — SODIUM CHLORIDE 0.9 % IV SOLN: 250.0000 mL | INTRAVENOUS | Status: DC | PRN

## 2023-10-03 MED ORDER — HEPARIN (PORCINE) IN NACL 1000-0.9 UT/500ML-% IV SOLN
INTRAVENOUS | Status: DC | PRN
  Administered 2023-10-03 (×3): 500 mL

## 2023-10-03 MED ORDER — FENTANYL CITRATE (PF) 100 MCG/2ML IJ SOLN
INTRAMUSCULAR | Status: AC
  Filled 2023-10-03: qty 2

## 2023-10-03 MED ORDER — APIXABAN 5 MG PO TABS
5.0000 mg | ORAL_TABLET | Freq: Once | ORAL | Status: AC
  Administered 2023-10-03: 5 mg via ORAL
  Filled 2023-10-03: qty 1

## 2023-10-03 MED ORDER — ONDANSETRON HCL 4 MG/2ML IJ SOLN
INTRAMUSCULAR | Status: AC
Start: 2023-10-03 — End: 2023-10-03
  Filled 2023-10-03: qty 2

## 2023-10-03 NOTE — Anesthesia Postprocedure Evaluation (Signed)
 Anesthesia Post Note  Patient: Dawn Herring  Procedure(s) Performed: ATRIAL FIBRILLATION ABLATION     Patient location during evaluation: PACU Anesthesia Type: General Level of consciousness: awake and alert Pain management: pain level controlled Vital Signs Assessment: post-procedure vital signs reviewed and stable Respiratory status: spontaneous breathing, nonlabored ventilation, respiratory function stable and patient connected to nasal cannula oxygen Cardiovascular status: blood pressure returned to baseline and stable Postop Assessment: no apparent nausea or vomiting Anesthetic complications: no   No notable events documented.  Last Vitals:  Vitals:   10/03/23 1130 10/03/23 1200  BP: 132/62 126/66  Pulse: (!) 54 66  Resp: 10 (!) 22  Temp:    SpO2: 99% 98%    Last Pain:  Vitals:   10/03/23 0945  TempSrc: Axillary                 Keanu Lesniak S

## 2023-10-03 NOTE — Anesthesia Preprocedure Evaluation (Signed)
 Anesthesia Evaluation  Patient identified by MRN, date of birth, ID band Patient awake    Reviewed: Allergy & Precautions, H&P , NPO status , Patient's Chart, lab work & pertinent test results  Airway Mallampati: II   Neck ROM: full    Dental   Pulmonary neg pulmonary ROS   breath sounds clear to auscultation       Cardiovascular hypertension, + CAD  + dysrhythmias Atrial Fibrillation  Rhythm:irregular Rate:Normal     Neuro/Psych  Headaches  Neuromuscular disease    GI/Hepatic hiatal hernia,GERD  ,,  Endo/Other  diabetes, Type 2    Renal/GU      Musculoskeletal   Abdominal   Peds  Hematology   Anesthesia Other Findings   Reproductive/Obstetrics                              Anesthesia Physical Anesthesia Plan  ASA: 3  Anesthesia Plan: General   Post-op Pain Management:    Induction: Intravenous  PONV Risk Score and Plan: 3 and Ondansetron , Dexamethasone  and Treatment may vary due to age or medical condition  Airway Management Planned: Oral ETT  Additional Equipment:   Intra-op Plan:   Post-operative Plan: Extubation in OR  Informed Consent: I have reviewed the patients History and Physical, chart, labs and discussed the procedure including the risks, benefits and alternatives for the proposed anesthesia with the patient or authorized representative who has indicated his/her understanding and acceptance.     Dental advisory given  Plan Discussed with: CRNA, Anesthesiologist and Surgeon  Anesthesia Plan Comments:         Anesthesia Quick Evaluation

## 2023-10-03 NOTE — Progress Notes (Signed)
 Patient arrived  to cath lab holding bay 19. Responds to voice. Follows commands and answers questions appropriately. Bilateral groin dressings are dry/intact with no bleeding or hematoma noted. Post activity and precautions explained. Will continue to monitor per orders and protocol.Dawn Herring

## 2023-10-03 NOTE — Discharge Instructions (Signed)

## 2023-10-03 NOTE — Transfer of Care (Signed)
 Immediate Anesthesia Transfer of Care Note  Patient: Dawn Herring  Procedure(s) Performed: ATRIAL FIBRILLATION ABLATION  Patient Location: PACU  Anesthesia Type:General  Level of Consciousness: drowsy and patient cooperative  Airway & Oxygen Therapy: Patient Spontanous Breathing and Patient connected to face mask oxygen  Post-op Assessment: Report given to RN and Post -op Vital signs reviewed and stable  Post vital signs: Reviewed and stable  Last Vitals:  Vitals Value Taken Time  BP 102/61 10/03/23 09:35  Temp    Pulse 63 10/03/23 09:40  Resp 15 10/03/23 09:40  SpO2 93 % 10/03/23 09:40  Vitals shown include unfiled device data.  Last Pain:  Vitals:   10/03/23 0533  TempSrc: Oral         Complications: No notable events documented.

## 2023-10-03 NOTE — H&P (Signed)
 Electrophysiology Note:   Date:  7/8/5 ID:  Dawn Herring, DOB [birth year], MRN [id number]   Primary Cardiologist: Evalene Lunger, MD Electrophysiologist: Fonda Kitty, MD       History of Present Illness:   Dawn Herring is a 78 y.o. female with h/o paroxysmal atrial fibrillation, hypertension, hyperlipidemia, type 2 diabetes, CKD stage IIIa who is being seen today for evaluation for catheter ablation.   Patient reported increased episodes of A-fib starting in December typically occurring at night waking her from sleep. She denied palpitations but felt tightness between her shoulder blades when she felt she was having an A-fib episode. She took flecainide  x 2 on Christmas Day. She was monitoring her rhythm closely with her Apple Watch.  Episodes resolve within 60-90 minutes after dose of flecainide . She feels she is having more episodes of afib since January.    Discussed the use of AI scribe software for clinical note transcription with the patient, who gave verbal consent to proceed.   History of Present Illness Dawn Herring is a 78 year old female with atrial fibrillation who presents with increased frequency of episodes and low heart rate.   She has a history of atrial fibrillation diagnosed approximately three years ago, initially confirmed by a Zio. She manages her condition with flecainide  on an as-needed basis, typically when episodes wake her from sleep. Recently, she experienced a significant drop in her heart rate, with her pulse falling into the forties during the daytime, as confirmed by her watch. This low heart rate persisted for several days, leading to severe fatigue.   In response to the low heart rate, her metoprolol  dosage was adjusted from 25 mg taken at supper to half a dose taken in the morning since July 13, 2023. Despite this adjustment, her heart rate remains low, typically in the forties, with occasional increases to 77, and a recent reading of 122 while walking in  the rain.   Her atrial fibrillation episodes occur approximately twice a month, lasting about two hours each, although they can be longer during times of stress or illness, such as a respiratory infection. She notes that her episodes are more frequent and sometimes require taking flecainide  twice in one day, as happened on Christmas Day when she had a prolonged episode due to stress from hosting a large gathering.    Interval: Patient presents today for planned catheter ablation. Reports feeling relatively well. Had episode of AF lasting about 2 hours recently. Otherwise no new or acute complaints.   Review of systems complete and found to be negative unless listed in HPI.    EP Information / Studies Reviewed:      EKG Interpretation Date/Time:                  Tuesday Aug 22 2023 10:20:38 EDT Ventricular Rate:         58 PR Interval:                 194 QRS Duration:             76 QT Interval:                 444 QTC Calculation:435 R Axis:                         0   Text Interpretation:Sinus bradycardia Low voltage QRS T wave abnormality, consider lateral ischemia When compared with ECG of 14-Apr-2023 10:56, Nonspecific  T wave abnormality has replaced inverted T waves in Anterior leads Confirmed by Kennyth Chew [phone]) on 08/22/2023 10:50:11 AM    Zio 07/2023:     Zio 04/2020:     Risk Assessment/Calculations:     CHA2DS2-VASc Score = 5   This indicates a 7.2% annual risk of stroke. The patient's score is based upon: CHF History: 0 HTN History: 1 Diabetes History: 1 Stroke History: 0 Vascular Disease History: 0 Age Score: 2 Gender Score: 1               Physical Exam:    Today's Vitals   10/03/23 0533  BP: (!) 173/64  Pulse: (!) 56  Resp: 18  Temp: 97.6 F (36.4 C)  TempSrc: Oral  SpO2: 93%  Weight: 83 kg  Height: 5' 5 (1.651 m)   Body mass index is 30.45 kg/m.  GEN: Well nourished, well developed in no acute distress NECK: No JVD CARDIAC: Normal  rate, regular rhythm. RESPIRATORY:  Clear to auscultation without rales, wheezing or rhonchi  ABDOMEN: Soft, non-distended EXTREMITIES:  No edema; No deformity    ASSESSMENT AND PLAN:     #. Paroxysmal atrial fibrillation, symptomatic: Increasing burden since December 2024. #. Secondary hypercoagulable state due to atrial fibrillation:  #. High risk medication use: Flecainide .   #. Sinus bradycardia: Asymptomatic, but limits antiarrhythmic and rate controlling options. - Discussed treatment options today for AF including changing antiarrhythmic drug therapy and ablation.  We discussed making her flecainide  scheduled dosing rather than as needed.  Given her resting bradycardia, we also discussed transitioning to dofetilide; all other antiarrhythmic options would likely result in some slowing of heart rates.  Discussed risks, recovery and likelihood of success with each treatment strategy. Risk, benefits, and alternatives to EP study and ablation for afib were discussed. These risks include but are not limited to stroke, bleeding, vascular damage, tamponade, perforation, damage to the esophagus, lungs, phrenic nerve and other structures, pulmonary vein stenosis, worsening renal function, coronary vasospasm and death.  Discussed potential need for repeat ablation procedures and antiarrhythmic drugs after an initial ablation. The patient understands these risk and wishes to proceed with ablation today.  - Continue flecainide  and reduced dose metoprolol  until ablation.  - Continue Eliquis  5 mg twice daily.    Follow up with Dr. Kennyth 3 months after ablation.    Signed, Chew Kennyth, MD

## 2023-10-03 NOTE — Progress Notes (Addendum)
 Up and walked and tolerated well; bilat groins stable, no bleeding or hematoma; Dr Kennyth in to see and ok to d/c home

## 2023-10-03 NOTE — Anesthesia Procedure Notes (Signed)
 Procedure Name: Intubation Date/Time: 10/03/2023 7:35 AM  Performed by: Zelphia Norleen HERO, CRNAPre-anesthesia Checklist: Patient identified, Emergency Drugs available, Suction available, Patient being monitored and Timeout performed Patient Re-evaluated:Patient Re-evaluated prior to induction Oxygen Delivery Method: Circle system utilized Preoxygenation: Pre-oxygenation with 100% oxygen Induction Type: IV induction Ventilation: Mask ventilation without difficulty Laryngoscope Size: Mac and 4 Grade View: Grade I Tube type: Oral Tube size: 7.0 mm Number of attempts: 1 Airway Equipment and Method: Stylet Placement Confirmation: ETT inserted through vocal cords under direct vision, positive ETCO2 and breath sounds checked- equal and bilateral Secured at: 23 cm Tube secured with: Tape Dental Injury: Teeth and Oropharynx as per pre-operative assessment

## 2023-10-04 ENCOUNTER — Telehealth (HOSPITAL_COMMUNITY): Payer: Self-pay

## 2023-10-04 NOTE — Telephone Encounter (Signed)
 Spoke with patient to complete post procedure follow up call.  Patient reports no complications with groin sites.   Instructions reviewed with patient:  Remove large bandage at puncture site after 24 hours. It is normal to have bruising, tenderness, mild swelling, and a pea or marble sized lump/knot at the groin site which can take up to three months to resolve.  Get help right away if you notice sudden swelling at the puncture site.  Check your puncture site every day for signs of infection: fever, redness, swelling, pus drainage, warmth, foul odor or excessive pain. If this occurs, please call the office at [phone], to speak with the nurse. Get help right away if your puncture site is bleeding and the bleeding does not stop after applying firm pressure to the area.  You may continue to have skipped beats/ atrial fibrillation during the first several months after your procedure.  It is very important not to miss any doses of your blood thinner Eliquis .    You will follow up with the APP on 11/14/23 and follow up with the APP on 01/03/24.   Patient verbalized understanding to all instructions provided.

## 2023-10-06 MED FILL — Propofol IV Emul 1000 MG/100ML (10 MG/ML): INTRAVENOUS | Qty: 100 | Status: AC

## 2023-10-06 MED FILL — Fentanyl Citrate Preservative Free (PF) Inj 100 MCG/2ML: INTRAMUSCULAR | Qty: 2 | Status: AC

## 2023-10-27 ENCOUNTER — Other Ambulatory Visit: Payer: Self-pay | Admitting: Cardiovascular Disease

## 2023-10-27 DIAGNOSIS — I480 Paroxysmal atrial fibrillation: Secondary | ICD-10-CM

## 2023-10-27 NOTE — Telephone Encounter (Signed)
 Prescription refill request for Eliquis  received. Indication:afib Last office visit:5/25 Scr:0.78  6/25 Age: 78 Weight:83  kg  Prescription refilled

## 2023-11-14 ENCOUNTER — Ambulatory Visit: Attending: Cardiology | Admitting: Cardiology

## 2023-11-14 VITALS — BP 112/52 | HR 56 | Resp 94 | Ht 65.0 in | Wt 185.8 lb

## 2023-11-14 DIAGNOSIS — D6869 Other thrombophilia: Secondary | ICD-10-CM | POA: Insufficient documentation

## 2023-11-14 DIAGNOSIS — Z79899 Other long term (current) drug therapy: Secondary | ICD-10-CM | POA: Insufficient documentation

## 2023-11-14 DIAGNOSIS — Z5181 Encounter for therapeutic drug level monitoring: Secondary | ICD-10-CM | POA: Insufficient documentation

## 2023-11-14 DIAGNOSIS — I480 Paroxysmal atrial fibrillation: Secondary | ICD-10-CM | POA: Insufficient documentation

## 2023-11-14 NOTE — Patient Instructions (Signed)
 Medication Instructions:  Your physician recommends that you continue on your current medications as directed. Please refer to the Current Medication list given to you today.    *If you need a refill on your cardiac medications before your next appointment, please call your pharmacy*  Lab Work: No labs ordered today    Testing/Procedures: No test ordered today   Follow-Up: At [facility], you and your health needs are our priority.  As part of our continuing mission to provide you with exceptional heart care, our providers are all part of one team.  This team includes your primary Cardiologist (physician) and Advanced Practice Providers or APPs (Physician Assistants and Nurse Practitioners) who all work together to provide you with the care you need, when you need it.  Your next appointment:   01/03/24 @ 1:30 pm  Provider:   Suzann Riddle, NP

## 2023-11-14 NOTE — Progress Notes (Signed)
 Electrophysiology Clinic Note    Date:  11/14/2023  Patient ID:  Dawn Herring 12-04-1945, MRN [id number] PCP:  Thedora Garnette HERO, MD  Cardiologist:  Evalene Lunger, MD   Electrophysiologist:  Fonda Kitty, MD  Electrophysiology APP:  Thurl Boen, NP    Discussed the use of AI scribe software for clinical note transcription with the patient, who gave verbal consent to proceed.   Patient Profile    Chief Complaint: AF ablation follow-up  History of Present Illness: Dawn Herring is a 78 y.o. female with PMH notable for parox Afib, HTN, HLD, T2DM, CKD-3; seen today for Fonda Kitty, MD for routine electrophysiology follow-up s/p AF Ablation.  She last saw Dr. Kitty 07/2023 for initial EP evaluation of her AFib. She was taking flecainide  PRN during episodes about 2/month, also having bradycardia with her daily metoprolol .  She is now s/p AF ablation w isolation of pulm veins and posterior wall 09/2023 by Dr. Kitty.   On follow-up today, she has had one AF episode since ablation. She believes episode was triggered by drinking black coffee as she does not normally drink cofee. She took PRN flecainide  and episode resolved within 2 hours. She continues to take 12.5mg  toprol  in AM.  Her heart rate was in the forties on two days last week, without dizziness, lightheadedness, or wooziness, but she felt a lack of energy. Her watch notified her of the bradycardia.   She denies chest pain, chest pressure, SOB, edema. She checked her groin sites daily after ablation and had no bruising, no longer has tenderness on either side.      Arrhythmia/Device History Flecainide  - PRN    ROS:  Please see the history of present illness. All other systems are reviewed and otherwise negative.    Physical Exam    VS:  BP (!) 112/52 (BP Location: Left Arm, Patient Position: Sitting, Cuff Size: Normal)   Pulse (!) 56   Resp (!) 94   Ht 5' 5 (1.651 m)   Wt 185 lb 12.8 oz (84.3 kg)   BMI  30.92 kg/m  BMI: Body mass index is 30.92 kg/m.      Wt Readings from Last 3 Encounters:  11/14/23 185 lb 12.8 oz (84.3 kg)  10/03/23 183 lb (83 kg)  09/01/23 187 lb 3.2 oz (84.9 kg)     GEN- The patient is well appearing, alert and oriented x 3 today.   Lungs- Clear to ausculation bilaterally, normal work of breathing.  Heart- Regular rate and rhythm, no murmurs, rubs or gallops Extremities- No peripheral edema, warm, dry    Studies Reviewed   Previous EP, cardiology notes.    EKG is ordered. Personal review of EKG from today shows:    EKG Interpretation Date/Time:  Tuesday November 14 2023 13:21:04 EDT Ventricular Rate:  56 PR Interval:  196 QRS Duration:  82 QT Interval:  452 QTC Calculation: 436 R Axis:   24  Text Interpretation: Sinus bradycardia Low voltage QRS Nonspecific T wave abnormality Confirmed by Dawn Herring [phone]) on 11/14/2023 1:26:19 PM    Cardiac CT, 09/12/2023 1. There is normal pulmonary vein drainage into the left atrium with ostial measurements above.  2. There is no thrombus in the left atrial appendage. 3. The esophagus runs to the left of the atrial midline and is in proximity to the left pulmonary vein ostia. 4. Small PFO is present. 5. Normal coronary origin. Right dominance. 6. CAC score of 1004 which is  92nd percentile for age-, race-, and sex-matched controls.  Long term monitor, 08/07/2023 Normal sinus rhythm Patient had a min HR of 41 bpm, max HR of 203 bpm, and avg HR of 57 bpm.    110 Supraventricular Tachycardia runs occurred, the run with the fastest interval lasting 13 beats with a max rate of 203 bpm, the longest lasting 22.3 secs with an avg rate of 93 bpm.    Isolated SVEs were occasional (1.9%, 17672), SVE Couplets were rare (<1.0%, 406), and SVE Triplets were rare (<1.0%, 91).    Isolated VEs were rare (<1.0%, 1725), VE Triplets were rare (<1.0%, 2), and no VE Couplets were present. Ventricular Bigeminy and Trigeminy were  present.   No patient triggered events recorded    Assessment and Plan     #) parox Afib #) flecainide  monitoring S/p AF ablation 09/2023 by Dr. Kennyth Improved AF burden since ablation Will continue flecainide  PRN and daily 12.5mg  toprol  Anticipate stopping both at next appt if AF burden continues to be low  #) Hypercoag d/t parox afib CHA2DS2-VASc Score = at least 5 [CHF History: 0, HTN History: 1, Diabetes History: 1, Stroke History: 0, Vascular Disease History: 0, Age Score: 2, Gender Score: 1].  Therefore, the patient's annual risk of stroke is 7.2 %.    Stroke ppx - 5mg  eliquis  BID, appropriately dosed No bleeding concerns        Current medicines are reviewed at length with the patient today.   The patient does not have concerns regarding her medicines.  The following changes were made today:  none  Labs/ tests ordered today include:  Orders Placed This Encounter  Procedures   EKG 12-Lead     Disposition: Follow up with Dr. Kennyth or EP APP in 6-8 weeks    Signed, Chantal Needle, NP  11/14/23  2:44 PM  Electrophysiology [facility]

## 2023-11-22 NOTE — Progress Notes (Signed)
 Office Visit Note  Patient: Dawn Herring             Date of Birth: [birth year]           MRN: [id number]             PCP: Thedora Garnette HERO, MD Referring: Thedora Garnette HERO, MD Visit Date: 12/06/2023   Subjective:  Follow-up (If she is stopping allopurinol  or not. )   Discussed the use of AI scribe software for clinical note transcription with the patient, who gave verbal consent to proceed.  History of Present Illness   History of Present Illness: Dawn Herring is a 78 y.o. female here for follow up for gout on allopurinol  150 mg daily.    She has not experienced any gout flare-ups since her allopurinol  dose was decreased. Her thumb joint is particularly bothersome, and she reports that it is worse in damp weather. The pain is worsening, causing difficulty with tasks such as opening jars.  She has been using Voltaren gel and occasionally which she finds helpful. She takes minimal pain medication, using approximately four Tylenol  a month. She uses a metal jar opener to assist with daily activities.  She is assisting a friend with unpacking, which may be contributing to increased use of her hands.  She has a history of atrial fibrillation and is currently on Eliquis . There is a family history of stroke, with her brother having had a stroke at age 58.    Previous HPI 12/05/2022 Dawn Herring is a 78 y.o. female here for follow up for gout on allopurinol  150 mg daily.   Since our last visit in March she had 1 episode of significantly increased joint pain and stiffness when she started on Praluent  injection for her cholesterol so stopped taking this medication.  Otherwise did not experience any flareups of gouty arthritis pain in her feet and ankles.  She continues having symptoms daily with her hands worst affected area is the right thumb where she gets pain and frequently visible swelling at the distal joint.  Also has some restricted mobility in her third finger on both hands left hand  consistently triggering on a daily basis.  Sometimes this will get stuck and cannot move it freely for hours without using her right hand for assistance.  She takes Tylenol  as needed for joint pain cannot use other medicines because of anticoagulation for A-fib.   Previous HPI 06/01/2022 Dawn Herring is a 78 y.o. female here for follow up for gout on allopurinol  150 mg daily and colchicine  0.6 mg daily prophylaxis.  Since her last visit she is doing better with less foot pain and swelling.  She has not had any major gout flareup.  Occasionally has some pain between the first and second toe radiating into the foot.  There is a bit more difficulty with range of motion in the third finger on both hands with a lot of left-sided triggering.  She had a few episodes with A-fib back under control quickly with taking the flecainide .   Previous HPI 03/02/22 Dawn Herring is a 78 y.o. female here for follow up for gout on allopurinol  150 mg daily and colchicine  prophylaxis after uric acid at goal in September. She has been generally feeling well since our last visit but has experienced foot pain and swelling starting Monday when she first stepped out of bed. This is worst at the outside edge of the right foot. Pain is worse with  walking on her foot and she started wearing her compression socks and ankle sleeve for this with some relief. Feels like early symptoms of previous gout flares. She has not experienced any repeat problems with elbow inflammation.    Previous HPI 12/30/21 Dawn Herring is a 78 y.o. female here for gout now on allopurinol  150 mg daily.  Gout episode started with pain and inflammation in the feet and ankles around June this year.  Subsequently developed increased joint pain and some swelling affecting her thumb and that in the right elbow.  She saw her PCP office and started treatment with prednisone  with initial improvement of symptoms.  In follow-up uric acid was found to remain elevated so  started allopurinol  treatment initially at 100 mg daily.  She was also started on colchicine  0.6 mg daily for flare prophylaxis.  Initial follow-up with repeat uric acid still above 6 so allopurinol  titrated to 150 mg and has been taking this dose for less than 1 month but uric acid did improve to 5.2.  So far she has not suffered any repeat episode after the right elbow inflammation.  Prior to onset of the initial flares with the lower extremities she was started on new medication by her cardiologist no other preceding illness or medical events.  She was seen in GI clinic for melena with upper GI ulcers identified and on long term anticoagulation for Afib. She was previously treated with indomethacin for inflammatory arthritis but not taking any NSAIDs at this time. Also with some peripheral neuropathic pain partially improved with gabapentin .  She reports a history of gout with 1 episode of podagra that occurred in her 64s.  She had a hysterectomy at age 19.  No history of kidney stones.  Her metabolic panel have shown mild renal impairment with estimated GFR in high 50s and stable.     Labs reviewed 11/2021 Uric acid 5.2 eGFR 53.98   10/2021 Uric acid 6.2 eGFR 53.39   09/2021 Uric acid 7.4 eGFR 55.36   Review of Systems  Constitutional:  Positive for fatigue.  HENT:  Positive for mouth dryness. Negative for mouth sores.   Eyes:  Negative for dryness.  Respiratory:  Positive for shortness of breath.   Cardiovascular:  Positive for palpitations. Negative for chest pain.  Gastrointestinal:  Negative for blood in stool, constipation and diarrhea.  Endocrine: Negative for increased urination.  Genitourinary:  Negative for involuntary urination.  Musculoskeletal:  Positive for joint pain, joint pain and morning stiffness. Negative for gait problem, joint swelling, myalgias, muscle weakness, muscle tenderness and myalgias.  Skin:  Negative for color change, rash, hair loss and sensitivity to  sunlight.  Allergic/Immunologic: Negative for susceptible to infections.  Neurological:  Negative for dizziness and headaches.  Hematological:  Negative for swollen glands.  Psychiatric/Behavioral:  Positive for sleep disturbance. Negative for depressed mood. The patient is not nervous/anxious.     PMFS History:  Patient Active Problem List   Diagnosis Date Noted   Diaphoresis 11/30/2023   Dupuytren contracture of right hand 06/05/2023   Iron  deficiency 06/02/2023   Normocytic anemia 06/01/2023   Vitamin B12 deficiency 03/03/2023   Statin myopathy 03/03/2023   Stage 3a chronic kidney disease (CKD) (HCC) 03/03/2023   Bradycardia 12/12/2022   Difficulty sleeping 06/10/2022   Trigger finger, left middle finger 06/01/2022   Diverticular disease of colon 10/13/2021   Epigastric pain 10/13/2021   Gastroesophageal reflux disease 10/13/2021   New onset of headaches after age 48 10/13/2021  Melena 09/15/2021   Gout 06/08/2021   Itching 06/08/2021   Cutaneous skin tags 10/27/2020   Type 2 diabetes with stage 3 chronic kidney disease GFR 30-59 (HCC) 04/29/2020   Idiopathic neuropathy 01/13/2017   Atrial fibrillation (HCC)    Essential hypertension    Non-alcoholic fatty liver disease 03/03/2015   Thyroid  nodule 01/26/2015   CAD (coronary artery disease) 01/26/2015   Obesity 09/05/2010   Hyperlipidemia 04/14/2010    Past Medical History:  Diagnosis Date   Allergy    Anemia    Arrhythmia    Cataract 2017   Chest pain    a. Neg stress tests - 2005, 2008, 2009;  b. 08/2014 Cardiac CT: Ca score 193 (80th%'ile).   Diabetes mellitus without complication (HCC)    Essential hypertension    GERD (gastroesophageal reflux disease)    Hiatal hernia    a. 08/2014 CT chest: large hiatal hernia.   History of DVT (deep vein thrombosis)    Hyperlipidemia    a. statin intolerant;  b. on zetia  and repatha .   Palpitations    a. 02/2015 Event Monitor: NSR w/ rare, short episodes of A Tach, rare  short runs of SVT < 15 beats.   Type 2 diabetes mellitus (HCC) 11/2022    Family History  Problem Relation Age of Onset   Heart disease Mother    Hyperlipidemia Mother    Arrhythmia Mother    Stroke Father    Heart disease Father    Anemia Father    Hyperlipidemia Sister    Hypothyroidism Sister    Cancer Brother        Thyroid    Heart disease Brother    Stroke Brother    Arrhythmia Brother    Diabetes Maternal Aunt    Cancer Paternal Grandmother        Colon   Colon cancer Other        Grandparent   Past Surgical History:  Procedure Laterality Date   ABDOMINAL EXPLORATION SURGERY     APPENDECTOMY  2017   ATRIAL FIBRILLATION ABLATION N/A 10/03/2023   Procedure: ATRIAL FIBRILLATION ABLATION;  Surgeon: Kennyth Chew, MD;  Location: [facility];  Service: Cardiovascular;  Laterality: N/A;   CATARACT EXTRACTION, BILATERAL     EYE SURGERY  2019   LAPAROSCOPIC APPENDECTOMY N/A 12/02/2015   Procedure: APPENDECTOMY LAPAROSCOPIC;  Surgeon: Mitzie DELENA Freund, MD;  Location: [facility] OR;  Service: General;  Laterality: N/A;   ROOT CANAL  2013   ROOT CANAL     VAGINAL HYSTERECTOMY     abdominal hysterectomy   Social History   Social History Narrative   Not on file   Immunization History  Administered Date(s) Administered   Fluad Quad(high Dose 65+) 04/08/2019, 04/20/2020, 02/17/2021, 12/10/2021   Fluad Trivalent(High Dose 65+) 03/03/2023   Hepatitis B, ADULT 03/17/2015, 04/21/2015, 09/15/2015   INFLUENZA, HIGH DOSE SEASONAL PF 01/13/2017, 01/17/2018, 11/30/2023   Influenza,inj,Quad PF,6+ Mos 03/03/2015, 02/17/2016   Influenza-Unspecified 12/10/2021   Moderna Sars-Covid-2 Vaccination 05/09/2019, 06/03/2019, 01/24/2020, 07/13/2020   Pneumococcal Conjugate-13 07/14/2016   Pneumococcal Polysaccharide-23 04/29/2020   Zoster Recombinant(Shingrix) 06/03/2021, 11/08/2021   Zoster, Live 11/13/2012     Objective: Vital Signs: BP (!) 114/58 (BP Location: Left Arm, Patient  Position: Sitting, Cuff Size: Normal)   Pulse (!) 58   Temp 97.6 F (36.4 C)   Resp 16   Ht 5' 5 (1.651 m)   Wt 185 lb 12.8 oz (84.3 kg)   BMI 30.92 kg/m  Physical Exam Eyes:     Conjunctiva/sclera: Conjunctivae normal.  Cardiovascular:     Rate and Rhythm: Normal rate and regular rhythm.  Pulmonary:     Effort: Pulmonary effort is normal.     Breath sounds: Normal breath sounds.  Skin:    General: Skin is warm and dry.  Neurological:     Mental Status: She is alert.  Psychiatric:        Mood and Affect: Mood normal.      Musculoskeletal Exam:  Elbows full ROM no tenderness or swelling Wrists full ROM no tenderness or swelling Right palm with mild dupuytren contracture proximal to 4th MCP Fingers full ROM, right 1st IP joint heberdon's nodes minimal tenderness Knees full ROM no tenderness or swelling, bilateral patellofemoral crepitus Ankles full ROM no tenderness or swelling Negative MTP squeeze tenderness bilateral  Investigation: No additional findings.  Imaging: No results found.  Recent Labs: Lab Results  Component Value Date   WBC 4.0 09/13/2023   HGB 13.9 09/13/2023   PLT 249 09/13/2023   NA 142 09/13/2023   K 4.1 09/13/2023   CL 105 09/13/2023   CO2 18 (L) 09/13/2023   GLUCOSE 73 11/30/2023   BUN 14 09/13/2023   CREATININE 0.78 09/13/2023   BILITOT 0.5 03/03/2023   ALKPHOS 60 03/03/2023   AST 21 03/03/2023   ALT 15 03/03/2023   PROT 7.2 03/03/2023   ALBUMIN 4.3 03/03/2023   CALCIUM 9.5 09/13/2023   GFRAA 67 04/20/2020    Speciality Comments: No specialty comments available.  Procedures:  No procedures performed Allergies: Statins   Assessment / Plan:     Visit Diagnoses: Drug-induced chronic gout of foot without tophus, unspecified laterality  Medication monitoring encounter - Allopurinol  150 mg daily.  Acute idiopathic gout of foot, unspecified laterality - Uric Acid 5.3-1/27/2025  Trigger finger, left middle finger - S/P L  long A1 trigger finger 12/05/2022  Assessment and Plan Osteoarthritis of the right thumb joint Chronic osteoarthritis with significant structural damage and inflammation. Symptoms include swelling and pain, not severe enough for surgical intervention. - Continue Voltaren and Salonpas as needed. - Consider occupational therapy referral if symptoms worsen.  History of gout No flare-ups for six months post allopurinol  reduction. Asymptomatic status suggests potential for discontinuation with low risk of recurrence, could always resume as needed. - Discontinue allopurinol . - Monitor for gout flare-ups. - Consider prednisone  or colchicine  for flare treatment.        Orders: No orders of the defined types were placed in this encounter.  No orders of the defined types were placed in this encounter.    Follow-Up Instructions: No follow-ups on file.   Lonni LELON Ester, MD  Note - This record has been created using [employer].  Chart creation errors have been sought, but may not always  have been located. Such creation errors do not reflect on  the standard of medical care.

## 2023-11-24 ENCOUNTER — Other Ambulatory Visit: Payer: Self-pay | Admitting: Family Medicine

## 2023-11-24 DIAGNOSIS — E611 Iron deficiency: Secondary | ICD-10-CM

## 2023-11-30 ENCOUNTER — Ambulatory Visit: Payer: Self-pay | Admitting: Family Medicine

## 2023-11-30 ENCOUNTER — Ambulatory Visit: Admitting: Family Medicine

## 2023-11-30 ENCOUNTER — Encounter: Payer: Self-pay | Admitting: Family Medicine

## 2023-11-30 VITALS — BP 126/72 | HR 61 | Temp 97.4°F | Ht 65.0 in | Wt 183.4 lb

## 2023-11-30 DIAGNOSIS — I10 Essential (primary) hypertension: Secondary | ICD-10-CM | POA: Diagnosis not present

## 2023-11-30 DIAGNOSIS — I480 Paroxysmal atrial fibrillation: Secondary | ICD-10-CM | POA: Diagnosis not present

## 2023-11-30 DIAGNOSIS — Z23 Encounter for immunization: Secondary | ICD-10-CM

## 2023-11-30 DIAGNOSIS — E782 Mixed hyperlipidemia: Secondary | ICD-10-CM

## 2023-11-30 DIAGNOSIS — N1831 Chronic kidney disease, stage 3a: Secondary | ICD-10-CM | POA: Diagnosis not present

## 2023-11-30 DIAGNOSIS — I2510 Atherosclerotic heart disease of native coronary artery without angina pectoris: Secondary | ICD-10-CM

## 2023-11-30 DIAGNOSIS — Z7984 Long term (current) use of oral hypoglycemic drugs: Secondary | ICD-10-CM

## 2023-11-30 DIAGNOSIS — R61 Generalized hyperhidrosis: Secondary | ICD-10-CM | POA: Diagnosis not present

## 2023-11-30 DIAGNOSIS — E611 Iron deficiency: Secondary | ICD-10-CM

## 2023-11-30 DIAGNOSIS — E1122 Type 2 diabetes mellitus with diabetic chronic kidney disease: Secondary | ICD-10-CM | POA: Diagnosis not present

## 2023-11-30 LAB — GLUCOSE, RANDOM: Glucose, Bld: 73 mg/dL (ref 70–99)

## 2023-11-30 LAB — HEMOGLOBIN A1C: Hgb A1c MFr Bld: 6.2 % (ref 4.6–6.5)

## 2023-11-30 LAB — TSH: TSH: 1.69 u[IU]/mL (ref 0.35–5.50)

## 2023-11-30 LAB — T4, FREE: Free T4: 0.72 ng/dL (ref 0.60–1.60)

## 2023-11-30 MED ORDER — LANCET DEVICE MISC: 1.0000 | Freq: Three times a day (TID) | 0 refills | Status: AC

## 2023-11-30 MED ORDER — BLOOD GLUCOSE TEST VI STRP: 1.0000 | ORAL_STRIP | Freq: Every day | 5 refills | Status: DC | PRN

## 2023-11-30 MED ORDER — BLOOD GLUCOSE MONITORING SUPPL DEVI: 1.0000 | Freq: Three times a day (TID) | 0 refills | Status: DC

## 2023-11-30 MED ORDER — FERROUS SULFATE 325 (65 FE) MG PO TABS: 325.0000 mg | ORAL_TABLET | Freq: Every day | ORAL | 2 refills | Status: DC

## 2023-11-30 MED ORDER — LANCETS MISC. MISC: 1.0000 | Freq: Three times a day (TID) | 0 refills | Status: AC

## 2023-11-30 NOTE — Progress Notes (Signed)
 [facility] [address] [place] [address] Dept: [phone] Dept Fax: [phone]  Chronic Care Office Visit  Subjective:    Patient ID: Dawn Herring, female    DOB: 23-Mar-1946, 78 y.o..   MRN: [id number]  Chief Complaint  Patient presents with   Hypertension    3 month f/u HTN.     History of Present Illness:  Patient is in today for reassessment of chronic medical issues.  Dawn Herring has hypertension. She is managed on losartan  100 mg daily.   Dawn Herring has atrial fibrillation. Since her last visit, she has had an ablation procedure. She notes two brief episodes of a. fib in two months. She is prescribed metoprolol  succinate 25 mg 1/2 tab daily for rate control, but notes cardiology may stop this at her next visit. She also takes intermittent flecainide  50 mg for rhythm control when she flips into a fib. She is on apixaban  5 mg bid for stroke prevention.    Dawn Herring has periodic lower leg edema. She takes furosemide  20 mg daily and uses a potassium supplement.   Dawn Herring has hyperlipidemia. She has not been able to tolerate statins, developing both muscle pain and weakness. She had been on Praluent  for years, but then developed gout. [name] felt this was likely a trigger for this. She is now on ezetimibe  10 mg daily.   Dawn Herring has Type 2 diabetes. She is managed on metformin  500 mg daily.  Past Medical History: Patient Active Problem List   Diagnosis Date Noted   Dupuytren contracture of right hand 06/05/2023   Iron  deficiency 06/02/2023   Normocytic anemia 06/01/2023   Vitamin B12 deficiency 03/03/2023   Statin myopathy 03/03/2023   Stage 3a chronic kidney disease (CKD) (HCC) 03/03/2023   Bradycardia 12/12/2022   Difficulty sleeping 06/10/2022   Trigger finger, left middle finger 06/01/2022   Diverticular disease of colon 10/13/2021   Epigastric pain 10/13/2021   Gastroesophageal reflux disease  10/13/2021   New onset of headaches after age 1 10/13/2021   Melena 09/15/2021   Gout 06/08/2021   Itching 06/08/2021   Cutaneous skin tags 10/27/2020   Type 2 diabetes with stage 3 chronic kidney disease GFR 30-59 (HCC) 04/29/2020   Idiopathic neuropathy 01/13/2017   Atrial fibrillation (HCC)    Essential hypertension    Non-alcoholic fatty liver disease 03/03/2015   Thyroid  nodule 01/26/2015   CAD (coronary artery disease) 01/26/2015   Obesity 09/05/2010   Hyperlipidemia 04/14/2010   Past Surgical History:  Procedure Laterality Date   ABDOMINAL EXPLORATION SURGERY     APPENDECTOMY  2017   ATRIAL FIBRILLATION ABLATION N/A 10/03/2023   Procedure: ATRIAL FIBRILLATION ABLATION;  Surgeon: Kennyth Chew, MD;  Location: [facility] INVASIVE CV LAB;  Service: Cardiovascular;  Laterality: N/A;   CATARACT EXTRACTION, BILATERAL     EYE SURGERY  2019   LAPAROSCOPIC APPENDECTOMY N/A 12/02/2015   Procedure: APPENDECTOMY LAPAROSCOPIC;  Surgeon: Mitzie DELENA Freund, MD;  Location: [facility] OR;  Service: General;  Laterality: N/A;   ROOT CANAL  2013   ROOT CANAL     VAGINAL HYSTERECTOMY     abdominal hysterectomy   Family History  Problem Relation Age of Onset   Heart disease Mother    Hyperlipidemia Mother    Arrhythmia Mother    Stroke Father    Heart disease Father    Anemia Father    Hyperlipidemia Sister    Hypothyroidism Sister    Cancer  Brother        Thyroid    Heart disease Brother    Stroke Brother    Arrhythmia Brother    Diabetes Maternal Aunt    Cancer Paternal Grandmother        Colon   Colon cancer Other        Grandparent   Outpatient Medications Prior to Visit  Medication Sig Dispense Refill   allopurinol  (ZYLOPRIM ) 100 MG tablet Take 1 tablet (100 mg total) by mouth daily. 90 tablet 1   CALCIUM-MAGNESIUM-ZINC PO Take 1 tablet by mouth daily.     cyanocobalamin  (VITAMIN B12) 1000 MCG tablet Take 1,000 mcg by mouth 3 (three) times a week.     ELIQUIS  5 MG TABS tablet TAKE 1  TABLET BY MOUTH TWICE A DAY 180 tablet 1   ezetimibe  (ZETIA ) 10 MG tablet TAKE 1 TABLET BY MOUTH EVERY DAY 90 tablet 3   flecainide  (TAMBOCOR ) 50 MG tablet TAKE 1 TABLET BY MOUTH TWICE A DAY AS NEEDED 180 tablet 3   furosemide  (LASIX ) 20 MG tablet TAKE 1 TABLET BY MOUTH EVERY DAY 90 tablet 3   losartan  (COZAAR ) 100 MG tablet Take 1 tablet (100 mg total) by mouth daily. 90 tablet 2   metFORMIN  (GLUCOPHAGE ) 500 MG tablet TAKE 1 TABLET BY MOUTH 2 TIMES DAILY WITH A MEAL. 180 tablet 3   metoprolol  succinate (TOPROL -XL) 25 MG 24 hr tablet TAKE 1 TABLET BY MOUTH DAILY TAKE WITH OR IMMEDIATELY FOLLOWING A MEAL.     omeprazole  (PRILOSEC) 20 MG capsule TAKE 1 CAPSULE (20 MG TOTAL) BY MOUTH 2 (TWO) TIMES DAILY BEFORE A MEAL. 180 capsule 1   POTASSIUM GLUCONATE PO Take 1 tablet by mouth daily.     ferrous sulfate  325 (65 FE) MG tablet TAKE 1 TAB BY MOUTH DAILY 30 tablet 2   No facility-administered medications prior to visit.   Allergies  Allergen Reactions   Statins     Has terrible reactions; abdominal pain   Objective:   Today's Vitals   11/30/23 1012  BP: 126/72  Pulse: 61  Temp: (!) 97.4 F (36.3 C)  TempSrc: Temporal  SpO2: 96%  Weight: 183 lb 6.4 oz (83.2 kg)  Height: 5' 5 (1.651 m)   Body mass index is 30.52 kg/m.   General: Well developed, well nourished. No acute distress. CV: RRR with a II/VI systolic murmur. Pulses 2+ bilaterally. Psych: Alert and oriented. Normal mood and affect.  Health Maintenance Due  Topic Date Due   Diabetic kidney evaluation - Urine ACR  Never done   DTaP/Tdap/Td (1 - Tdap) Never done     Assessment & Plan:   Problem List Items Addressed This Visit       Cardiovascular and Mediastinum   Essential hypertension (Chronic)   Blood pressure is in good control. Continue losartan  100 mg daily.       Atrial fibrillation (HCC)   s/p ablation with decreased frequency of episodes. Continue metoprolol  succinate 25 mg 1/2 tab daily for rate control,  flecainide  50 mg PRN for rhythm control, and apixaban  5 mg bid for stroke prevention.      CAD (coronary artery disease)   Stable. No angina. Continue metoprolol  succinate 25 mg 1/2 tab daily.        Endocrine   Type 2 diabetes with stage 3 chronic kidney disease GFR 30-59 (HCC) - Primary   In good control. I will reassess an A1c. Continue metformin  500 mg daily.  Relevant Medications   Blood Glucose Monitoring Suppl DEVI   Glucose Blood (BLOOD GLUCOSE TEST STRIPS) STRP   Lancet Device MISC   Lancets Misc. MISC   Other Relevant Orders   Glucose, random (Completed)   Hemoglobin A1c (Completed)     Genitourinary   Stage 3a chronic kidney disease (CKD) (HCC)   Continue focus on blood pressure and glucose control, adequate hydration, and avoidance of nephrotoxic medications.         Other   Hyperlipidemia (Chronic)   Intolerant of statins and unable to take alirocumab . Lipids at high, but options are limited. Continue ezetimibe  10 mg daily.      Diaphoresis   Etiology is unclear. This has been a more recent issue and occurs at night. I suspect this could be due to nocturnal hypoglycemia. I will request a home glucose monitor for her so she can check blood sugars at night when she awakens with these symptoms. I will check thyroid  tests, but anticipate these will be normal.      Relevant Orders   TSH (Completed)   T4, free (Completed)   Iron  deficiency   Anemia has resolved. Continue iron  supplementation for now and reassess iron  levels at her next visit.      Relevant Medications   ferrous sulfate  325 (65 FE) MG tablet   Other Visit Diagnoses       Need for immunization against influenza       Relevant Orders   Flu vaccine HIGH DOSE PF(Fluzone Trivalent) (Completed)       Return in about 3 months (around 02/29/2024) for Reassessment.   Garnette CHRISTELLA Simpler, MD

## 2023-11-30 NOTE — Assessment & Plan Note (Signed)
 Stable. No angina. Continue metoprolol succinate 25 mg 1/2 tab daily.

## 2023-11-30 NOTE — Assessment & Plan Note (Signed)
 In good control. I will reassess an A1c. Continue metformin  500 mg daily.

## 2023-11-30 NOTE — Assessment & Plan Note (Signed)
 Intolerant of statins and unable to take alirocumab. Lipids at high, but options are limited. Continue ezetimibe 10 mg daily.

## 2023-11-30 NOTE — Assessment & Plan Note (Signed)
 Etiology is unclear. This has been a more recent issue and occurs at night. I suspect this could be due to nocturnal hypoglycemia. I will request a home glucose monitor for her so she can check blood sugars at night when she awakens with these symptoms. I will check thyroid  tests, but anticipate these will be normal.

## 2023-11-30 NOTE — Assessment & Plan Note (Signed)
 s/p ablation with decreased frequency of episodes. Continue metoprolol  succinate 25 mg 1/2 tab daily for rate control, flecainide  50 mg PRN for rhythm control, and apixaban  5 mg bid for stroke prevention.

## 2023-11-30 NOTE — Assessment & Plan Note (Signed)
 Blood pressure is in good control. Continue losartan 100 mg daily.

## 2023-11-30 NOTE — Assessment & Plan Note (Signed)
 Continue focus on blood pressure and glucose control, adequate hydration, and avoidance of nephrotoxic medications.

## 2023-11-30 NOTE — Assessment & Plan Note (Signed)
 Anemia has resolved. Continue iron  supplementation for now and reassess iron  levels at her next visit.

## 2023-12-06 ENCOUNTER — Ambulatory Visit: Attending: Internal Medicine | Admitting: Internal Medicine

## 2023-12-06 ENCOUNTER — Encounter: Payer: Self-pay | Admitting: Internal Medicine

## 2023-12-06 VITALS — BP 114/58 | HR 58 | Temp 97.6°F | Resp 16 | Ht 65.0 in | Wt 185.8 lb

## 2023-12-06 DIAGNOSIS — M65332 Trigger finger, left middle finger: Secondary | ICD-10-CM | POA: Insufficient documentation

## 2023-12-06 DIAGNOSIS — Z5181 Encounter for therapeutic drug level monitoring: Secondary | ICD-10-CM | POA: Insufficient documentation

## 2023-12-06 DIAGNOSIS — M10079 Idiopathic gout, unspecified ankle and foot: Secondary | ICD-10-CM | POA: Diagnosis not present

## 2023-12-06 DIAGNOSIS — M1A2790 Drug-induced chronic gout, unspecified ankle and foot, without tophus (tophi): Secondary | ICD-10-CM | POA: Diagnosis not present

## 2023-12-06 NOTE — Patient Instructions (Signed)

## 2023-12-09 ENCOUNTER — Other Ambulatory Visit: Payer: Self-pay | Admitting: Internal Medicine

## 2023-12-09 DIAGNOSIS — M10079 Idiopathic gout, unspecified ankle and foot: Secondary | ICD-10-CM

## 2024-01-02 NOTE — Progress Notes (Unsigned)
 Electrophysiology Clinic Note    Date:  01/03/2024  Patient ID:  Dawn Herring, Dawn Herring Jan 10, 1946, MRN [id number] PCP:  Thedora Garnette HERO, MD  Cardiologist:  Evalene Lunger, MD   Electrophysiologist:  Fonda Kitty, MD  Electrophysiology APP:  Blima Jaimes, NP    Discussed the use of AI scribe software for clinical note transcription with the patient, who gave verbal consent to proceed.   Patient Profile    Chief Complaint: AF ablation follow-up  History of Present Illness: Dawn Herring is a 78 y.o. female with PMH notable for parox Afib, HTN, HLD, T2DM, CKD-3; seen today for Fonda Kitty, MD for routine electrophysiology follow-up s/p AF Ablation.  She is s/p AF ablation w isolation of pulm veins and posterior wall 09/2023 by Dr. Kitty.  I last saw her 10/2023 for routine 1 month post-ablation follow-up where she had one AF episode after drinking black coffee. Took PRN flecainide  and episode resolved. Her watch was alerting her to bradycardia, some low energy.   On follow-up today, she has continued to have AF episodes 1/month. Since ablation, all AF episodes have occurred in the middle of the night. She has check her rhythm using apple watch that confirms AFib, and she takes PRN flecainide  with improvement in rhythm in about 30-45 minutes.   She denies snoring, daytime fatigue. She continues to take eliquis  BID without bleeding concerns.     Arrhythmia/Device History Flecainide  - PRN    ROS:  Please see the history of present illness. All other systems are reviewed and otherwise negative.    Physical Exam    VS:  BP (!) 120/53 (BP Location: Left Arm, Patient Position: Sitting, Cuff Size: Normal)   Pulse (!) 53   Ht 5' 5 (1.651 m)   Wt 186 lb 3.2 oz (84.5 kg)   SpO2 96%   BMI 30.99 kg/m  BMI: Body mass index is 30.99 kg/m.      Wt Readings from Last 3 Encounters:  01/03/24 186 lb 3.2 oz (84.5 kg)  12/06/23 185 lb 12.8 oz (84.3 kg)  11/30/23 183 lb 6.4 oz  (83.2 kg)     GEN- The patient is well appearing, alert and oriented x 3 today.   Lungs- Clear to ausculation bilaterally, normal work of breathing.  Heart- Regular rate and rhythm, no murmurs, rubs or gallops Extremities- No peripheral edema, warm, dry    Studies Reviewed   Previous EP, cardiology notes.    EKG is ordered. Personal review of EKG from today shows:    EKG Interpretation Date/Time:  Wednesday January 03 2024 13:34:34 EDT Ventricular Rate:  53 PR Interval:  208 QRS Duration:  82 QT Interval:  500 QTC Calculation: 469 R Axis:   26  Text Interpretation: Sinus bradycardia with Premature supraventricular complexes Low voltage QRS Confirmed by Sharia Averitt [id number]) on 01/03/2024 1:43:00 PM    Cardiac CT, 09/12/2023 1. There is normal pulmonary vein drainage into the left atrium with ostial measurements above.  2. There is no thrombus in the left atrial appendage. 3. The esophagus runs to the left of the atrial midline and is in proximity to the left pulmonary vein ostia. 4. Small PFO is present. 5. Normal coronary origin. Right dominance. 6. CAC score of 1004 which is 92nd percentile for age-, race-, and sex-matched controls.  Long term monitor, 08/07/2023 Normal sinus rhythm Patient had a min HR of 41 bpm, max HR of 203 bpm, and avg HR of 57 bpm.  110 Supraventricular Tachycardia runs occurred, the run with the fastest interval lasting 13 beats with a max rate of 203 bpm, the longest lasting 22.3 secs with an avg rate of 93 bpm.    Isolated SVEs were occasional (1.9%, 17672), SVE Couplets were rare (<1.0%, 406), and SVE Triplets were rare (<1.0%, 91).    Isolated VEs were rare (<1.0%, 1725), VE Triplets were rare (<1.0%, 2), and no VE Couplets were present. Ventricular Bigeminy and Trigeminy were present.   No patient triggered events recorded    Assessment and Plan     #) parox Afib S/p AF ablation 09/2023 by Dr. Kennyth Improved AF burden since  ablation Continue daily 25mg  toprol  Stop PRN flecainide , ok to take extra    #) Hypercoag d/t parox afib CHA2DS2-VASc Score = at least 5 [CHF History: 0, HTN History: 1, Diabetes History: 1, Stroke History: 0, Vascular Disease History: 0, Age Score: 2, Gender Score: 1].  Therefore, the patient's annual risk of stroke is 7.2 %.    Stroke ppx - 5mg  eliquis  BID, appropriately dosed No bleeding concerns   #) concern for OSA Nocturnal AFib episodes Stop bang = 2 Will refer to pulm for further evaluation      Current medicines are reviewed at length with the patient today.   The patient does not have concerns regarding her medicines.  The following changes were made today:  none  Labs/ tests ordered today include:  Orders Placed This Encounter  Procedures   EKG 12-Lead     Disposition: Follow up with Dr. Kennyth or EP APP in 6 months  Follow-up with gen cards in ~3 months   Signed, Monserrath Junio, NP  01/03/24  2:21 PM  Electrophysiology [facility]

## 2024-01-03 ENCOUNTER — Encounter: Payer: Self-pay | Admitting: Cardiology

## 2024-01-03 ENCOUNTER — Ambulatory Visit: Attending: Cardiology | Admitting: Cardiology

## 2024-01-03 VITALS — BP 120/53 | HR 53 | Ht 65.0 in | Wt 186.2 lb

## 2024-01-03 DIAGNOSIS — D6869 Other thrombophilia: Secondary | ICD-10-CM | POA: Insufficient documentation

## 2024-01-03 DIAGNOSIS — I480 Paroxysmal atrial fibrillation: Secondary | ICD-10-CM | POA: Insufficient documentation

## 2024-01-03 NOTE — Patient Instructions (Signed)
 Medication Instructions:  - STOP flecainide  - take an extra 1/2 metoprolol  as needed for atrial fibrillation  *If you need a refill on your cardiac medications before your next appointment, please call your pharmacy*  Lab Work: No labs ordered today  If you have labs (blood work) drawn today and your tests are completely normal, you will receive your results only by: MyChart Message (if you have MyChart) OR A paper copy in the mail If you have any lab test that is abnormal or we need to change your treatment, we will call you to review the results.  Testing/Procedures: No test ordered today   Follow-Up: At [facility], you and your health needs are our priority.  As part of our continuing mission to provide you with exceptional heart care, our providers are all part of one team.  This team includes your primary Cardiologist (physician) and Advanced Practice Providers or APPs (Physician Assistants and Nurse Practitioners) who all work together to provide you with the care you need, when you need it.  Your next appointment:   6 month(s)  Provider:   Suzann Riddle, NP   We recommend signing up for the patient portal called MyChart.  Sign up information is provided on this After Visit Summary.  MyChart is used to connect with patients for Virtual Visits (Telemedicine).  Patients are able to view lab/test results, encounter notes, upcoming appointments, etc.  Non-urgent messages can be sent to your provider as well.   To learn more about what you can do with MyChart, go to [email or web address].

## 2024-01-09 ENCOUNTER — Encounter: Payer: Self-pay | Admitting: Podiatry

## 2024-01-09 ENCOUNTER — Ambulatory Visit: Admitting: Podiatry

## 2024-01-09 DIAGNOSIS — L600 Ingrowing nail: Secondary | ICD-10-CM

## 2024-01-09 DIAGNOSIS — E1142 Type 2 diabetes mellitus with diabetic polyneuropathy: Secondary | ICD-10-CM

## 2024-01-09 DIAGNOSIS — L603 Nail dystrophy: Secondary | ICD-10-CM

## 2024-01-09 NOTE — Addendum Note (Signed)
 Addended by: LOREDA HACKER on: 01/09/2024 01:39 PM   Modules accepted: Level of Service

## 2024-01-09 NOTE — Progress Notes (Signed)
 This patient returns to my office for at risk foot care.  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  This patient is unable to cut nails herself since the patient cannot reach her nails.These nails are painful walking and wearing shoes. She has had this problem for years and requests nail correction. This patient presents for at risk foot care today.  General Appearance  Alert, conversant and in no acute stress.  Vascular  Dorsalis pedis and posterior tibial  pulses are palpable  bilaterally/diminished .  Capillary return is within normal limits  bilaterally. Temperature is within normal limits  bilaterally.  Neurologic  Senn-Weinstein monofilament wire test within normal limits  bilaterally. Muscle power within normal limits bilaterally.  Nails Thick disfigured discolored nails with subungual debris  hallux nails bilaterally. No evidence of bacterial infection or drainage bilaterally. Ingrown hallux toenails.  Orthopedic  No limitations of motion  feet .  No crepitus or effusions noted.  No bony pathology or digital deformities noted. HAV left foot.  Skin  normotropic skin with no porokeratosis noted bilaterally.  No signs of infections or ulcers noted.     Onychomycosis  Pain in right toes  Pain in left toes  Consent was obtained for treatment procedures.   Mechanical debridement of nails 1-5  bilaterally performed with a nail nipper.  Filed with dremel without incident.  Told her to see Dr.  Tobie for permanent correction of ingrowing hallux toenails.   Return office visit   prn                   Told patient to return for periodic foot care and evaluation due to potential at risk complications.   Cordella Bold DPM

## 2024-01-12 ENCOUNTER — Ambulatory Visit: Admitting: Student

## 2024-01-15 ENCOUNTER — Ambulatory Visit: Admitting: Primary Care

## 2024-01-15 ENCOUNTER — Encounter: Payer: Self-pay | Admitting: Primary Care

## 2024-01-15 VITALS — BP 142/61 | HR 52 | Temp 97.5°F | Ht 65.0 in | Wt 185.4 lb

## 2024-01-15 DIAGNOSIS — G479 Sleep disorder, unspecified: Secondary | ICD-10-CM

## 2024-01-15 DIAGNOSIS — I480 Paroxysmal atrial fibrillation: Secondary | ICD-10-CM | POA: Diagnosis not present

## 2024-01-15 DIAGNOSIS — G4700 Insomnia, unspecified: Secondary | ICD-10-CM | POA: Diagnosis not present

## 2024-01-15 NOTE — Patient Instructions (Addendum)
 VISIT SUMMARY: Today, you were seen for sleep disturbances and your history of atrial fibrillation. We discussed your ongoing issues with falling and staying asleep, as well as your recent episode of atrial fibrillation. We also reviewed your type 2 diabetes management.   YOUR PLAN: -SUSPECTED OBSTRUCTIVE SLEEP APNEA: Obstructive sleep apnea is a condition where your breathing repeatedly stops and starts during sleep. Given your family history and sleep disturbances, we have ordered an in-lab sleep study to evaluate for this condition. In the meantime, try sleeping on your side or using a wedge pillow if you sleep on your back, and avoid alcohol before bedtime.  -INSOMNIA: Insomnia is a condition where you have trouble falling asleep or staying asleep. We will first evaluate for obstructive sleep apnea, as it may be contributing to your insomnia. If sleep apnea is not the cause, we may consider sleep aids to help you sleep better.  -PAROXYSMAL ATRIAL FIBRILLATION, STATUS POST ABLATION: Paroxysmal atrial fibrillation is a type of irregular heartbeat that comes and goes. You had an ablation procedure in July and have had one episode since then. Continue to use metoprolol  as needed for any episodes and monitor for any recurrence of symptoms as directed by your cardiologist.  -TYPE 2 DIABETES MELLITUS WITHOUT COMPLICATIONS: Type 2 diabetes is a condition that affects how your body processes blood sugar. Your recent A1c level is 6.1, which is good. Continue to monitor your blood glucose levels regularly.  INSTRUCTIONS: Please follow up with the in-lab sleep study at [facility] to evaluate for obstructive sleep apnea. Continue to monitor your blood glucose levels and manage any atrial fibrillation episodes with metoprolol  as needed. Avoid alcohol before bedtime and try to sleep on your side or use a wedge pillow if you sleep on your back.  Orders: PSG re: insomnia/ afib  Follow-up Call or send  mychart message after sleep study for results/next steps    Sleep Apnea  Sleep apnea is a condition that affects your breathing while you are sleeping. Your tongue or soft tissue in your throat may block the flow of air while you sleep. You may have shallow breathing or stop breathing for short periods of time. People with sleep apnea may snore loudly. There are three kinds of sleep apnea: Obstructive sleep apnea. This kind is caused by a blocked or collapsed airway. This is the most common. Central sleep apnea. This kind happens when the part of the brain that controls breathing does not send the correct signals to the muscles that control breathing. Mixed sleep apnea. This is a combination of obstructive and central sleep apnea. What are the causes? The most common cause of sleep apnea is a collapsed or blocked airway. What increases the risk? Being very overweight. Having family members with sleep apnea. Having a tongue or tonsils that are larger than normal. Having a small airway or jaw problems. Being older. What are the signs or symptoms? Loud snoring. Restless sleep. Trouble staying asleep. Being sleepy or tired during the day. Waking up gasping or choking. Having a headache in the morning. Mood swings. Having a hard time remembering things and concentrating. How is this diagnosed? A medical history. A physical exam. A sleep study. This is also called a polysomnography test. This test is done at a sleep lab or in your home while you are sleeping. How is this treated? Treatment may include: Sleeping on your side. Losing weight if you're overweight. Wearing an oral appliance. This is a mouthpiece that moves  your lower jaw forward. Using a positive airway pressure (PAP) device to keep your airways open while you sleep, such as: A continuous positive airway pressure (CPAP) device. This device gives forced air through a mask when you breathe out. This keeps your airways  open. A bilevel positive airway pressure (BIPAP) device. This device gives forced air through a mask when you breathe in and when you breathe out to keep your airways open. Having surgery if other treatments do not work. If your sleep apnea is not treated, you may be at risk for: Heart failure. Heart attack. Stroke. Type 2 diabetes or a problem with your blood sugar called insulin resistance. Follow these instructions at home: Medicines Take your medicines only as told by your health care provider. Avoid alcohol, medicines to help you relax, and certain pain medicines. These may make sleep apnea worse. General instructions Do not smoke, vape, or use products with nicotine or tobacco in them. If you need help quitting, talk with your provider. If you were given a PAP device to open your airway while you sleep, use it as told by your provider. If you're having surgery, make sure to tell your provider you have sleep apnea. You may need to bring your PAP device with you. Contact a health care provider if: The PAP device that you were given to use during sleep bothers you or does not seem to be working. You do not feel better or you feel worse. Get help right away if: You have trouble breathing. You have chest pain. You have trouble talking. One side of your body feels weak. A part of your face is hanging down. These symptoms may be an emergency. Call 911 right away. Do not wait to see if the symptoms will go away. Do not drive yourself to the hospital. This information is not intended to replace advice given to you by your health care provider. Make sure you discuss any questions you have with your health care provider. Document Revised: 12/15/2022 Document Reviewed: 05/19/2022 Elsevier Patient Education  2024 [employer].

## 2024-01-15 NOTE — Progress Notes (Signed)
 @Patient  ID: Dawn Herring, female    DOB: Aug 22, 1945, 78 y.o.   MRN: [id number]  Chief Complaint  Patient presents with   Consult    Referred from Cardiology to see if her sleep is effecting AFIB    Referring provider: Riddle, Suzann, NP  HPI: 78 year old female, never smoked. PMH significant for afib, HTN, CAD, GERD, non-alcoholic fatty liver, type 2 diabetes, chronic kidney disease, hyperlipidemia, gout, iron  deficiency, obesity.   01/15/2024 Discussed the use of AI scribe software for clinical note transcription with the patient, who gave verbal consent to proceed.  History of Present Illness Dawn Herring is a 78 year old female with atrial fibrillation who presents with sleep disturbances. She was referred by her cardiologist for evaluation of sleep disturbances.  She has a history of atrial fibrillation and underwent an ablation on July 8th. Since the procedure, she experienced one episode of atrial fibrillation at night, characterized by a sensation of her heart 'coming out' and pain between her shoulder blades. She managed this episode with two Tylenol  and Flecainide , which typically resolves her symptoms within an hour.  She has been experiencing sleep disturbances for years, characterized by difficulty falling asleep, sometimes taking hours to do so, and waking up during the night unable to return to sleep. No snoring or waking up gasping or choking. She does not experience daytime sleepiness unless she reads for hours and does not take naps. She typically goes to bed between 10:30 and 11:00 PM but sometimes does not fall asleep until 2:00 or 3:00 AM.  Her family history is significant for sleep apnea, with her brother, nephew, and cousins having the condition. She does not believe she exhibits many symptoms of sleep apnea but acknowledges the family history.  She has been diagnosed with type 2 diabetes for a year, with an A1c of 6.1 as of September. Her blood sugar levels  have been monitored, with readings typically around 140 to 160 mg/dL.  No concern for narcolepsy or cataplexy. Epworth score 1/24.    Allergies  Allergen Reactions   Statins     Has terrible reactions; abdominal pain    Immunization History  Administered Date(s) Administered   Fluad Quad(high Dose 65+) 04/08/2019, 04/20/2020, 02/17/2021, 12/10/2021   Fluad Trivalent(High Dose 65+) 03/03/2023   Hepatitis B, ADULT 03/17/2015, 04/21/2015, 09/15/2015   INFLUENZA, HIGH DOSE SEASONAL PF 01/13/2017, 01/17/2018, 11/30/2023   Influenza,inj,Quad PF,6+ Mos 03/03/2015, 02/17/2016   Influenza-Unspecified 12/10/2021   Moderna Sars-Covid-2 Vaccination 05/09/2019, 06/03/2019, 01/24/2020, 07/13/2020   Pneumococcal Conjugate-13 07/14/2016   Pneumococcal Polysaccharide-23 04/29/2020   Zoster Recombinant(Shingrix) 06/03/2021, 11/08/2021   Zoster, Live 11/13/2012    Past Medical History:  Diagnosis Date   Allergy    Anemia    Arrhythmia    Cataract 2017   Chest pain    a. Neg stress tests - 2005, 2008, 2009;  b. 08/2014 Cardiac CT: Ca score 193 (80th%'ile).   Diabetes mellitus without complication (HCC)    Essential hypertension    GERD (gastroesophageal reflux disease)    Hiatal hernia    a. 08/2014 CT chest: large hiatal hernia.   History of DVT (deep vein thrombosis)    Hyperlipidemia    a. statin intolerant;  b. on zetia  and repatha .   Palpitations    a. 02/2015 Event Monitor: NSR w/ rare, short episodes of A Tach, rare short runs of SVT < 15 beats.   Type 2 diabetes mellitus (HCC) 11/2022    Tobacco History:  Social History   Tobacco Use  Smoking Status Never   Passive exposure: Never  Smokeless Tobacco Never   Counseling given: Not Answered   Outpatient Medications Prior to Visit  Medication Sig Dispense Refill   CALCIUM-MAGNESIUM-ZINC PO Take 1 tablet by mouth daily.     cyanocobalamin  (VITAMIN B12) 1000 MCG tablet Take 1,000 mcg by mouth 3 (three) times a week.      ELIQUIS  5 MG TABS tablet TAKE 1 TABLET BY MOUTH TWICE A DAY 180 tablet 1   ezetimibe  (ZETIA ) 10 MG tablet TAKE 1 TABLET BY MOUTH EVERY DAY 90 tablet 3   ferrous sulfate  325 (65 FE) MG tablet Take 1 tablet (325 mg total) by mouth daily. 30 tablet 2   furosemide  (LASIX ) 20 MG tablet TAKE 1 TABLET BY MOUTH EVERY DAY 90 tablet 3   losartan  (COZAAR ) 100 MG tablet Take 1 tablet (100 mg total) by mouth daily. 90 tablet 2   metFORMIN  (GLUCOPHAGE ) 500 MG tablet TAKE 1 TABLET BY MOUTH 2 TIMES DAILY WITH A MEAL. 180 tablet 3   metoprolol  succinate (TOPROL -XL) 25 MG 24 hr tablet TAKE 1 TABLET BY MOUTH DAILY TAKE WITH OR IMMEDIATELY FOLLOWING A MEAL.     omeprazole  (PRILOSEC) 20 MG capsule TAKE 1 CAPSULE (20 MG TOTAL) BY MOUTH 2 (TWO) TIMES DAILY BEFORE A MEAL. 180 capsule 1   POTASSIUM GLUCONATE PO Take 1 tablet by mouth daily.     Accu-Chek Softclix Lancets lancets SMARTSIG:Topical     Blood Glucose Monitoring Suppl DEVI 1 each by Does not apply route in the morning, at noon, and at bedtime. May substitute to any manufacturer covered by patient's insurance. 1 each 0   Glucose Blood (BLOOD GLUCOSE TEST STRIPS) STRP 1 each by In Vitro route daily as needed. May substitute to any manufacturer covered by patient's insurance. 50 strip 5   No facility-administered medications prior to visit.   Review of Systems  Review of Systems  Constitutional: Negative.   HENT: Negative.    Respiratory: Negative.    Cardiovascular: Negative.    Physical Exam  BP (!) 142/61   Pulse (!) 52   Temp (!) 97.5 F (36.4 C)   Ht 5' 5 (1.651 m)   Wt 185 lb 6.4 oz (84.1 kg)   SpO2 96% Comment: ra  BMI 30.85 kg/m  Physical Exam Constitutional:      General: She is not in acute distress.    Appearance: Normal appearance. She is not ill-appearing.  HENT:     Head: Normocephalic and atraumatic.     Mouth/Throat:     Mouth: Mucous membranes are moist.     Pharynx: Oropharynx is clear.  Cardiovascular:     Rate and  Rhythm: Normal rate and regular rhythm.  Pulmonary:     Effort: Pulmonary effort is normal.     Breath sounds: Normal breath sounds.  Musculoskeletal:        General: Normal range of motion.  Skin:    General: Skin is warm and dry.  Neurological:     General: No focal deficit present.     Mental Status: She is alert and oriented to person, place, and time. Mental status is at baseline.  Psychiatric:        Mood and Affect: Mood normal.        Behavior: Behavior normal.        Thought Content: Thought content normal.        Judgment: Judgment normal.  Lab Results:  CBC    Component Value Date/Time   WBC 4.0 09/13/2023 1138   WBC 4.5 09/01/2023 1041   RBC 4.29 09/13/2023 1138   RBC 4.46 09/01/2023 1041   HGB 13.9 09/13/2023 1138   HCT 41.5 09/13/2023 1138   PLT 249 09/13/2023 1138   MCV 97 09/13/2023 1138   MCH 32.4 09/13/2023 1138   MCH 28.3 04/24/2023 1337   MCHC 33.5 09/13/2023 1138   MCHC 33.6 09/01/2023 1041   RDW 14.7 09/13/2023 1138   LYMPHSABS 1.5 06/10/2022 1048   MONOABS 0.4 06/10/2022 1048   EOSABS 80 04/24/2023 1337   BASOSABS 32 04/24/2023 1337    BMET    Component Value Date/Time   NA 142 09/13/2023 1138   K 4.1 09/13/2023 1138   CL 105 09/13/2023 1138   CO2 18 (L) 09/13/2023 1138   GLUCOSE 73 11/30/2023 1106   BUN 14 09/13/2023 1138   CREATININE 0.78 09/13/2023 1138   CREATININE 0.98 03/02/2022 1359   CALCIUM 9.5 09/13/2023 1138   GFRNONAA 58 (L) 04/20/2020 1146   GFRAA 67 04/20/2020 1146    BNP No results found for: BNP  ProBNP    Component Value Date/Time   PROBNP 321.0 (H) 10/13/2021 1227    Imaging: No results found.   Assessment & Plan:   1. Paroxysmal atrial fibrillation (HCC) (Primary) - PSG SLEEP STUDY; Future  2. Sleep disturbances - PSG SLEEP STUDY; Future  3. Insomnia, unspecified type - PSG SLEEP STUDY; Future  Assessment & Plan Suspected obstructive sleep apnea Due to family history, sleep  disturbances, and paroxysmal atrial fibrillation. Symptoms include difficulty falling asleep and waking up with rapid heart rate. No loud snoring or gasping reported. Untreated sleep apnea can increase risk for atrial fibrillation, stroke, pulmonary hypertension, diabetes, and potentially Alzheimer's disease. - Ordered in-lab polysomnography at [facility] to evaluate for obstructive sleep apnea. - Advised side sleeping position or use of a wedge pillow if a back sleeper. - Advised to avoid alcohol consumption before bedtime.  Insomnia Chronic insomnia with difficulty falling asleep and maintaining sleep. Sleep onset insomnia with sleep onset taking over an hour. Sleep maintenance insomnia with frequent awakenings. Potential link to suspected obstructive sleep apnea. - Will evaluate for obstructive sleep apnea with polysomnography. - Will consider sleep aids if insomnia is not related to sleep apnea.  Paroxysmal atrial fibrillation, status post ablation Paroxysmal atrial fibrillation with ablation performed on July 8th. Currently out of AFib with one episode post-ablation, managed with Tylenol  and Flecainide . Cardiologist advised discontinuation of flecainide  and use of metoprolol  for episodes. Recent EKG in October shows sinus bradycardia. Potential link between sleep apnea and AFib recurrence discussed. - Continue metoprolol  and Eliquis  as directed - Monitor for AFib recurrence and manage symptoms as directed by cardiologist.  Type 2 diabetes mellitus without complications Type 2 diabetes mellitus diagnosed a year ago. Recent A1c is 6.1. Blood glucose levels range from 140 to 160 mg/dL. No current complications reported. Potential link between sleep apnea and diabetes discussed. - Continue monitoring blood glucose levels. - Maintain normal BMI range    Almarie LELON Ferrari, NP 01/15/2024

## 2024-02-02 ENCOUNTER — Other Ambulatory Visit: Payer: Self-pay | Admitting: Cardiovascular Disease

## 2024-02-02 DIAGNOSIS — I10 Essential (primary) hypertension: Secondary | ICD-10-CM

## 2024-02-08 ENCOUNTER — Other Ambulatory Visit: Payer: Self-pay | Admitting: Family Medicine

## 2024-02-24 ENCOUNTER — Other Ambulatory Visit: Payer: Self-pay | Admitting: Family Medicine

## 2024-02-24 DIAGNOSIS — E611 Iron deficiency: Secondary | ICD-10-CM

## 2024-02-29 ENCOUNTER — Ambulatory Visit: Admitting: Family Medicine

## 2024-02-29 VITALS — BP 124/72 | HR 55 | Temp 97.3°F | Ht 65.0 in | Wt 185.4 lb

## 2024-02-29 DIAGNOSIS — I480 Paroxysmal atrial fibrillation: Secondary | ICD-10-CM

## 2024-02-29 DIAGNOSIS — I2510 Atherosclerotic heart disease of native coronary artery without angina pectoris: Secondary | ICD-10-CM | POA: Diagnosis not present

## 2024-02-29 DIAGNOSIS — E1122 Type 2 diabetes mellitus with diabetic chronic kidney disease: Secondary | ICD-10-CM

## 2024-02-29 DIAGNOSIS — E782 Mixed hyperlipidemia: Secondary | ICD-10-CM

## 2024-02-29 DIAGNOSIS — I10 Essential (primary) hypertension: Secondary | ICD-10-CM | POA: Diagnosis not present

## 2024-02-29 DIAGNOSIS — N1831 Chronic kidney disease, stage 3a: Secondary | ICD-10-CM

## 2024-02-29 DIAGNOSIS — Z7984 Long term (current) use of oral hypoglycemic drugs: Secondary | ICD-10-CM

## 2024-02-29 DIAGNOSIS — E611 Iron deficiency: Secondary | ICD-10-CM

## 2024-02-29 LAB — LIPID PANEL
Cholesterol: 256 mg/dL — ABNORMAL HIGH (ref 0–200)
HDL: 53.6 mg/dL (ref >39.00)
LDL Cholesterol: 151 mg/dL — ABNORMAL HIGH (ref 0–99)
NonHDL: 202.06
Total CHOL/HDL Ratio: 5
Triglycerides: 254 mg/dL — ABNORMAL HIGH (ref 0.0–149.0)
VLDL: 50.8 mg/dL — ABNORMAL HIGH (ref 0.0–40.0)

## 2024-02-29 LAB — BASIC METABOLIC PANEL WITH GFR
BUN: 18 mg/dL (ref 6–23)
CO2: 26 meq/L (ref 19–32)
Calcium: 9.6 mg/dL (ref 8.4–10.5)
Chloride: 104 meq/L (ref 96–112)
Creatinine, Ser: 0.91 mg/dL (ref 0.40–1.20)
GFR: 60.23 mL/min (ref >60.00)
Glucose, Bld: 68 mg/dL — ABNORMAL LOW (ref 70–99)
Potassium: 3.4 meq/L — ABNORMAL LOW (ref 3.5–5.1)
Sodium: 140 meq/L (ref 135–145)

## 2024-02-29 LAB — URINALYSIS, ROUTINE W REFLEX MICROSCOPIC
Bilirubin Urine: NEGATIVE
Hgb urine dipstick: NEGATIVE
Ketones, ur: NEGATIVE
Leukocytes,Ua: NEGATIVE
Nitrite: NEGATIVE
RBC / HPF: NONE SEEN (ref 0–?)
Specific Gravity, Urine: 1.015 (ref 1.000–1.030)
Total Protein, Urine: NEGATIVE
Urine Glucose: NEGATIVE
Urobilinogen, UA: 0.2 (ref 0.0–1.0)
pH: 6 (ref 5.0–8.0)

## 2024-02-29 LAB — CBC
HCT: 40.5 % (ref 36.0–46.0)
Hemoglobin: 13.9 g/dL (ref 12.0–15.0)
MCHC: 34.3 g/dL (ref 30.0–36.0)
MCV: 95.9 fl (ref 78.0–100.0)
Platelets: 232 K/uL (ref 150.0–400.0)
RBC: 4.23 Mil/uL (ref 3.87–5.11)
RDW: 12.9 % (ref 11.5–15.5)
WBC: 4.6 K/uL (ref 4.0–10.5)

## 2024-02-29 LAB — MICROALBUMIN / CREATININE URINE RATIO
Creatinine,U: 32.7 mg/dL
Microalb Creat Ratio: UNDETERMINED mg/g (ref 0.0–30.0)
Microalb, Ur: <0.7 mg/dL

## 2024-02-29 LAB — HEMOGLOBIN A1C: Hgb A1c MFr Bld: 6 % (ref 4.6–6.5)

## 2024-02-29 NOTE — Assessment & Plan Note (Addendum)
 Continue focus on blood pressure and glucose control, adequate hydration, and avoidance of nephrotoxic medications. On ARB.

## 2024-02-29 NOTE — Assessment & Plan Note (Signed)
 Blood pressure is in good control. Continue losartan 100 mg daily.

## 2024-02-29 NOTE — Assessment & Plan Note (Signed)
 Stable. No angina. Continue metoprolol succinate 25 mg 1/2 tab daily.

## 2024-02-29 NOTE — Assessment & Plan Note (Addendum)
 I will reassess iron  levels and CBC today.

## 2024-02-29 NOTE — Assessment & Plan Note (Addendum)
 A1c has been in good control. Continue metformin  500 mg daily. I will check annual DM labs today.

## 2024-02-29 NOTE — Progress Notes (Signed)
 [facility] [place] [facility] [address] [place] [address] Dept: [phone] Dept Fax: [phone]  Chronic Care Office Visit  Subjective:    Patient ID: Dawn Herring, female    DOB: [birth year], 78 y.o..   MRN: [id number]  Chief Complaint  Patient presents with   Diabetes    3 month f/u  DM.   No fasting today.    History of Present Illness:  Patient is in today for reassessment of chronic medical conditions.   [name] has hypertension. She is managed on losartan  100 mg daily.   [name] has atrial fibrillation. She is prescribed metoprolol  succinate 25 mg 1/2 tab daily for rate control. She is on apixaban  5 mg bid for stroke prevention. She notes her cardiology PA was concerned about possibel sleep apnea. Although she has a strong family history of this, [name] doubts that she has this condition.   [name] has periodic lower leg edema. She takes furosemide  20 mg daily and uses a potassium supplement.   [name] has hyperlipidemia. She has not been able to tolerate statins, developing both muscle pain and weakness. She had been on Praluent  for years, but then developed gout. [name] felt this was likely a trigger for this. She is now on ezetimibe  10 mg daily.   [name] has Type 2 diabetes. She is managed on metformin  500 mg daily.  Past Medical History: Patient Active Problem List   Diagnosis Date Noted   Diaphoresis 11/30/2023   Dupuytren contracture of right hand 06/05/2023   Iron  deficiency 06/02/2023   Normocytic anemia 06/01/2023   Vitamin B12 deficiency 03/03/2023   Statin myopathy 03/03/2023   Stage 3a chronic kidney disease (CKD) (HCC) 03/03/2023   Bradycardia 12/12/2022   Difficulty sleeping 06/10/2022   Trigger finger, left middle finger 06/01/2022   Diverticular disease of colon 10/13/2021   Epigastric pain 10/13/2021   Gastroesophageal reflux disease 10/13/2021   New onset of headaches after age  74 10/13/2021   Melena 09/15/2021   Gout 06/08/2021   Itching 06/08/2021   Cutaneous skin tags 10/27/2020   Type 2 diabetes with stage 3 chronic kidney disease GFR 30-59 (HCC) 04/29/2020   Idiopathic neuropathy 01/13/2017   Atrial fibrillation (HCC)    Essential hypertension    Non-alcoholic fatty liver disease 03/03/2015   Thyroid  nodule 01/26/2015   CAD (coronary artery disease) 01/26/2015   Obesity 09/05/2010   Hyperlipidemia 04/14/2010   Past Surgical History:  Procedure Laterality Date   ABDOMINAL EXPLORATION SURGERY     APPENDECTOMY  2017   ATRIAL FIBRILLATION ABLATION N/A 10/03/2023   Procedure: ATRIAL FIBRILLATION ABLATION;  Surgeon: Kennyth Chew, MD;  Location: [facility] INVASIVE CV LAB;  Service: Cardiovascular;  Laterality: N/A;   CATARACT EXTRACTION, BILATERAL     EYE SURGERY  2019   LAPAROSCOPIC APPENDECTOMY N/A 12/02/2015   Procedure: APPENDECTOMY LAPAROSCOPIC;  Surgeon: Mitzie DELENA Freund, MD;  Location: [facility] OR;  Service: General;  Laterality: N/A;   ROOT CANAL  2013   ROOT CANAL     VAGINAL HYSTERECTOMY     abdominal hysterectomy   Family History  Problem Relation Age of Onset   Heart disease Mother    Hyperlipidemia Mother    Arrhythmia Mother    Stroke Father    Heart disease Father    Anemia Father    Hyperlipidemia Sister    Hypothyroidism Sister    Cancer Brother        Thyroid   Heart disease Brother    Stroke Brother    Arrhythmia Brother    Diabetes Maternal Aunt    Cancer Paternal Grandmother        Colon   Colon cancer Other        Grandparent   Outpatient Medications Prior to Visit  Medication Sig Dispense Refill   Accu-Chek Softclix Lancets lancets SMARTSIG:Topical     Blood Glucose Monitoring Suppl DEVI 1 each by Does not apply route in the morning, at noon, and at bedtime. May substitute to any manufacturer covered by patient's insurance. 1 each 0   CALCIUM-MAGNESIUM-ZINC PO Take 1 tablet by mouth daily.     cyanocobalamin  (VITAMIN B12) 1000  MCG tablet Take 1,000 mcg by mouth 3 (three) times a week.     ELIQUIS  5 MG TABS tablet TAKE 1 TABLET BY MOUTH TWICE A DAY 180 tablet 1   ezetimibe  (ZETIA ) 10 MG tablet TAKE 1 TABLET BY MOUTH EVERY DAY 90 tablet 3   ferrous sulfate  325 (65 FE) MG tablet TAKE 1 TABLET BY MOUTH EVERY DAY 90 tablet 0   furosemide  (LASIX ) 20 MG tablet TAKE 1 TABLET BY MOUTH EVERY DAY 90 tablet 3   Glucose Blood (BLOOD GLUCOSE TEST STRIPS) STRP 1 each by In Vitro route daily as needed. May substitute to any manufacturer covered by patient's insurance. 50 strip 5   losartan  (COZAAR ) 100 MG tablet TAKE 1 TABLET BY MOUTH EVERY DAY 90 tablet 0   metFORMIN  (GLUCOPHAGE ) 500 MG tablet TAKE 1 TABLET BY MOUTH TWICE A DAY WITH FOOD 180 tablet 3   metoprolol  succinate (TOPROL -XL) 25 MG 24 hr tablet TAKE 1 TABLET BY MOUTH DAILY TAKE WITH OR IMMEDIATELY FOLLOWING A MEAL.     omeprazole  (PRILOSEC) 20 MG capsule TAKE 1 CAPSULE (20 MG TOTAL) BY MOUTH 2 (TWO) TIMES DAILY BEFORE A MEAL. 180 capsule 1   POTASSIUM GLUCONATE PO Take 1 tablet by mouth daily.     No facility-administered medications prior to visit.   Allergies  Allergen Reactions   Statins     Has terrible reactions; abdominal pain     Objective:   Today's Vitals   02/29/24 1010  BP: 124/72  Pulse: (!) 55  Temp: (!) 97.3 F (36.3 C)  TempSrc: Temporal  SpO2: 96%  Weight: 185 lb 6.4 oz (84.1 kg)  Height: 5' 5 (1.651 m)   Body mass index is 30.85 kg/m.   General: Well developed, well nourished. No acute distress. Psych: Alert and oriented. Normal mood and affect.  Health Maintenance Due  Topic Date Due   Diabetic kidney evaluation - Urine ACR  Never done   DTaP/Tdap/Td (1 - Tdap) Never done   Lab Results Lab Results  Component Value Date   HGBA1C 6.2 11/30/2023   Lipid Panel:  Lab Results  Component Value Date   CHOL 258 (H) 03/03/2023   HDL 47.30 03/03/2023   LDLCALC 153 (H) 03/03/2023   TRIG 290.0 (H) 03/03/2023      Latest Ref Rng &  Units 09/13/2023   11:38 AM 09/01/2023   10:41 AM 06/01/2023   11:11 AM  CBC  WBC 3.4 - 10.8 x10E3/uL 4.0  4.5  6.0   Hemoglobin 11.1 - 15.9 g/dL 86.0  85.8  88.9   Hematocrit 34.0 - 46.6 % 41.5  41.9  34.5   Platelets 150 - 450 x10E3/uL 249  243.0  282.0    Iron /TIBC/Ferritin/ %Sat    Component Value Date/Time   IRON  31 (  L) 06/01/2023 1111   TIBC 437 06/01/2023 1111   FERRITIN 8 (L) 06/01/2023 1111   IRONPCTSAT 7 (L) 06/01/2023 1111    Assessment & Plan:   Problem List Items Addressed This Visit       Cardiovascular and Mediastinum   Essential hypertension (Chronic)   Blood pressure is in good control. Continue losartan  100 mg daily.       Atrial fibrillation (HCC)   s/p ablation with decreased frequency of episodes. Continue metoprolol  succinate 25 mg 1/2 tab daily for rate control and apixaban  5 mg bid for stroke prevention.      CAD (coronary artery disease)   Stable. No angina. Continue metoprolol  succinate 25 mg 1/2 tab daily.        Endocrine   Type 2 diabetes with stage 3 chronic kidney disease GFR 30-59 (HCC) - Primary   A1c has been in good control. Continue metformin  500 mg daily. I will check annual DM labs today.      Relevant Orders   Lipid panel   Microalbumin / creatinine urine ratio   Basic metabolic panel with GFR   Hemoglobin A1c   Urinalysis, Routine w reflex microscopic     Genitourinary   Stage 3a chronic kidney disease (CKD) (HCC)   Continue focus on blood pressure and glucose control, adequate hydration, and avoidance of nephrotoxic medications. On ARB.         Other   Hyperlipidemia (Chronic)   Intolerant of statins and unable to take alirocumab . Lipids are high, but options are limited. Continue ezetimibe  10 mg daily.      Relevant Orders   Lipid panel   Iron  deficiency   I will reassess iron  levels and CBC today.      Relevant Orders   CBC   Iron , TIBC and Ferritin Panel    Return in about 3 months (around 05/29/2024) for  Reassessment.   Garnette CHRISTELLA Simpler, MD  I,Emily Lagle,acting as a scribe for Garnette CHRISTELLA Simpler, MD.,have documented all relevant documentation on the behalf of Garnette CHRISTELLA Simpler, MD.  I, Garnette CHRISTELLA Simpler, MD, have reviewed all documentation for this visit. The documentation on 02/29/2024 for the exam, diagnosis, procedures, and orders are all accurate and complete.

## 2024-02-29 NOTE — Assessment & Plan Note (Addendum)
 s/p ablation with decreased frequency of episodes. Continue metoprolol  succinate 25 mg 1/2 tab daily for rate control and apixaban  5 mg bid for stroke prevention.

## 2024-02-29 NOTE — Assessment & Plan Note (Addendum)
 Intolerant of statins and unable to take alirocumab . Lipids are high, but options are limited. Continue ezetimibe  10 mg daily.

## 2024-03-01 ENCOUNTER — Ambulatory Visit: Payer: Self-pay | Admitting: Family Medicine

## 2024-03-01 DIAGNOSIS — K921 Melena: Secondary | ICD-10-CM

## 2024-03-01 LAB — IRON,TIBC AND FERRITIN PANEL
%SAT: 27 % (ref 16–45)
Ferritin: 41 ng/mL (ref 16–288)
Iron: 88 ug/dL (ref 45–160)
TIBC: 322 ug/dL (ref 250–450)

## 2024-03-22 ENCOUNTER — Ambulatory Visit (HOSPITAL_BASED_OUTPATIENT_CLINIC_OR_DEPARTMENT_OTHER): Attending: Primary Care | Admitting: Pulmonary Disease

## 2024-03-22 VITALS — Ht 65.0 in | Wt 185.0 lb

## 2024-03-22 DIAGNOSIS — G4733 Obstructive sleep apnea (adult) (pediatric): Secondary | ICD-10-CM | POA: Insufficient documentation

## 2024-03-22 DIAGNOSIS — G4734 Idiopathic sleep related nonobstructive alveolar hypoventilation: Secondary | ICD-10-CM | POA: Insufficient documentation

## 2024-03-22 DIAGNOSIS — G4731 Primary central sleep apnea: Secondary | ICD-10-CM | POA: Diagnosis not present

## 2024-03-22 DIAGNOSIS — G4761 Periodic limb movement disorder: Secondary | ICD-10-CM | POA: Insufficient documentation

## 2024-03-22 DIAGNOSIS — G4700 Insomnia, unspecified: Secondary | ICD-10-CM | POA: Diagnosis present

## 2024-03-22 DIAGNOSIS — I480 Paroxysmal atrial fibrillation: Secondary | ICD-10-CM | POA: Diagnosis present

## 2024-03-22 DIAGNOSIS — G479 Sleep disorder, unspecified: Secondary | ICD-10-CM

## 2024-03-25 MED ORDER — OMEPRAZOLE 20 MG PO CPDR: 20.0000 mg | DELAYED_RELEASE_CAPSULE | Freq: Two times a day (BID) | ORAL | 1 refills | Status: AC

## 2024-03-26 ENCOUNTER — Ambulatory Visit: Payer: Self-pay | Admitting: Primary Care

## 2024-03-26 DIAGNOSIS — G4733 Obstructive sleep apnea (adult) (pediatric): Secondary | ICD-10-CM

## 2024-03-26 DIAGNOSIS — G4700 Insomnia, unspecified: Secondary | ICD-10-CM

## 2024-03-26 DIAGNOSIS — R0902 Hypoxemia: Secondary | ICD-10-CM

## 2024-03-26 DIAGNOSIS — G4731 Primary central sleep apnea: Secondary | ICD-10-CM | POA: Diagnosis not present

## 2024-03-26 DIAGNOSIS — G479 Sleep disorder, unspecified: Secondary | ICD-10-CM

## 2024-03-26 NOTE — Progress Notes (Signed)
 Please let patient know sleep study showed mild central sleep apnea, periodic limb movement and drops in oxygen level  Need CPAP TITRATION - please order and schedule follow-up in 3 months

## 2024-03-26 NOTE — Procedures (Signed)
 [facility] [address] [place], [place] [address] Tel: [phone]   Fax: [phone]  Polysomnography Interpretation  Patient Name:  Dawn Herring, Dawn Herring Date:  03/22/2024 Referring Physician:  ALMARIE FERRARI [phone]) %%startinterp%% Indications for Polysomnography The patient is a 78 year-old Female who is 5' 5 and weighs 185.0 lbs. Her BMI equals 30.8.  A full night polysomnogram was performed to evaluate for OSA & insomnia.  Polysomnogram Data A full night polysomnogram recorded the standard physiologic parameters including EEG, EOG, EMG, EKG, nasal and oral airflow.  Respiratory parameters of chest and abdominal movements were recorded with Respiratory Inductance Plethysmography belts.  Oxygen saturation was recorded by pulse oximetry.   Sleep Architecture The total recording time of the polysomnogram was 391.9 minutes.  The total sleep time was 296.5 minutes.  The patient spent 33.9% of total sleep time in Stage N1, 58.0% in Stage N2, 1.0% in Stages N3, and 7.1% in REM.  Sleep latency was 10.9 minutes.  REM latency was 232.0 minutes.  Sleep Efficiency was 75.7%.  Wake after Sleep Onset time was 84.0 minutes.  Respiratory Events The polysomnogram revealed a presence of - obstructive, 33 central, and 5 mixed apneas resulting in an Apnea index of 7.7 events per hour.  There were 56 hypopneas (>=3% desaturation and/or arousal) resulting in an Apnea\Hypopnea Index (AHI >=3% desaturation and/or arousal) of 19.0 events per hour.  There were 54 hypopneas (>=4% desaturation) resulting in an Apnea\Hypopnea Index (AHI >=4% desaturation) of 18.6 events per hour.  There were 8 Respiratory Effort Related Arousals resulting in a RERA index of 1.6 events per hour. The Respiratory Disturbance Index is 20.6 events per hour.  The snore index was - events per hour.  Mean oxygen saturation was 90.8%.  The lowest oxygen saturation during sleep was 82.0%.  Time spent  <=88% oxygen saturation was 47.0 minutes (12.0%).  Limb Activity There were 488 total limb movements recorded, of this total, 480 were classified as PLMs.  PLM index was 97.1 per hour and PLM associated with Arousals index was 29.1 per hour.  Cardiac Summary The average pulse rate was 53.7 bpm.  The minimum pulse rate was 46.0 bpm while the maximum pulse rate was 83.0 bpm.  Cardiac rhythm was normal/abnormal.  Comments: She had prolonged sleep latency, prolonged REM latency and significantly fragmented sleep.  The patient slept on both sides and supine.  Frequent limb movements were observed throughout the study, many contributed to arousals from sleep.  Snoring was rare and soft.  The patient showed moderate, mixed sleep apnea.  Some central apneas noted as well as what appeared to be central hypopneas - near the end of the study, a pattern possible Cheyne-stokes respirations was observable.  There was also notable hypoxemia which appeared to be unrelated to respiratory events at times. ECG demonstrated frequent PVCs and some atrial fibrillation  Diagnosis: Moderate OSA Mild central sleep apnea Significant PLMs Nocturnal hypoxia with clinically significant hypoxic burden  Recommendations: CPAP titration study to see if Hypoxia & centrals are corrected by PAP therapy CO-relate with history for restless legs syndrome & consider treatment if symptomatic   This study was personally reviewed and electronically signed by: Jude Donning Accredited Board Certified in Sleep Medicine  03/26/24

## 2024-03-26 NOTE — Progress Notes (Signed)
 I called and spoke to Dawn Herring. Dawn Herring informed of Beth's note and verbalized understanding. CPAP titration has been ordered and the 3 month f/u appt has been scheduled. NFN

## 2024-03-28 ENCOUNTER — Other Ambulatory Visit: Payer: Self-pay | Admitting: Cardiovascular Disease

## 2024-04-02 NOTE — Progress Notes (Signed)
 "  Cardiology Clinic Note   Date: 04/04/2024 ID: Dawn Herring, DOB [birth year], MRN [id number]  [facility] Providers Cardiologist:  Evalene Lunger, MD Electrophysiologist:  Fonda Kitty, MD  Electrophysiology APP:  Riddle, Suzann, NP     Chief Complaint   Dawn Herring is a 79 y.o. female who presents to the clinic today for routine follow up.   Patient Profile   Dawn Herring is followed by Dr. Gollan and Dr. Kitty for the history outlined below.      Past medical history significant for: PAF/atrial tachycardia. 14-day ZIO 08/07/2023: HR 41 to 203 bpm, average 57 bpm.  110 runs of SVT fastest 13 beats max rate 203 bpm, longest 22.3 seconds average rate 93 bpm.  Occasional PACs 1.9%.  Rare PVCs. A-fib ablation 10/03/2023. Hypertension. Hyperlipidemia. CT cardiac scoring 08/29/2014: Coronary calcium score of 193 (80th percentile). Lipid panel 02/29/2024: LDL 151, HDL 54, TG 254, total 256. T2DM. CKD stage IIIa. DVT.  In summary, patient was previously followed by [facility] vascular.  She established care with Dr. Gollan on 04/14/2010.  She has a remote history of normal low risk Myoview  and echo demonstrating EF of 60% with no significant valvular abnormalities in April 2005.  She has a history of statin intolerance in the setting of familial hypercholesterolemia a PCSK9i.  She underwent CT cardiac scoring with a calcium score of 193.  At follow-up visit late October 2016 she reported increased palpitations/tachycardia.  She wore a 30-day event monitor which showed short episodes of A. tach and rare SVT as detailed above.  During office follow-up in early January 2022 she reported more episodes of tachycardia with increased use of propranolol  and flecainide .  Palpitations seem to occur at night with rates of 150 bpm.  Patient wore a 14-day ZIO which showed symptomatic A-fib as detailed above.   Seen in the office by Dr. Gollan on 05/11/2022 for routine follow-up. She  complained of elevated uric acid levels and rash on arms and behind knees. She had stopped Vascepa  and Praluent  secondary to concern it was contributing to gout and rash. Retrial of Praluent  was recommended if LDL elevated at recheck with PCP in March. No medication changes made.   Patient was seen in follow-up in January 2025 with reports of increased episodes of A-fib typically waking her from sleep.  She was monitoring rhythm closely with Apple Watch.  She felt episodes were decreasing and outpatient monitoring was deferred.  Upon follow-up in April 2025 she reported another increase in A-fib episodes after a recent URI.  She also noted a drop in her heart rate into the 40s.  Toprol  was decreased to 12.5 mg.  2-week ZIO demonstrated runs of SVT.    Patient was evaluated by Dr. Kitty on 08/22/2023 and decision was made to proceed with A-fib ablation which was performed on 10/03/2023.  Patient was last seen in the clinic by Chantal Needle, NP on 01/03/2024 for routine follow-up.  She continued to have A-fib episodes once a month since A-fib ablation.  Episodes typically occurring in the middle of the night and improving within 30 to 45 minutes of taking as needed flecainide .  She was referred to pulmonology for evaluation of OSA.  She was instructed to discontinue flecainide .     History of Present Illness    Today, patient is here alone. She is doing well since ablation in July. Patient denies shortness of breath, dyspnea on exertion, orthopnea or PND. She has chronic  lower extremity edema well managed with compression and Lasix . No chest pain, pressure, or tightness. She reports 3 episodes of afib since ablation in July. Her smart watch demonstrates <2% afib burden.     ROS: All other systems reviewed and are otherwise negative except as noted in History of Present Illness.  EKGs/Labs Reviewed    EKG Interpretation Date/Time:  Thursday April 04 2024 11:15:41 EST Ventricular Rate:  59 PR  Interval:  184 QRS Duration:  74 QT Interval:  428 QTC Calculation: 423 R Axis:   23  Text Interpretation: Sinus bradycardia with sinus arrhythmia with occasional Premature ventricular complexes Low voltage QRS Nonspecific T wave abnormality When compared with ECG of 03-Jan-2024 13:34, Premature ventricular complexes are now Present Premature supraventricular complexes are no longer Present QT has shortened Confirmed by Loistine Sober [phone]) on 04/04/2024 11:22:46 AM   02/29/2024: BUN 18; Creatinine, Ser 0.91; Potassium 3.4; Sodium 140   02/29/2024: Hemoglobin 13.9; WBC 4.6   11/30/2023: TSH 1.69    Risk Assessment/Calculations     CHA2DS2-VASc Score = 5   This indicates a 7.2% annual risk of stroke. The patient's score is based upon: CHF History: 0 HTN History: 1 Diabetes History: 1 Stroke History: 0 Vascular Disease History: 0 Age Score: 2 Gender Score: 1        STOP-Bang Score:  2       Physical Exam    VS:  BP 124/60 (BP Location: Left Arm, Patient Position: Sitting, Cuff Size: Normal)   Pulse (!) 59 Comment: 60 oximeter  Ht 5' 5 (1.651 m)   Wt 184 lb 6.4 oz (83.6 kg)   SpO2 98%   BMI 30.69 kg/m  , BMI Body mass index is 30.69 kg/m.  GEN: Well nourished, well developed, in no acute distress. Neck: No JVD or carotid bruits. Cardiac:  RRR.  No murmur. No rubs or gallops.   Respiratory:  Respirations regular and unlabored. Clear to auscultation without rales, wheezing or rhonchi. GI: Soft, nontender, nondistended. Extremities: Radials/DP/PT 2+ and equal bilaterally. No clubbing or cyanosis. Mild, nonpitting edema bilateral lower extremities. Compression socks in place.   Skin: Warm and dry, no rash. Neuro: Strength intact.  Assessment & Plan   PAF/atrial tachycardia 14-day Zio May 2025 demonstrated HR 41 to 203 bpm, average 57 bpm, 110 runs of SVT fastest 13 beats max rate 203 bpm, longest 22.3 seconds average rate 93 bpm, occasional PACs 1.9%, rare PVCs.  S/p  A-fib ablation 10/03/2023.  Spontaneous bleeding concerns.  Patient reports 3 episodes of afib since ablation. Her smart watch shows <2% afib burden. EKG shows sinus bradycardia with sinus arrhythmia and PVCs, 59 bpm.  -Continue as needed Toprol , Eliquis . Appropriate Eliquis  dose.   Hypertension BP today 124/60. No dizziness or headaches reported.  -Continue Toprol , losartan .    Chronic lower extremity edema Patient with history of chronic lower extremity edema that is unchanged. It is managed with Lasix  and compression. Edema is gone in the mornings and progresses throughout the day. Mild, nonpitting edema bilateral ankles. Compression socks in place. -Continue Lasix  and compression socks.   Hyperlipidemia/statin intolerance CT cardiac scoring June 2016 showed calcium score of 193.  LDL 151 December 2025, not at goal.  Patient has a history of statin intolerance.  She developed rash on Vascepa  and Praluent  and was not interested in Praluent  retrial.   -Continue Zetia .  OSA Patient underwent sleep study at the end of December. She was found to have moderate OSA and mild central  sleep apnea. She is scheduled for CPAP titration in March. She is frustrated by the process and having to wait so long to follow up for results. She does not think she will be able to tolerate the CPAP and wants to speak with her dentist about a dental device. Discussed the importance of treating sleep apnea.  - Continue to follow with sleep medicine.   Disposition: Return in 1 year or sooner as needed.          Signed, Barnie HERO. Valree Feild, DNP, NP-C  "

## 2024-04-04 ENCOUNTER — Encounter: Payer: Self-pay | Admitting: Student

## 2024-04-04 ENCOUNTER — Ambulatory Visit: Attending: Student | Admitting: Student

## 2024-04-04 VITALS — BP 124/60 | HR 59 | Ht 65.0 in | Wt 184.4 lb

## 2024-04-04 DIAGNOSIS — G4733 Obstructive sleep apnea (adult) (pediatric): Secondary | ICD-10-CM | POA: Insufficient documentation

## 2024-04-04 DIAGNOSIS — I480 Paroxysmal atrial fibrillation: Secondary | ICD-10-CM | POA: Diagnosis present

## 2024-04-04 DIAGNOSIS — Z9889 Other specified postprocedural states: Secondary | ICD-10-CM | POA: Insufficient documentation

## 2024-04-04 DIAGNOSIS — E785 Hyperlipidemia, unspecified: Secondary | ICD-10-CM | POA: Diagnosis present

## 2024-04-04 DIAGNOSIS — I10 Essential (primary) hypertension: Secondary | ICD-10-CM | POA: Diagnosis present

## 2024-04-04 DIAGNOSIS — Z8679 Personal history of other diseases of the circulatory system: Secondary | ICD-10-CM | POA: Insufficient documentation

## 2024-04-04 NOTE — Patient Instructions (Signed)
 Medication Instructions:   Your physician recommends that you continue on your current medications as directed. Please refer to the Current Medication list given to you today.    *If you need a refill on your cardiac medications before your next appointment, please call your pharmacy*  Lab Work:  None ordered at this time   If you have labs (blood work) drawn today and your tests are completely normal, you will receive your results only by:  MyChart Message (if you have MyChart) OR  A paper copy in the mail If you have any lab test that is abnormal or we need to change your treatment, we will call you to review the results.  Testing/Procedures:  None ordered at this time   Referrals:  None ordered at this time   Follow-Up:  At [facility], you and your health needs are our priority.  As part of our continuing mission to provide you with exceptional heart care, our providers are all part of one team.  This team includes your primary Cardiologist (physician) and Advanced Practice Providers or APPs (Physician Assistants and Nurse Practitioners) who all work together to provide you with the care you need, when you need it.  Your next appointment:   1 year(s)  Provider:    Evalene Lunger, MD or Barnie Hila, NP    We recommend signing up for the patient portal called MyChart.  Sign up information is provided on this After Visit Summary.  MyChart is used to connect with patients for Virtual Visits (Telemedicine).  Patients are able to view lab/test results, encounter notes, upcoming appointments, etc.  Non-urgent messages can be sent to your provider as well.   To learn more about what you can do with MyChart, go to [email or web address].

## 2024-04-18 ENCOUNTER — Other Ambulatory Visit: Payer: Self-pay | Admitting: Cardiovascular Disease

## 2024-04-18 DIAGNOSIS — I480 Paroxysmal atrial fibrillation: Secondary | ICD-10-CM

## 2024-05-01 ENCOUNTER — Other Ambulatory Visit: Payer: Self-pay | Admitting: Family Medicine

## 2024-05-01 ENCOUNTER — Other Ambulatory Visit: Payer: Self-pay | Admitting: Cardiovascular Disease

## 2024-05-01 DIAGNOSIS — I10 Essential (primary) hypertension: Secondary | ICD-10-CM

## 2024-05-29 ENCOUNTER — Ambulatory Visit: Admitting: Family Medicine

## 2024-06-05 ENCOUNTER — Ambulatory Visit (HOSPITAL_BASED_OUTPATIENT_CLINIC_OR_DEPARTMENT_OTHER): Admitting: Pulmonary Disease

## 2024-06-18 ENCOUNTER — Ambulatory Visit: Admitting: Primary Care

## 2024-06-25 ENCOUNTER — Ambulatory Visit

## 2024-06-28 ENCOUNTER — Ambulatory Visit
